# Patient Record
Sex: Male | Born: 1935
Health system: Southern US, Community
[De-identification: ages and names within clinical notes are randomized; demographics above are authoritative.]

## PROBLEM LIST (undated history)

## (undated) DIAGNOSIS — D126 Benign neoplasm of colon, unspecified: Secondary | ICD-10-CM

## (undated) DIAGNOSIS — I2510 Atherosclerotic heart disease of native coronary artery without angina pectoris: Secondary | ICD-10-CM

## (undated) DIAGNOSIS — N200 Calculus of kidney: Secondary | ICD-10-CM

## (undated) DIAGNOSIS — K589 Irritable bowel syndrome without diarrhea: Secondary | ICD-10-CM

## (undated) DIAGNOSIS — I701 Atherosclerosis of renal artery: Secondary | ICD-10-CM

## (undated) DIAGNOSIS — H8109 Meniere's disease, unspecified ear: Secondary | ICD-10-CM

## (undated) DIAGNOSIS — E785 Hyperlipidemia, unspecified: Secondary | ICD-10-CM

## (undated) DIAGNOSIS — H9190 Unspecified hearing loss, unspecified ear: Secondary | ICD-10-CM

## (undated) DIAGNOSIS — M1990 Unspecified osteoarthritis, unspecified site: Secondary | ICD-10-CM

## (undated) DIAGNOSIS — F411 Generalized anxiety disorder: Secondary | ICD-10-CM

## (undated) DIAGNOSIS — I739 Peripheral vascular disease, unspecified: Secondary | ICD-10-CM

## (undated) DIAGNOSIS — I219 Acute myocardial infarction, unspecified: Secondary | ICD-10-CM

## (undated) DIAGNOSIS — I779 Disorder of arteries and arterioles, unspecified: Secondary | ICD-10-CM

## (undated) DIAGNOSIS — I10 Essential (primary) hypertension: Secondary | ICD-10-CM

## (undated) DIAGNOSIS — I679 Cerebrovascular disease, unspecified: Secondary | ICD-10-CM

## (undated) DIAGNOSIS — R011 Cardiac murmur, unspecified: Secondary | ICD-10-CM

## (undated) DIAGNOSIS — R600 Localized edema: Secondary | ICD-10-CM

## (undated) DIAGNOSIS — G4733 Obstructive sleep apnea (adult) (pediatric): Secondary | ICD-10-CM

## (undated) DIAGNOSIS — IMO0001 Reserved for inherently not codable concepts without codable children: Secondary | ICD-10-CM

## (undated) HISTORY — PX: NM MYOVIEW LTD: HXRAD82

## (undated) HISTORY — DX: Obstructive sleep apnea (adult) (pediatric): G47.33

## (undated) HISTORY — PX: CYSTOSCOPY WITH URETEROSCOPY, STONE BASKETRY AND STENT PLACEMENT: SHX6378

## (undated) HISTORY — DX: Cerebrovascular disease, unspecified: I67.9

## (undated) HISTORY — DX: Essential (primary) hypertension: I10

## (undated) HISTORY — PX: CORONARY ANGIOPLASTY: SHX604

## (undated) HISTORY — DX: Unspecified hearing loss, unspecified ear: H91.90

## (undated) HISTORY — DX: Generalized anxiety disorder: F41.1

## (undated) HISTORY — PX: TYMPANOSTOMY TUBE PLACEMENT: SHX32

## (undated) HISTORY — DX: Reserved for inherently not codable concepts without codable children: IMO0001

## (undated) HISTORY — DX: Calculus of kidney: N20.0

## (undated) HISTORY — PX: TONSILLECTOMY: SUR1361

## (undated) HISTORY — PX: CARDIAC CATHETERIZATION: SHX172

## (undated) HISTORY — PX: CORONARY ANGIOPLASTY WITH STENT PLACEMENT: SHX49

## (undated) HISTORY — DX: Disorder of arteries and arterioles, unspecified: I77.9

## (undated) HISTORY — DX: Peripheral vascular disease, unspecified: I73.9

## (undated) HISTORY — DX: Hyperlipidemia, unspecified: E78.5

## (undated) HISTORY — DX: Benign neoplasm of colon, unspecified: D12.6

## (undated) HISTORY — DX: Irritable bowel syndrome, unspecified: K58.9

## (undated) HISTORY — DX: Localized edema: R60.0

## (undated) HISTORY — DX: Atherosclerosis of renal artery: I70.1

---

## 1998-07-06 ENCOUNTER — Ambulatory Visit (HOSPITAL_COMMUNITY): Admission: RE | Admit: 1998-07-06 | Discharge: 1998-07-06 | Payer: Self-pay | Admitting: Specialist

## 1998-07-06 ENCOUNTER — Encounter: Payer: Self-pay | Admitting: Specialist

## 1999-10-03 DIAGNOSIS — I219 Acute myocardial infarction, unspecified: Secondary | ICD-10-CM

## 1999-10-03 HISTORY — DX: Acute myocardial infarction, unspecified: I21.9

## 2000-01-13 ENCOUNTER — Inpatient Hospital Stay (HOSPITAL_COMMUNITY): Admission: EM | Admit: 2000-01-13 | Discharge: 2000-01-18 | Payer: Self-pay | Admitting: Emergency Medicine

## 2000-01-13 ENCOUNTER — Encounter: Payer: Self-pay | Admitting: Cardiology

## 2000-01-31 ENCOUNTER — Encounter (HOSPITAL_COMMUNITY): Admission: RE | Admit: 2000-01-31 | Discharge: 2000-02-27 | Payer: Self-pay | Admitting: Cardiovascular Disease

## 2003-07-13 ENCOUNTER — Inpatient Hospital Stay (HOSPITAL_COMMUNITY): Admission: EM | Admit: 2003-07-13 | Discharge: 2003-07-15 | Payer: Self-pay | Admitting: Emergency Medicine

## 2003-07-13 ENCOUNTER — Encounter: Payer: Self-pay | Admitting: Emergency Medicine

## 2003-07-30 ENCOUNTER — Observation Stay (HOSPITAL_COMMUNITY): Admission: EM | Admit: 2003-07-30 | Discharge: 2003-08-01 | Payer: Self-pay | Admitting: Emergency Medicine

## 2003-10-31 ENCOUNTER — Emergency Department (HOSPITAL_COMMUNITY): Admission: AD | Admit: 2003-10-31 | Discharge: 2003-10-31 | Payer: Self-pay | Admitting: Family Medicine

## 2005-01-13 ENCOUNTER — Emergency Department (HOSPITAL_COMMUNITY): Admission: EM | Admit: 2005-01-13 | Discharge: 2005-01-13 | Payer: Self-pay | Admitting: Emergency Medicine

## 2005-10-04 ENCOUNTER — Ambulatory Visit: Payer: Self-pay | Admitting: Pulmonary Disease

## 2005-11-03 ENCOUNTER — Ambulatory Visit: Payer: Self-pay | Admitting: Gastroenterology

## 2005-11-14 ENCOUNTER — Ambulatory Visit: Payer: Self-pay | Admitting: Gastroenterology

## 2007-01-14 ENCOUNTER — Ambulatory Visit: Payer: Self-pay | Admitting: Pulmonary Disease

## 2007-07-12 ENCOUNTER — Encounter (INDEPENDENT_AMBULATORY_CARE_PROVIDER_SITE_OTHER): Payer: Self-pay | Admitting: Otolaryngology

## 2007-07-12 ENCOUNTER — Ambulatory Visit (HOSPITAL_COMMUNITY): Admission: RE | Admit: 2007-07-12 | Discharge: 2007-07-12 | Payer: Self-pay | Admitting: Otolaryngology

## 2007-11-03 ENCOUNTER — Encounter: Admission: RE | Admit: 2007-11-03 | Discharge: 2007-11-03 | Payer: Self-pay | Admitting: Otolaryngology

## 2008-01-03 ENCOUNTER — Encounter: Payer: Self-pay | Admitting: Pulmonary Disease

## 2008-01-30 DIAGNOSIS — I10 Essential (primary) hypertension: Secondary | ICD-10-CM | POA: Insufficient documentation

## 2008-01-30 DIAGNOSIS — K589 Irritable bowel syndrome without diarrhea: Secondary | ICD-10-CM

## 2008-01-30 DIAGNOSIS — N200 Calculus of kidney: Secondary | ICD-10-CM

## 2008-01-30 DIAGNOSIS — F411 Generalized anxiety disorder: Secondary | ICD-10-CM | POA: Insufficient documentation

## 2008-01-30 DIAGNOSIS — E785 Hyperlipidemia, unspecified: Secondary | ICD-10-CM

## 2008-01-30 DIAGNOSIS — IMO0001 Reserved for inherently not codable concepts without codable children: Secondary | ICD-10-CM

## 2008-01-31 ENCOUNTER — Ambulatory Visit: Payer: Self-pay | Admitting: Internal Medicine

## 2008-02-11 ENCOUNTER — Encounter: Payer: Self-pay | Admitting: Pulmonary Disease

## 2008-04-10 ENCOUNTER — Encounter: Payer: Self-pay | Admitting: Pulmonary Disease

## 2008-06-09 ENCOUNTER — Encounter: Payer: Self-pay | Admitting: Pulmonary Disease

## 2008-07-27 ENCOUNTER — Emergency Department (HOSPITAL_COMMUNITY): Admission: EM | Admit: 2008-07-27 | Discharge: 2008-07-27 | Payer: Self-pay | Admitting: Family Medicine

## 2008-08-09 ENCOUNTER — Observation Stay (HOSPITAL_COMMUNITY): Admission: EM | Admit: 2008-08-09 | Discharge: 2008-08-10 | Payer: Self-pay | Admitting: Emergency Medicine

## 2008-08-11 ENCOUNTER — Encounter: Payer: Self-pay | Admitting: Pulmonary Disease

## 2008-08-28 ENCOUNTER — Ambulatory Visit: Payer: Self-pay | Admitting: Pulmonary Disease

## 2008-08-31 DIAGNOSIS — R42 Dizziness and giddiness: Secondary | ICD-10-CM

## 2008-10-20 ENCOUNTER — Inpatient Hospital Stay (HOSPITAL_COMMUNITY): Admission: EM | Admit: 2008-10-20 | Discharge: 2008-10-22 | Payer: Self-pay | Admitting: Emergency Medicine

## 2008-10-29 ENCOUNTER — Inpatient Hospital Stay (HOSPITAL_COMMUNITY): Admission: EM | Admit: 2008-10-29 | Discharge: 2008-10-30 | Payer: Self-pay | Admitting: Emergency Medicine

## 2008-11-11 ENCOUNTER — Ambulatory Visit: Payer: Self-pay | Admitting: Internal Medicine

## 2008-11-12 ENCOUNTER — Encounter: Payer: Self-pay | Admitting: Pulmonary Disease

## 2008-11-12 ENCOUNTER — Telehealth (INDEPENDENT_AMBULATORY_CARE_PROVIDER_SITE_OTHER): Payer: Self-pay | Admitting: *Deleted

## 2008-11-13 ENCOUNTER — Telehealth (INDEPENDENT_AMBULATORY_CARE_PROVIDER_SITE_OTHER): Payer: Self-pay | Admitting: *Deleted

## 2008-12-08 ENCOUNTER — Encounter: Payer: Self-pay | Admitting: Pulmonary Disease

## 2008-12-10 ENCOUNTER — Encounter: Payer: Self-pay | Admitting: Adult Health

## 2008-12-14 ENCOUNTER — Encounter: Payer: Self-pay | Admitting: Adult Health

## 2008-12-17 ENCOUNTER — Encounter: Payer: Self-pay | Admitting: Adult Health

## 2008-12-17 ENCOUNTER — Encounter: Admission: RE | Admit: 2008-12-17 | Discharge: 2008-12-17 | Payer: Self-pay | Admitting: Otolaryngology

## 2008-12-22 ENCOUNTER — Encounter: Payer: Self-pay | Admitting: Internal Medicine

## 2008-12-22 ENCOUNTER — Ambulatory Visit: Payer: Self-pay | Admitting: Vascular Surgery

## 2008-12-22 ENCOUNTER — Ambulatory Visit (HOSPITAL_COMMUNITY): Admission: RE | Admit: 2008-12-22 | Discharge: 2008-12-22 | Payer: Self-pay | Admitting: Internal Medicine

## 2008-12-22 ENCOUNTER — Ambulatory Visit: Payer: Self-pay | Admitting: Internal Medicine

## 2008-12-24 LAB — CONVERTED CEMR LAB
BUN: 20 mg/dL (ref 6–23)
Calcium: 9.1 mg/dL (ref 8.4–10.5)
Chloride: 105 meq/L (ref 96–112)
GFR calc non Af Amer: 78.44 mL/min (ref >60)
Potassium: 4.4 meq/L (ref 3.5–5.1)
Pro B Natriuretic peptide (BNP): 30 pg/mL (ref 0.0–100.0)
Sodium: 140 meq/L (ref 135–145)
TSH: 3.14 microintl units/mL (ref 0.35–5.50)
Total Bilirubin: 0.9 mg/dL (ref 0.3–1.2)
Total Protein: 7 g/dL (ref 6.0–8.3)

## 2008-12-25 ENCOUNTER — Telehealth (INDEPENDENT_AMBULATORY_CARE_PROVIDER_SITE_OTHER): Payer: Self-pay | Admitting: *Deleted

## 2008-12-28 ENCOUNTER — Telehealth (INDEPENDENT_AMBULATORY_CARE_PROVIDER_SITE_OTHER): Payer: Self-pay | Admitting: *Deleted

## 2008-12-30 ENCOUNTER — Ambulatory Visit: Payer: Self-pay | Admitting: Pulmonary Disease

## 2009-01-02 ENCOUNTER — Telehealth: Payer: Self-pay | Admitting: Adult Health

## 2009-01-14 ENCOUNTER — Telehealth (INDEPENDENT_AMBULATORY_CARE_PROVIDER_SITE_OTHER): Payer: Self-pay | Admitting: *Deleted

## 2009-01-20 ENCOUNTER — Ambulatory Visit: Payer: Self-pay | Admitting: Pulmonary Disease

## 2009-01-20 DIAGNOSIS — I679 Cerebrovascular disease, unspecified: Secondary | ICD-10-CM | POA: Insufficient documentation

## 2009-01-20 DIAGNOSIS — H9190 Unspecified hearing loss, unspecified ear: Secondary | ICD-10-CM | POA: Insufficient documentation

## 2009-01-20 DIAGNOSIS — I2510 Atherosclerotic heart disease of native coronary artery without angina pectoris: Secondary | ICD-10-CM

## 2009-01-20 DIAGNOSIS — I739 Peripheral vascular disease, unspecified: Secondary | ICD-10-CM

## 2009-01-20 DIAGNOSIS — I779 Disorder of arteries and arterioles, unspecified: Secondary | ICD-10-CM | POA: Insufficient documentation

## 2009-01-20 DIAGNOSIS — D126 Benign neoplasm of colon, unspecified: Secondary | ICD-10-CM

## 2009-01-28 ENCOUNTER — Telehealth (INDEPENDENT_AMBULATORY_CARE_PROVIDER_SITE_OTHER): Payer: Self-pay | Admitting: *Deleted

## 2009-02-08 ENCOUNTER — Telehealth: Payer: Self-pay | Admitting: Pulmonary Disease

## 2009-02-09 ENCOUNTER — Encounter: Payer: Self-pay | Admitting: Pulmonary Disease

## 2009-03-05 ENCOUNTER — Telehealth (INDEPENDENT_AMBULATORY_CARE_PROVIDER_SITE_OTHER): Payer: Self-pay | Admitting: *Deleted

## 2009-03-08 ENCOUNTER — Encounter: Payer: Self-pay | Admitting: Pulmonary Disease

## 2009-03-16 ENCOUNTER — Ambulatory Visit: Payer: Self-pay | Admitting: Pulmonary Disease

## 2009-03-18 ENCOUNTER — Telehealth (INDEPENDENT_AMBULATORY_CARE_PROVIDER_SITE_OTHER): Payer: Self-pay | Admitting: *Deleted

## 2009-03-23 LAB — CONVERTED CEMR LAB
Calcium: 9.1 mg/dL (ref 8.4–10.5)
Creatinine, Ser: 0.9 mg/dL (ref 0.4–1.5)
GFR calc non Af Amer: 88.52 mL/min (ref >60)
Glucose, Bld: 93 mg/dL (ref 70–99)
Potassium: 3.9 meq/L (ref 3.5–5.1)
Pro B Natriuretic peptide (BNP): 44 pg/mL (ref 0.0–100.0)
Sodium: 140 meq/L (ref 135–145)

## 2009-04-26 ENCOUNTER — Telehealth (INDEPENDENT_AMBULATORY_CARE_PROVIDER_SITE_OTHER): Payer: Self-pay | Admitting: *Deleted

## 2009-05-10 ENCOUNTER — Encounter: Payer: Self-pay | Admitting: Adult Health

## 2009-05-19 ENCOUNTER — Ambulatory Visit: Payer: Self-pay | Admitting: Pulmonary Disease

## 2009-06-10 ENCOUNTER — Ambulatory Visit: Payer: Self-pay | Admitting: Endocrinology

## 2009-06-21 ENCOUNTER — Encounter: Payer: Self-pay | Admitting: Endocrinology

## 2009-06-23 ENCOUNTER — Telehealth: Payer: Self-pay | Admitting: Endocrinology

## 2009-06-26 LAB — CONVERTED CEMR LAB
Catecholamines Tot(E+NE) 24 Hr U: 0.232 mg/24hr — ABNORMAL HIGH
Metanephrines, Ur: 290 (ref 90–315)
Norepinephrine 24 Hr Urine: 214 mcg/24hr — ABNORMAL HIGH (ref <80)

## 2009-07-19 ENCOUNTER — Telehealth (INDEPENDENT_AMBULATORY_CARE_PROVIDER_SITE_OTHER): Payer: Self-pay | Admitting: *Deleted

## 2009-07-23 ENCOUNTER — Ambulatory Visit: Payer: Self-pay | Admitting: Internal Medicine

## 2009-08-04 ENCOUNTER — Encounter: Admission: RE | Admit: 2009-08-04 | Discharge: 2009-08-24 | Payer: Self-pay | Admitting: Pulmonary Disease

## 2009-08-12 ENCOUNTER — Encounter: Payer: Self-pay | Admitting: Internal Medicine

## 2009-08-17 ENCOUNTER — Encounter (HOSPITAL_COMMUNITY): Admission: RE | Admit: 2009-08-17 | Discharge: 2009-10-01 | Payer: Self-pay | Admitting: Internal Medicine

## 2009-08-23 ENCOUNTER — Ambulatory Visit: Payer: Self-pay | Admitting: Pulmonary Disease

## 2009-08-24 ENCOUNTER — Encounter: Payer: Self-pay | Admitting: Pulmonary Disease

## 2009-09-02 ENCOUNTER — Encounter: Payer: Self-pay | Admitting: Pulmonary Disease

## 2009-09-21 ENCOUNTER — Telehealth (INDEPENDENT_AMBULATORY_CARE_PROVIDER_SITE_OTHER): Payer: Self-pay | Admitting: *Deleted

## 2009-10-13 ENCOUNTER — Encounter: Payer: Self-pay | Admitting: Endocrinology

## 2009-12-02 ENCOUNTER — Encounter: Payer: Self-pay | Admitting: Endocrinology

## 2009-12-02 ENCOUNTER — Ambulatory Visit: Payer: Self-pay | Admitting: Pulmonary Disease

## 2009-12-03 ENCOUNTER — Telehealth (INDEPENDENT_AMBULATORY_CARE_PROVIDER_SITE_OTHER): Payer: Self-pay | Admitting: *Deleted

## 2009-12-03 LAB — CONVERTED CEMR LAB
ALT: 45 units/L (ref 0–53)
Basophils Absolute: 0 10*3/uL (ref 0.0–0.1)
Basophils Relative: 0 % (ref 0–1)
Bilirubin, Direct: 0.3 mg/dL (ref 0.0–0.3)
CO2: 22 meq/L (ref 19–32)
Calcium: 8.6 mg/dL (ref 8.4–10.5)
Creatinine, Ser: 1.25 mg/dL (ref 0.40–1.50)
Glucose, Bld: 117 mg/dL — ABNORMAL HIGH (ref 70–99)
Hemoglobin: 16.1 g/dL (ref 13.0–17.0)
MCHC: 35.5 g/dL (ref 30.0–36.0)
MCV: 89.5 fL (ref 78.0–100.0)
Monocytes Relative: 13 % — ABNORMAL HIGH (ref 3–12)
Neutrophils Relative %: 58 % (ref 43–77)
Potassium: 3.8 meq/L (ref 3.5–5.3)
RBC: 5.06 M/uL (ref 4.22–5.81)
RDW: 12.5 % (ref 11.5–15.5)
Total Bilirubin: 1.3 mg/dL — ABNORMAL HIGH (ref 0.3–1.2)
Total Protein: 7.3 g/dL (ref 6.0–8.3)

## 2009-12-30 IMAGING — CT CT PELVIS W/ CM
2 of 5 series · 17 of 46 positions shown, 19 images · IV contrast (READICAT/WATER & [ID] OMNI 300)
Comparison: None

CT ABDOMEN

CLINICAL DATA: Dizziness and elevated type:  Levels.  Evaluate for
pheochromocytoma.

CT ABDOMEN AND PELVIS WITH CONTRAST
TECHNIQUE: Multidetector CT imaging of the abdomen and pelvis was
performed using the standard protocol following bolus
administration of intravenous contrast.
Contrast: 100 ml Jmnipaque-W66 and oral contrast

[Series 2: abdomen w/ · axial · 0.76mm/px · z∈[-343,+67]mm · 14 of 92 slices shown, 16 images]
[im 5/92  soft-tissue]
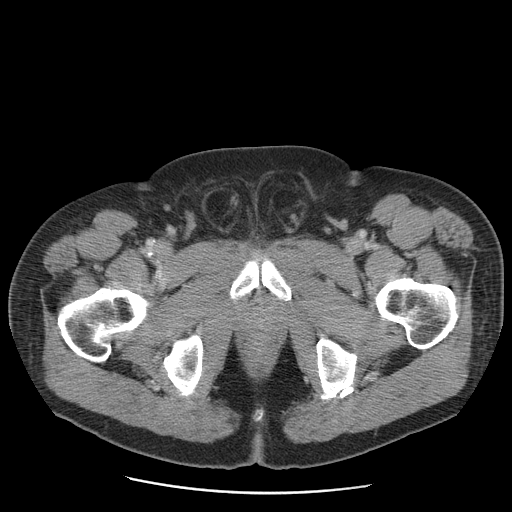
[im 5/92  bone]
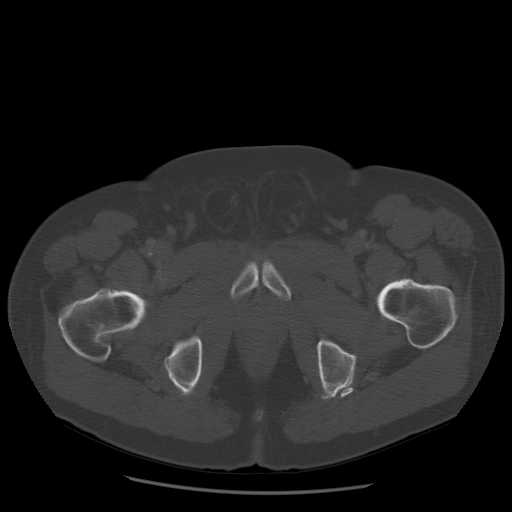
[im 10/92  soft-tissue]
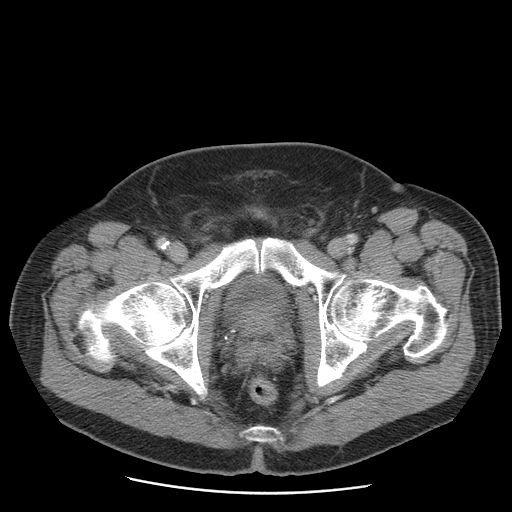
[im 20/92  soft-tissue]
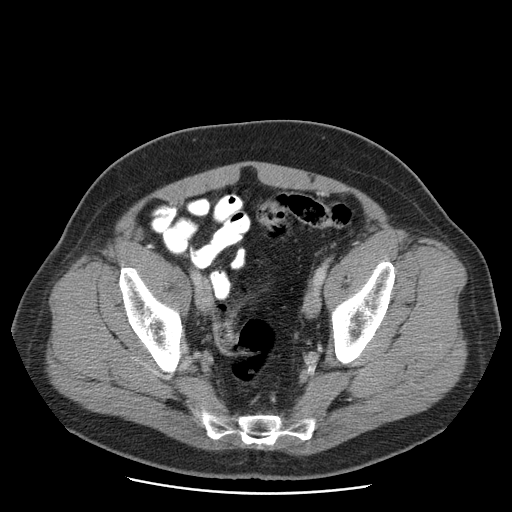
[im 24/92  soft-tissue]
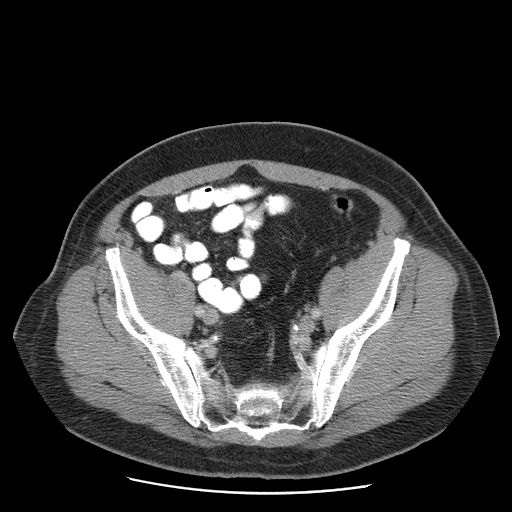
[im 29/92  soft-tissue]
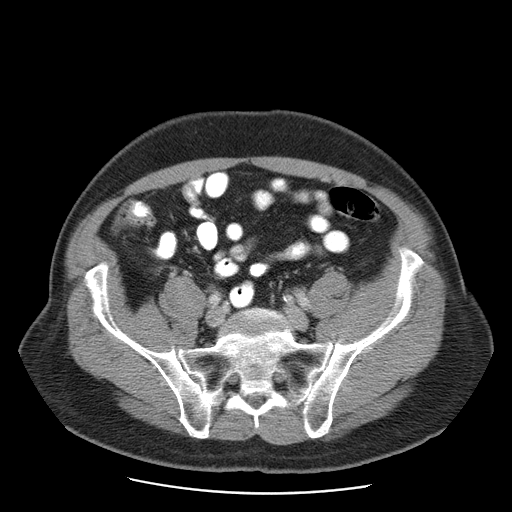
[im 39/92  soft-tissue]
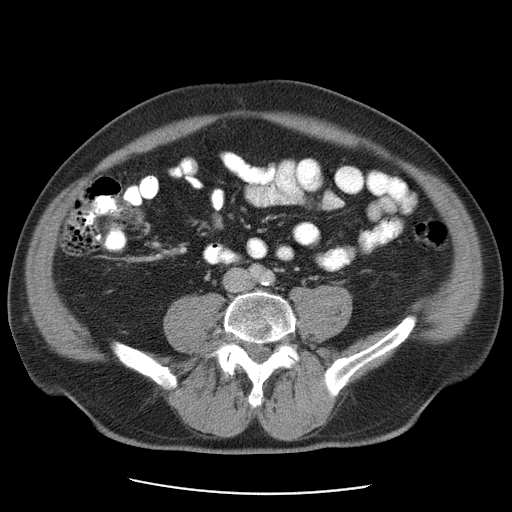
[im 44/92  soft-tissue]
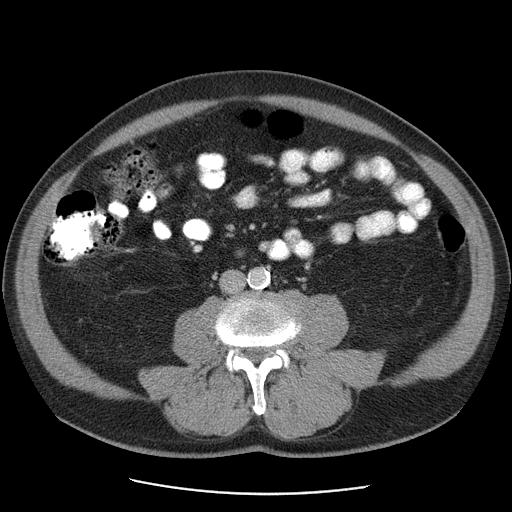
[im 48/92  soft-tissue]
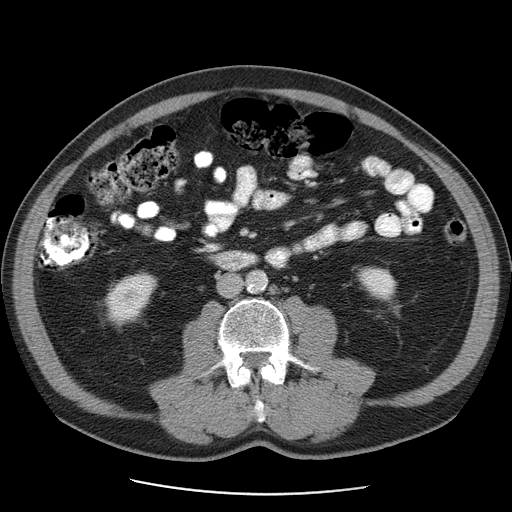
[im 53/92  soft-tissue]
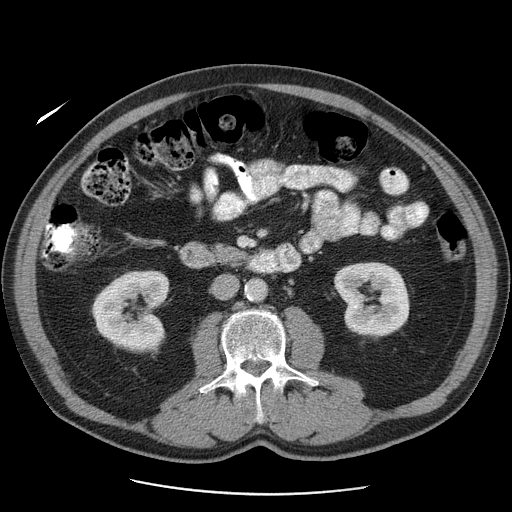
[im 53/92  bone]
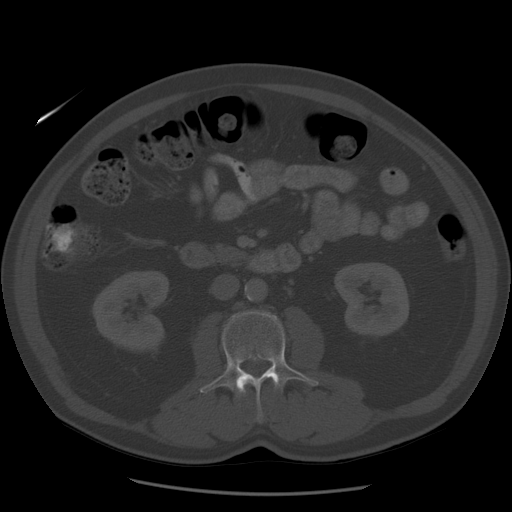
[im 63/92  soft-tissue]
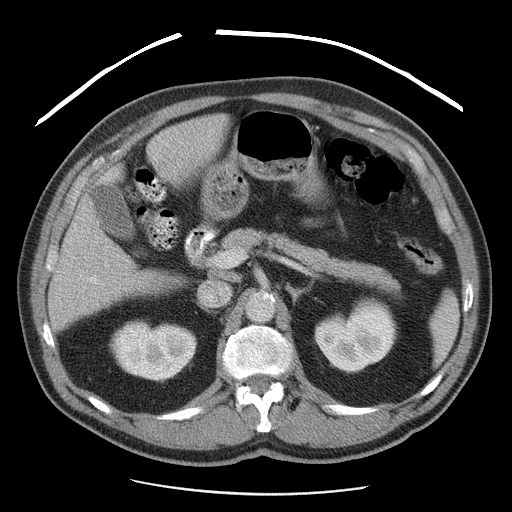
[im 68/92  soft-tissue]
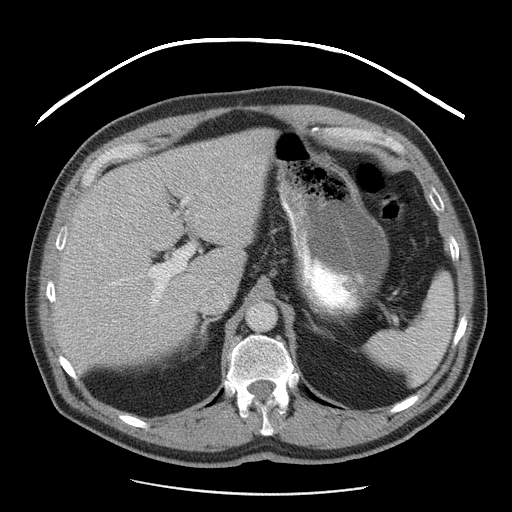
[im 72/92  soft-tissue]
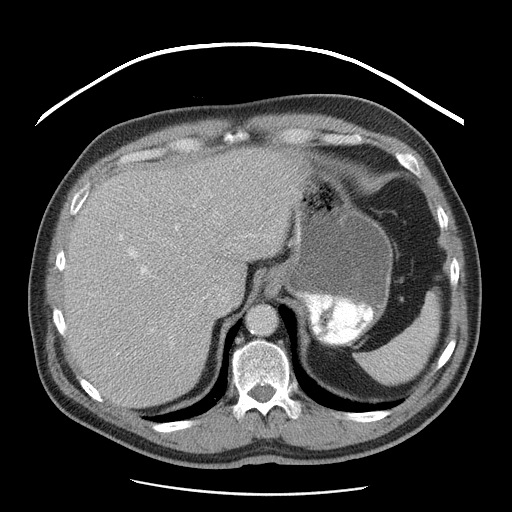
[im 82/92  soft-tissue]
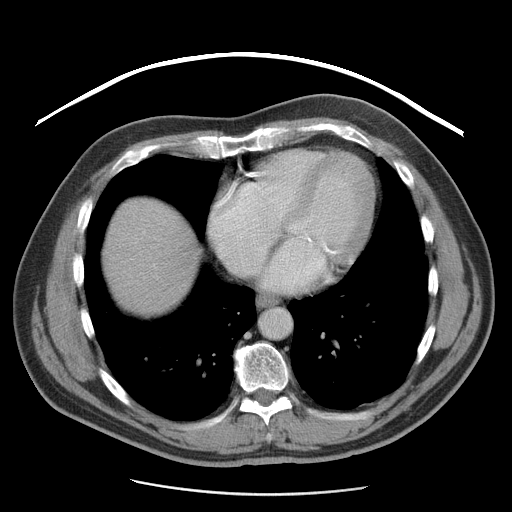
[im 87/92  soft-tissue]
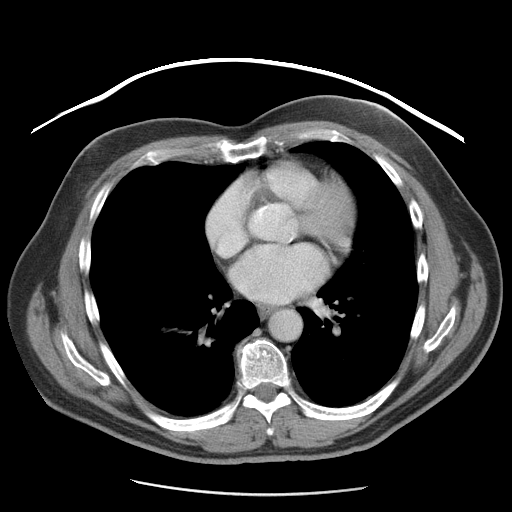

[Series 400: cor abd · coronal · 0.96mm/px · 3 of 143 slices shown]
[im 48/143  soft-tissue]
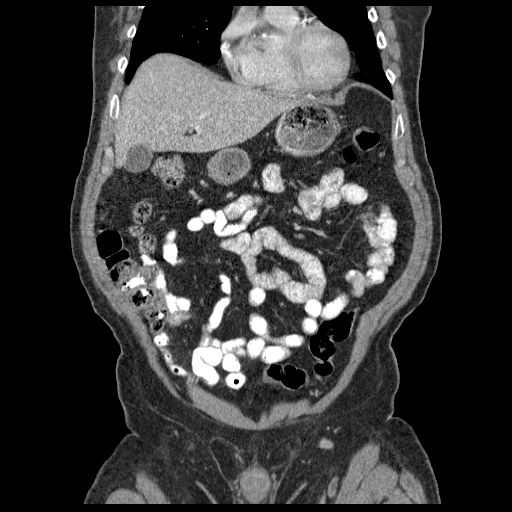
[im 64/143  soft-tissue]
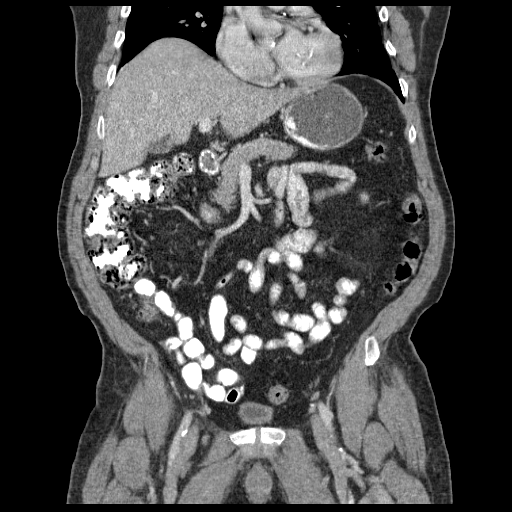
[im 79/143  soft-tissue]
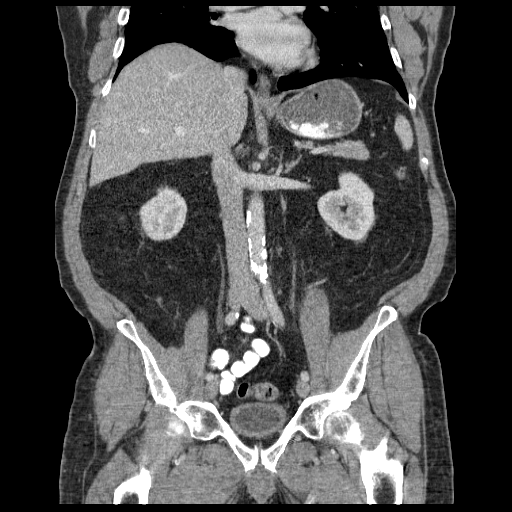

[17 of 46 positions shown; findings below may reference images not displayed]

FINDINGS: Both adrenal glands are normal in size in appearance.
There is no evidence of adrenal mass or abnormal adrenal
hypertrophy.  No other retroperitoneal soft tissue masses are
identified.  There is no evidence of lymphadenopathy.

The liver, gallbladder, pancreas, spleen, and kidneys are normal in
appearance.  There is no evidence of soft tissue masses or
inflammatory process.  No abnormal fluid collections are seen.
Abdominal bowel loops are unremarkable in appearance.
IMPRESSION: Negative abdomen CT.  No evidence of adrenal or other
retroperitoneal mass.

CT PELVIS
FINDINGS: There is no evidence of retroperitoneal mass within the
pelvis.  No other pelvic soft tissue masses are identified.
Urinary bladder is unremarkable appearance.  There is no evidence
of lymphadenopathy.

No inflammatory process or abnormal fluid collections are
identified.  Pelvic bowel loops are unremarkable in appearance.
IMPRESSION: Negative pelvis CT.  No evidence of retroperitoneal or other pelvic
mass.

## 2010-03-15 ENCOUNTER — Telehealth: Payer: Self-pay | Admitting: Pulmonary Disease

## 2010-03-17 ENCOUNTER — Ambulatory Visit: Payer: Self-pay | Admitting: Pulmonary Disease

## 2010-03-23 ENCOUNTER — Ambulatory Visit: Payer: Self-pay | Admitting: Pulmonary Disease

## 2010-03-23 LAB — CONVERTED CEMR LAB
AST: 40 units/L — ABNORMAL HIGH (ref 0–37)
Albumin: 3.8 g/dL (ref 3.5–5.2)
Bilirubin, Direct: 0.2 mg/dL (ref 0.0–0.3)
Chloride: 102 meq/L (ref 96–112)
Cholesterol: 166 mg/dL (ref 0–200)
Creatinine, Ser: 1 mg/dL (ref 0.4–1.5)
Eosinophils Relative: 5.5 % — ABNORMAL HIGH (ref 0.0–5.0)
GFR calc non Af Amer: 80.01 mL/min (ref >60)
HCT: 40.7 % (ref 39.0–52.0)
Hemoglobin: 14.3 g/dL (ref 13.0–17.0)
Lymphs Abs: 2.6 10*3/uL (ref 0.7–4.0)
MCV: 93.9 fL (ref 78.0–100.0)
Monocytes Absolute: 0.8 10*3/uL (ref 0.1–1.0)
Monocytes Relative: 9.3 % (ref 3.0–12.0)
PSA: 0.16 ng/mL (ref 0.10–4.00)
Platelets: 291 10*3/uL (ref 150.0–400.0)
RDW: 12.8 % (ref 11.5–14.6)
Sodium: 139 meq/L (ref 135–145)
Total Bilirubin: 1.6 mg/dL — ABNORMAL HIGH (ref 0.3–1.2)
Triglycerides: 425 mg/dL — ABNORMAL HIGH (ref 0.0–149.0)
WBC: 8.9 10*3/uL (ref 4.5–10.5)

## 2010-04-26 ENCOUNTER — Encounter: Payer: Self-pay | Admitting: Pulmonary Disease

## 2010-09-06 ENCOUNTER — Encounter: Payer: Self-pay | Admitting: Pulmonary Disease

## 2010-10-26 ENCOUNTER — Encounter: Payer: Self-pay | Admitting: Gastroenterology

## 2010-11-03 NOTE — Miscellaneous (Signed)
Summary: Initial Assessment for PT Services/[facility]  Initial Assessment for PT Services/[facility]   Imported By: Maryln Gottron 10/05/2009 10:53:40  _____________________________________________________________________  External Attachment:    Type:   Image     Comment:   External Document

## 2010-11-03 NOTE — Letter (Signed)
Summary: [facility]  [facility]   Imported By: Lennie Odor 05/03/2010 14:08:23  _____________________________________________________________________  External Attachment:    Type:   Image     Comment:   External Document

## 2010-11-03 NOTE — Progress Notes (Signed)
Summary: LABS  Phone Note Call from Patient Call back at [phone]   Caller: Patient Call For: Allani Reber Summary of Call: NEED LABS FOR CPX PUT IN . PT WOULD LIKE TO COME IN THIS WEEK TO HAVE THEM DONE. Initial call taken by: Rickard Patience,  March 15, 2010 9:50 AM  Follow-up for Phone Call        Pt has appt for cpx with SN for 6/22 at 3:30 pm.  Please advise if you want him to come in sooner for fasting labs, thanks! Follow-up by: Vernie Murders,  March 15, 2010 9:56 AM  Additional Follow-up for Phone Call Additional follow up Details #1::        ok per SN for labs prior to appt.---these have been put in the computer for pt for this week. pt is aware Randell Loop CMA  March 15, 2010 11:23 AM

## 2010-11-03 NOTE — Assessment & Plan Note (Signed)
Summary: cpx/ok per [facility]/apc   Primary Care Provider:  Kriste Basque  CC:  14 month ROV & review of mult medical problems....  History of Present Illness: 75 y/o WM here for a follow up visit... he has multiple medical problems as noted below...     ~  Apr10:  I last saw him 4/08 (on that occas for refill of Chlorazepate)... he has mult medical problems as listed below... recently he has had an extensive eval from the Cardiovasc perspective by DrBerry, and from the ENT standpoint from [facility]...    Current Problems:   DIZZINESS (ICD-780.4) & HEARING LOSS (ICD-389.9) - see extensive eval by Rosine Beat 3/10 w/ Audiogram showing high freq loss that was similar to study in 7/09... Labs essent norm x increased catecholamines on 24H urine (?poss related to meds & off Imipramine now)... MRI Brain w/ small vessel disease otherw neg... CT Abd neg- normal adrenals bilat... he was given a course of oral PRED- not much change in hearing & f/u w/ DrKraus > prob Meniere's disease- tube placed in right ear 6/10, started on Aldactazide, uses Meclizine & Valium Prn... he did see DrLove 1/11 who agreed w/ the Dx.  HYPERTENSION (ICD-401.9) - controllled on meds per [facility]= METOPROLOL 50mg - 1.5 tabsBid, & ALDACTAZIDE 25-25 taken Bid... BP= 124/76 & he denies HA, visual symptoms, CP, palpit, syncope, etc... prev mild LE edema resolved w/ the diuretic...  ~  2DEcho 7/09 by [facility] was normal...  CORONARY ARTERY DISEASE (ICD-414.00) - on ASA 81mg /d + PLAVIX 75mg /d... known CAD followed by [facility]= he had both diagonal branch & RCA stents placed in 2004, and f/u cath Jan10 showed patent stents...  ~  Myoview 11/09 by [facility] was negative...  ~  Event recorder as part of his dizziness work up was reported no signif arrhythmias.  CEREBROVASCULAR DISEASE (ICD-437.9) - hx of carotid disease followed by [facility] w/ CDopplers...  ~  CDopplers w/ 60-79% right carotid stenosis... continues on ASA/ Plavix.  PERIPHERAL VASCULAR DISEASE  (ICD-443.9) - known mild RAS followed by ultrasounds at the [facility] office...  ~  Dopplers w/ 40% bilat RAS as noted...  HYPERLIPIDEMIA (ICD-272.4) - on LIPITOR 80mg /d, ZETIA 10mg /d, FISH OIL 1000mg Bid...  ~  FLP's followed by [facility] (we don't have recent numbers to review).  ~  FLP 6/11 showed TChol 166, TG 425, HDL 32, LDL 78... may need fibrate- defer to [facility].  IRRITABLE BOWEL SYNDROME (ICD-564.1) & COLONIC POLYPS (ICD-211.3) - followed for GI by DrStark & last colonoscopy was 2/07 showing mult 3-44mm polyps= tubular adenomas and f/u plasnned 36yrs.  NEPHROLITHIASIS (ICD-592.0) - he is followed for Urology by DrGrapey and the pt tells me that he does his routine PSA screening as well- ?when this was done last?  ~  labs here 6/11 showed PSA= 0.16  FIBROMYALGIA (ICD-729.1) - he has carried this diagnosis for yrs based on clinical symptoms and was treated w/ Imipramine 25mg  Bid until it was discontinued 3/10...  ANXIETY (ICD-300.00) - on DIAZEPAM 5mg - 1/2 to 1 tab by mouth three times a day Prn... he reports a prev reaction to Zoloft.  * Hx of ABNORMAL URINARY CATECHOL/METANEPHRINES - eval by DrKraus, DrEllison, DrLove> CT Abd was neg & MIBG scan was also neg- no adrenal lesions seen... poss related to Imipramine Rx  Health Maintenance - he had a TETANUS shot in 2003... he had PNEUMOVAX in 2007... he also received the SHINGLES vaccine in Oct08... he also takes MVI, Protegra, Vit C...   Preventive Screening-Counseling & Management  Alcohol-Tobacco     Smoking Status: quit     Year Quit: 1966  Allergies: 1)  Zoloft  Comments:  Nurse/Medical Assistant: The patient's medications and allergies were reviewed with the patient and were updated in the Medication and Allergy Lists.  Past History:  Past Medical History: Hx of DIZZINESS (ICD-780.4) HEARING LOSS (ICD-389.9) HYPERTENSION (ICD-401.9) CORONARY ARTERY DISEASE (ICD-414.00) CEREBROVASCULAR DISEASE (ICD-437.9) PERIPHERAL VASCULAR  DISEASE (ICD-443.9) HYPERLIPIDEMIA (ICD-272.4) IRRITABLE BOWEL SYNDROME (ICD-564.1) COLONIC POLYPS (ICD-211.3) NEPHROLITHIASIS (ICD-592.0) FIBROMYALGIA (ICD-729.1) ANXIETY (ICD-300.00) * Hx of ABNORMAL URINARY CATECHOL/METANEPHRINES - believed related to Imipramine Rx  Past Surgical History: S/P tube in right ear 6/10 by DrKraus  Family History: Reviewed history from 05/19/2009 and no changes required. heart disease - 1 brother has defibrillator, father had CHF mother died of stroke lung cancer in 1 brother, sister has breast cancer  Social History: Reviewed history from 12/02/2009 and no changes required. Married former smoker.  quit in 1966 x80yrs, 1ppd. 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  The patient denies anorexia, fever, weight loss, vision loss, decreased hearing, hoarseness, chest pain, syncope, peripheral edema, prolonged cough, headaches, hemoptysis, abdominal pain, melena, hematochezia, severe indigestion/heartburn, hematuria, incontinence, muscle weakness, suspicious skin lesions, transient blindness, difficulty walking, depression, unusual weight change, abnormal bleeding, enlarged lymph nodes, and angioedema.         He notes some breast tissue developement likely related to Aldactazide Rx...  Vital Signs:  Patient profile:   75 year old male Height:      68 inches Weight:      208 pounds BMI:     31.74 O2 Sat:      96 % on Room air Temp:     98.6 degrees F oral Pulse rate:   73 / minute BP sitting:   124 / 76  (right arm) Cuff size:   regular  Vitals Entered By: Randell Loop CMA (March 23, 2010 3:21 PM)  O2 Sat at Rest %:  96 O2 Flow:  Room air CC: 14 month ROV & review of mult medical problems... Is Patient Diabetic? No Pain Assessment Patient in pain? no      Comments no changes in meds today     Physical Exam  Additional Exam:  WD, sl obese, 75 y/o WM in NAD... he has gained 24# to 208#... GENERAL:  Alert & oriented; pleasant & cooperative... HEENT:  Fleming/AT, EOM-wnl, PERRLA, Fundi-benign, EACs-clear, TMs-wnl, NOSE-clear, THROAT-clear & wnl. NECK:  Supple w/ fairROM; no JVD; normal carotid impulses w/o bruits; no thyromegaly or nodules palpated; no lymphadenopathy. CHEST:  Clear to P & A; without wheezes/ rales/ or rhonchi. Bilat mild gynecomastia is present, no galactorrhea... HEART:  Regular Rhythm; without murmurs/ rubs/ or gallops. ABDOMEN:  Soft & nontender; normal bowel sounds; no organomegaly or masses detected. EXT: without deformities, mild arthritic changes; no varicose veins/ venous insuffic/ or edema. NEURO:  CN's intact; motor testing normal; sensory testing normal; gait normal & balance OK. DERM:  No lesions noted; no rash etc...    MISC. Report  Procedure date:  03/17/2010  Findings:      Lipid Panel (LIPID)   Cholesterol               166 mg/dL  0-200   Triglycerides        [H]  425.0 mg/dL                 1.6-109.6   HDL                  [L]  04.54 mg/dL                 >09.81 Cholesterol LDL - Direct                             78.6 mg/dL  BMP (METABOL)   Sodium                    139 mEq/L                   135-145   Potassium                 4.1 mEq/L                   3.5-5.1   Chloride                  102 mEq/L                   96-112   Carbon Dioxide            29 mEq/L                    19-32   Glucose              [H]  109 mg/dL                   19-14   BUN                       22 mg/dL                    7-82   Creatinine                1.0 mg/dL                   9.5-6.2   Calcium                   9.1 mg/dL                   1.3-08.6   GFR                       80.01 mL/min                >60  Hepatic/Liver Function Panel (HEPATIC)   Total Bilirubin      [H]  1.6 mg/dL                   5.7-8.4   Direct Bilirubin           0.2 mg/dL                   6.9-6.2   Alkaline Phosphatase      64 U/L                      39-117   AST                  [  H]  40 U/L                      0-37   ALT                       46 U/L                      0-53   Total Protein             6.7 g/dL                    0.2-5.8   Albumin                   3.8 g/dL                    5.2-7.7  Comments:      CBC Platelet w/Diff (CBCD)   White Cell Count          8.9 K/uL                    4.5-10.5   Red Cell Count            4.33 Mil/uL                 4.22-5.81   Hemoglobin                14.3 g/dL                   82.4-23.5   Hematocrit                40.7 %                      39.0-52.0   MCV                       93.9 fl                     78.0-100.0   Platelet Count            291.0 K/uL                  150.0-400.0   Neutrophil %              55.1 %                      43.0-77.0   Lymphocyte %              29.6 %                      12.0-46.0   Monocyte %                9.3 %                       3.0-12.0   Eosinophils%         [H]  5.5 %                       0.0-5.0   Basophils %               0.5 %  0.0-3.0  TSH (TSH)   FastTSH                   1.92 uIU/mL                 0.35-5.50  Prostate Specific Antigen (PSA)   PSA-Hyb                   0.16 ng/mL                  0.10-4.00   CXR  Procedure date:  03/23/2010  Findings:      CHEST - 2 VIEW Comparison: 10/19/2008 and earlier studies   Findings: Lungs clear.  Heart size and pulmonary vascularity normal.  No effusion.  Visualized bones unremarkable.   IMPRESSION: No acute disease   Read By:  Corlis Leak. Reuel Boom,  M.D.    Impression & Recommendations:  Problem # 1:  PHYSICAL EXAMINATION (ICD-V70.0) Recent labs reviewed w/ pt & wife today... CXR done> clear, WNL...  T-2 View CXR (71020TC)  Problem # 2:  DIZZINESS (ICD-780.4) Improved overall...  His updated medication list for this problem includes:    Meclizine  Hcl 25 Mg Tabs (Meclizine hcl) .Marland Kitchen... 1 every 8 hours as needed for dizziness  Problem # 3:  HYPERTENSION (ICD-401.9) Controlled on meds>  but may need to switch off Aldactazide due to gynecomastia... he will discuss w/ DrBerry at ntheir up coming OV. His updated medication list for this problem includes:    Metoprolol Tartrate 50 Mg Tabs (Metoprolol tartrate) .Marland Kitchen... 1.5 tabs by mouth two times a day    Aldactazide 25-25 Mg Tabs (Spironolactone-hctz) .Marland Kitchen... 1 by mouth two times a day  Problem # 4:  CORONARY ARTERY DISEASE (ICD-414.00) Aware>  he appears stable & denies angina, working security job part time... His updated medication list for this problem includes:    Adult Aspirin Low Strength 81 Mg Tbdp (Aspirin) .Marland Kitchen... Take 1 by mouth once daily    Plavix 75 Mg Tabs (Clopidogrel bisulfate) .Marland Kitchen... Take 1 by mouth once daily    Metoprolol Tartrate 50 Mg Tabs (Metoprolol tartrate) .Marland Kitchen... 1.5 tabs by mouth two times a day    Aldactazide 25-25 Mg Tabs (Spironolactone-hctz) .Marland Kitchen... 1 by mouth two times a day  Problem # 5:  CEREBROVASCULAR DISEASE (ICD-437.9) Followed by [facility] w/ serial CDopplers...  Problem # 6:  HYPERLIPIDEMIA (ICD-272.4) FLP shows incr TG in assoc w/ 24# weight gain... on Lip + Zetia & may need meds adjusted but for now low fat weight reducing diet & incr exercise discussed. His updated medication list for this problem includes:    Lipitor 80 Mg Tabs (Atorvastatin calcium) .Marland Kitchen... Take 1 by mouth once daily    Zetia 10 Mg Tabs (Ezetimibe) .Marland Kitchen... Take 1 by mouth once daily  Problem # 7:  COLONIC POLYPS (ICD-211.3) GI is stable & f/u colon due 2/12 by DrStark.  Problem # 8:  ANXIETY (ICD-300.00) He continues on the Valium for Meniere's and anxiety... His updated medication list for this problem includes:    Diazepam 5 Mg Tabs (Diazepam) .Marland Kitchen... 1/2 - 1 tabs by mouth three times a day as needed  Problem # 9:  OTHER MEDICAL PROBLEMS AS NOTED>>> He has some gynecomastia likely related  to the Aldactazide but doesn't want to stop until he's had a chance to discuss w/ DrBerry.  Complete Medication List: 1)  Meclizine Hcl 25 Mg Tabs (Meclizine hcl) .Marland Kitchen.. 1 every 8 hours as needed for  dizziness 2)  Adult Aspirin Low Strength 81 Mg Tbdp (Aspirin) .... Take 1 by mouth once daily 3)  Plavix 75 Mg Tabs (Clopidogrel bisulfate) .... Take 1 by mouth once daily 4)  Metoprolol Tartrate 50 Mg Tabs (Metoprolol tartrate) .... 1.5 tabs by mouth two times a day 5)  Aldactazide 25-25 Mg Tabs (Spironolactone-hctz) .Marland Kitchen.. 1 by mouth two times a day 6)  Lipitor 80 Mg Tabs (Atorvastatin calcium) .... Take 1 by mouth once daily 7)  Zetia 10 Mg Tabs (Ezetimibe) .... Take 1 by mouth once daily 8)  Fish Oil 1000 Mg Caps (Omega-3 fatty acids) .... Take 1 capsule by mouth two times a day 9)  Prevacid 15 Mg Cpdr (Lansoprazole) .... Take 1 capsule by mouth once a day 10)  Multivitamins Tabs (Multiple vitamin) .... Take 1 by mouth once daily 11)  Protegra Caps (Multiple vitamins-minerals) .... Take 1 by mouth once daily 12)  Vitamin C 1000 Mg Tabs (Ascorbic acid) .... Take 1 by mouth two times a day 13)  Diazepam 5 Mg Tabs (Diazepam) .... 1/2 - 1 tabs by mouth three times a day as needed  Patient Instructions: 1)  Today we updated your med list- see below.... 2)  Today we reviewed your recent fasting blood work, and we did your f/u CXR.Marland KitchenMarland Kitchen 3)  please call the "phone tree" in a few days for your XRay results.Marland KitchenMarland Kitchen 4)  Call for any questions.Marland KitchenMarland Kitchen 5)  Please schedule a follow-up appointment in 1 year, sooner as needed.   Immunization History:  Influenza Immunization History:    Influenza:  historical (07/07/2009)

## 2010-11-03 NOTE — Letter (Signed)
Summary: Colonoscopy Letter  [facility]  [address] [place], [place] [address]   Phone: [phone]  Fax: [phone]      October 26, 2010 MRN: [id number]   [facility] [address] [place], [place]  [address]   Dear Mr. Plyler,   According to your medical record, it is time for you to schedule a Colonoscopy. The American Cancer Society recommends this procedure as a method to detect early colon cancer. Patients with a family history of colon cancer, or a personal history of colon polyps or inflammatory bowel disease are at increased risk.  This letter has been generated based on the recommendations made at the time of your procedure. If you feel that in your particular situation this may no longer apply, please contact our office.  Please call our office at [phone] to schedule this appointment or to update your records at your earliest convenience.  Thank you for cooperating with [place] to provide you with the very best care possible.   Sincerely,  Judie Petit T. Russella Dar, M.D.  [facility] [phone]

## 2010-11-03 NOTE — Assessment & Plan Note (Signed)
Summary: Acute NP office visit - n/d   Copy to:  Gini Caputo Primary Provider/Referring Provider:  Kriste Basque  CC:  was started on zoloft at last OV, after 3 nights became disoriented so pt dc'd and went back on diazepam.  also c/o weakness, no energy, and nausea/diarrhea x4days - denies f/c/s.Eric [name]  History of Present Illness: 75 year old male with known history of multiple medical conditions including,  HTN, CAD Hyperlipidemia, IBS    08/28/08--saw cardiology 08/09/08 for "hot" spells, dizziness/lightheaded; with negative work up. episode of feeling hot lightheaded diaphoresis, nausea --went to ER--admitted neg w/u card w/u neg with cardiolite.Eric [name] Has had no further episodes since    November 11, 2008--returns for follow up. pt states this is to refer him to Dr. Dorma Russell for dizziness.  Pt has been hospitalized twice in last month 1/19-21 and 10/29/2008-  10/30/2008 for Dizziness of undetermined etiology w/ presyncopal episodes.  Recently, has been admitted for evaluation of intermittent dizziness.  He was just discharged on October 22, 2008, after a syncopal spell associated with sinus tachycardia.  His symptoms seemed to improve with meclizine as an outpatient.  He had another episode at home on October 29, 2008.  He had a  a monitor on, but unfortunately, it is a self-activated monitor and he did not activated.  He was admitted to telemetry for further evaluation.  His enzymes are negative.  Since discharge still has episodes of lightheadness. no syncopal episodes. He is only using meclizine  a few times. No headache, visual/speech changes. pt underwent Cardiac catheterization revealed stable patent stents, EF 60%.   December 22, 2008 new problem foot/leg swelling  right then left x 1 week on plendil (though didn't disclose it, gets it from cards).  --venous doppler neg.  December 30, 2008--Returns for follow up of dizzines and discuss med changes. Has been seeing Dr. Jac Canavan who has done an extensive work up.  Spoke w/ Dr. Cathe Mons concerning his workup Has undergone MRI head w/o acute changes, chronic vessel dz. Pt b/p was elevated. Pt was sent for 24 hr urine for catecholamine and catecholamine levels which were elevated. Epinephrine 864 (nl-<420) total catecholamines 911 (nl-<504). 24 hr urine norepinephrine elevated 175 (nl <80). Sent for CT abd/pelvis neg for adrenal mass. Using meclizine w/ some relief but has had 2 episodes since last over last 2 weeks w/ severe dizziness, weakness to point he is unable to function. Has vertigo test set up for next weeks. Discussed possible med side effects w/ Dr. Jac Canavan w/ imipramine potential for dizziness and catecholamine rise. Suggested to taper off of slowly. Continue on diuretic. 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 Complains of increased edema in legs and feet x1week. Blood pressure on upper limits.  Underwent ear surgery on right w/ significantly improved. He is also on meclizine, and valium for dizziness. He has had no episodes of dizziness since surgery. Is scheduled for echo of heart in few weeks. Had hx chronic edema on diurectics. Had venous doppler 11/2008 which was neg for DVT-aldactazide increased.    May 19, 2009--Presents for follow up of abnormal labs , elevated urinary catecholamine levels- persist. Seen w/  extensive eval  Dr. Jac Canavan, prev. catecholamine elevated, see above, had repeat 24 hr urine few weeks ago, remain elevated. Prev CT neg for adrenal mass. b/p has been stable on rx ... His dizziness has remained improved since ear tubes few months ago. Still has occasional  episode but resolves. Denies chest pain, dyspnea, orthopnea, hemoptysis, fever, n/v/d, increased edema, headache,palpitations,  July 23, 2009--Presents for folloiw up . 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 Still with intermittent symptoms. Uses valium for anxiety and dizziness. Dr. Cathe Mons would like to  be able to wean off valium. He has hx of chronic anxiety, previoulsy taken off of imipramine several months ago. Worries some, intermittent depression , trouble sleeping at times. Denies chest pain, dyspnea, orthopnea, hemoptysis, fever, n/v/d, edema, headache.   August 23, 2009 -Returns for follow up. Has weaned off valium, using lexapro, Some improvement in mood. Complains that he has chronic sleep trouble. Denies chest pain, dyspnea, orthopnea, hemoptysis, fever, n/v/d, edema, headache. Can not afford rx of lexapro. request generic. Still some anxiety and intermittent deprssion. no suicidal ideation.  Recently underwent body scan , w/ no evidence of pheochromcytoma/adrenal tumor. He is following w/ endocrinology for elevated catecholamines.   December 02, 2009--Presents an acute office visit. Wife is here for GI virus that she developed 1 week  ago w/ neg ER work up. He has started symptoms 4 days ago. Complains of weakness, no energy, nausea/diarrhea x4days . Last ov given zoloft , unable to take d/t weakness and confusion. He stopped and started back on diazepam.  Complains of 4 days ago w/ loose stools, upset stomach that lasted for 1 day. But now still weak and nausea. Has not used any meds. Is eating little w/ no vomitting. Denies chest pain, dyspnea, orthopnea, hemoptysis, fever, v/d, edema, headache,recent travel, bloody stools, urinary symptoms.      Medications Prior to Update: 1)  Meclizine Hcl 25 Mg Tabs (Meclizine Hcl) .Eric [name].. 1 Every 8 Hours As Needed For Dizziness 2)  Adult Aspirin Low Strength 81 Mg  Tbdp (Aspirin) .... Take 1 By Mouth Once Daily 3)  Plavix 75 Mg  Tabs (Clopidogrel Bisulfate) .... Take 1 By Mouth Once Daily 4)  Metoprolol Tartrate 50 Mg Tabs (Metoprolol Tartrate) .... 1.5 Tabs By Mouth Two Times A Day 5)  Lipitor 80 Mg  Tabs (Atorvastatin Calcium) .... Take 1 By Mouth Once Daily 6)  Zetia 10 Mg  Tabs (Ezetimibe) .... Take 1 By Mouth Once Daily 7)  Fish Oil 1000 Mg  Caps  (Omega-3 Fatty Acids) .... Take 1 Capsule By Mouth Two Times A Day 8)  Prevacid 15 Mg Cpdr (Lansoprazole) .... Take 1 Capsule By Mouth Once A Day 9)  Multivitamins   Tabs (Multiple Vitamin) .... Take 1 By Mouth Once Daily 10)  Protegra   Caps (Multiple Vitamins-Minerals) .... Take 1 By Mouth Once Daily 11)  Vitamin C 1000 Mg  Tabs (Ascorbic Acid) .... Take 1 By Mouth Two Times A Day 12)  Aldactazide 25-25 Mg Tabs (Spironolactone-Hctz) .Eric [name].. 1 By Mouth Two Times A Day 13)  Sertraline Hcl 100 Mg Tabs (Sertraline Hcl) .Eric [name].. 1 By Mouth Once Daily  Current Medications (verified): 1)  Meclizine Hcl 25 Mg Tabs (Meclizine Hcl) .Eric [name].. 1 Every 8 Hours As Needed For Dizziness 2)  Adult Aspirin Low Strength 81 Mg  Tbdp (Aspirin) .... Take 1 By Mouth Once Daily 3)  Plavix 75 Mg  Tabs (Clopidogrel Bisulfate) .... Take 1 By Mouth Once Daily 4)  Metoprolol Tartrate 50 Mg Tabs (Metoprolol Tartrate) .... 1.5 Tabs By Mouth Two Times A Day 5)  Lipitor 80 Mg  Tabs (Atorvastatin Calcium) .... Take 1 By Mouth Once Daily 6)  Zetia 10 Mg  Tabs (Ezetimibe) .... Take 1 By Mouth Once Daily  7)  Fish Oil 1000 Mg  Caps (Omega-3 Fatty Acids) .... Take 1 Capsule By Mouth Two Times A Day 8)  Prevacid 15 Mg Cpdr (Lansoprazole) .... Take 1 Capsule By Mouth Once A Day 9)  Multivitamins   Tabs (Multiple Vitamin) .... Take 1 By Mouth Once Daily 10)  Protegra   Caps (Multiple Vitamins-Minerals) .... Take 1 By Mouth Once Daily 11)  Vitamin C 1000 Mg  Tabs (Ascorbic Acid) .... Take 1 By Mouth Two Times A Day 12)  Aldactazide 25-25 Mg Tabs (Spironolactone-Hctz) .Eric [name].. 1 By Mouth Two Times A Day  Allergies (verified): 1)  Zoloft  Past History:  Past Medical History: Last updated: 08/23/2009 DIZZINESS (ICD-780.4) & HEARING LOSS (ICD-389.9) - see extensive eval by Rosine Beat 3/10 w/ Audiogram showing high freq loss that was similar to study in 7/09... Labs essent norm x increased catecholamines on 24H urine (?poss related to meds & off  Imipramine now)... MRI Brain w/ small vessel disease otherw neg... CT Abd neg- normal adrenals bilat... he was given a course of oral PRED- not much change in hearing & f/u w/ DrKraus due soon.  HYPERTENSION (ICD-401.9) - controllled on meds per [facility]= METOPROLOL 50mg - 1.5 tabsBid,  FELODIPINE 5mg /d (to be restarted),  ALDACTAZIDE 25-25 daily (added 11/2008).. ... prev mild LE edema resolved w/ the diuretic...  ~  2DEcho 7/09 by [facility] was normal...  CORONARY ARTERY DISEASE (ICD-414.00) - on ASA 81mg /d + Plavix 75mg /d... known CAD followed by [facility]= he had both diagonal branch & RCA stents placed in 2004, and f/u cath Jan10 showed patent stents...  ~  [facility]iew 11/09 by [facility] was negative...  ~  Event recorder as part of his dizziness work up was reported no signif arrhythmias.  CEREBROVASCULAR DISEASE (ICD-437.9) - + carotid disease followed by [facility] w/ CDopplers...  ~  CDopplers w/ 60-79% right carotid stenosis  PERIPHERAL VASCULAR DISEASE (ICD-443.9) - known mild RAS followed by ultrasounds at the [facility] office...  ~  Dopplers w/ 40% bilat RAS  ~neg venous dopplers for dvt 11/2008  HYPERLIPIDEMIA (ICD-272.4) - on LIPITOR 80mg /d, ZETIA 10mg /d, FISH OIL 1000mg Bid...  ~  FLP's followed by [facility] (we don't have recent numbers to review)...  IRRITABLE BOWEL SYNDROME (ICD-564.1) & COLONIC POLYPS (ICD-211.3) - followed for GI by DrStark & last colonoscopy was 2/07 showing mult 3-12mm polyps= tubular adenomas and f/u plasnned 32yrs.  NEPHROLITHIASIS (ICD-592.0) - he is followedfor Urology by DrGrapey and the pt tells me that he does his routine PSA screening as well- ?when this was done last?  FIBROMYALGIA (ICD-729.1) -  Anxiety --rx lexapro 10/10, changed to zoloft 100mg  once daily , as needed valium   Past Surgical History: Last updated: 08/23/2009 Elevated catecholamines in urine March  2010--  Epinephrine 864 (nl-<420) total catecholamines 911 (nl-<504). 24 hr urine norepinephrine elevated 175 (nl <80).  Sent for CT abd/pelvis neg for adrenal mass.  Imipramine stopped d/t potential for dizziness and catecholamine rise.  Referred to endocrinology, bone scan neg   Family History: Last updated: 05/19/2009 heart disease - 1 brother has defibrillator, father had CHF mother died of stroke lung cancer in 1 brother, sister has breast cancer  Social History: Last updated: 12/02/2009 Married former smoker.  quit in 1966 x45yrs, 1ppd. 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  quit in 1966 x19yrs, 1ppd. 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  also c/o weakness, no energy, nausea/diarrhea x4days - denies f/c/s. Is Patient Diabetic? No Comments Medications reviewed with patient Daytime contact number verified with patient. Boone Master CNA  December 02, 2009 2:54 PM    Physical Exam  Additional Exam:   GEN: A/Ox3; pleasant , NAD HEENT:  [place]/AT, , EACs-clear, TMs-wnl, NOSE-clear, THROAT-clear NECK:  Supple w/ fair ROM; no JVD; normal carotid impulses w/o bruits; no thyromegaly or nodules palpated; no lymphadenopathy. CHEST:  Clear to P & A; w/o, wheezes/ rales/ or rhonchi. HEART:  RRR, no m/r/g   no edema ABDOMEN:  Soft & nt; nml bowel sounds; no organomegaly or masses detected. EXT: Warm bil,  no calf tenderness,clubbing, pulses intact,  tr-1+ edema, neg homans sign, no redness.  Neuro: intact w/ no focal deficits noted.    Impression & Recommendations:  Problem # 1:  IRRITABLE BOWEL SYNDROME (ICD-564.1) w/ associated gastritis  REC : labs pending Advance diet as tolerated Increased Prevacid 15mg  two times a day for 5 days Avoid lactose/milk products for 5 days Gas X w/ meals I will call with labs.  Please contact office for sooner follow up if symptoms do not improve or worsen  follow up Dr. Kriste Basque in 1-2 months for fasting labs.  Orders: Est. Patient Level IV (29528)  Complete Medication List: 1)  Meclizine Hcl 25 Mg Tabs (Meclizine hcl) .Eric [name].. 1 every 8 hours as needed for dizziness 2)  Adult Aspirin Low Strength 81 Mg Tbdp (Aspirin) .... Take 1 by mouth once daily 3)  Plavix 75 Mg Tabs (Clopidogrel bisulfate) .... Take 1 by mouth once daily 4)  Metoprolol Tartrate 50 Mg Tabs (Metoprolol tartrate) .... 1.5 tabs by mouth two times a day 5)  Lipitor 80 Mg Tabs (Atorvastatin calcium) .... Take 1 by mouth once daily 6)  Zetia 10 Mg Tabs (Ezetimibe) .... Take 1 by mouth once daily 7)  Fish Oil 1000 Mg Caps (Omega-3 fatty acids) .... Take 1 capsule by mouth two times a day 8)  Prevacid 15 Mg Cpdr (Lansoprazole) .... Take 1 capsule by mouth once a day 9)  Multivitamins Tabs (Multiple vitamin) .... Take 1 by mouth once daily 10)  Protegra Caps (Multiple vitamins-minerals) .... Take 1 by mouth once daily 11)  Vitamin C 1000 Mg Tabs (Ascorbic acid) .... Take 1 by mouth two times a day 12)  Aldactazide 25-25 Mg Tabs (Spironolactone-hctz) .Eric [name].. 1 by mouth two times a day  Other Orders: TLB-BMP (Basic Metabolic Panel-BMET) (80048-METABOL) TLB-CBC Platelet - w/Differential (85025-CBCD) TLB-TSH (Thyroid Stimulating Hormone) (84443-TSH) TLB-Hepatic/Liver Function Pnl (80076-HEPATIC)  Patient Instructions: 1)  Advance diet as tolerated 2)  Increased Prevacid 15mg  two times a day for 5 days 3)  Avoid lactose/milk products for  5  days 4)  Gas X w/ meals 5)  I will call with labs.  6)  Please contact office for sooner follow up if symptoms do not improve or worsen  7)  follow up Dr. Kriste Basque in 1-2 months for fasting labs.

## 2010-11-03 NOTE — Letter (Signed)
Summary: [facility]  [facility]   Imported By: Sherian Rein 09/22/2010 14:08:28  _____________________________________________________________________  External Attachment:    Type:   Image     Comment:   External Document

## 2010-11-03 NOTE — Letter (Signed)
Summary: [facility]  [facility]   Imported By: Lester Aurora 10/22/2009 08:43:04  _____________________________________________________________________  External Attachment:    Type:   Image     Comment:   External Document

## 2010-11-03 NOTE — Progress Notes (Signed)
Summary: RESULTS  Phone Note Call from Patient   Caller: Patient Call For: PARRETT Summary of Call: PT CALLING TO GET LAB RESULTS Initial call taken by: Rickard Patience,  December 03, 2009 2:51 PM  Follow-up for Phone Call        see 12-03-09 append to 12-02-09 labs. Boone Master CNA  December 03, 2009 4:45 PM

## 2011-01-03 ENCOUNTER — Encounter: Payer: Self-pay | Admitting: Pulmonary Disease

## 2011-01-05 ENCOUNTER — Ambulatory Visit
Admission: RE | Admit: 2011-01-05 | Discharge: 2011-01-05 | Disposition: A | Discharge status: Home / Self Care | Payer: Medicare Other | Source: Ambulatory Visit | Attending: Cardiovascular Disease | Admitting: Cardiovascular Disease

## 2011-01-05 ENCOUNTER — Other Ambulatory Visit: Payer: Self-pay | Admitting: Cardiovascular Disease

## 2011-01-05 DIAGNOSIS — Z01811 Encounter for preprocedural respiratory examination: Secondary | ICD-10-CM

## 2011-01-11 ENCOUNTER — Ambulatory Visit (HOSPITAL_COMMUNITY)
Admission: RE | Admit: 2011-01-11 | Discharge: 2011-01-11 | Disposition: A | Discharge status: Home / Self Care | Payer: Medicare Other | Source: Ambulatory Visit | Attending: Cardiovascular Disease | Admitting: Cardiovascular Disease

## 2011-01-11 DIAGNOSIS — R079 Chest pain, unspecified: Secondary | ICD-10-CM | POA: Insufficient documentation

## 2011-01-11 DIAGNOSIS — I701 Atherosclerosis of renal artery: Secondary | ICD-10-CM | POA: Insufficient documentation

## 2011-01-11 DIAGNOSIS — Z9861 Coronary angioplasty status: Secondary | ICD-10-CM | POA: Insufficient documentation

## 2011-01-11 DIAGNOSIS — I150 Renovascular hypertension: Secondary | ICD-10-CM | POA: Insufficient documentation

## 2011-01-11 DIAGNOSIS — I2510 Atherosclerotic heart disease of native coronary artery without angina pectoris: Secondary | ICD-10-CM | POA: Insufficient documentation

## 2011-01-11 DIAGNOSIS — E785 Hyperlipidemia, unspecified: Secondary | ICD-10-CM | POA: Insufficient documentation

## 2011-01-16 LAB — COMPREHENSIVE METABOLIC PANEL
ALT: 27 U/L (ref 0–53)
AST: 23 U/L (ref 0–37)
Alkaline Phosphatase: 52 U/L (ref 39–117)
CO2: 25 mEq/L (ref 19–32)
Chloride: 106 mEq/L (ref 96–112)
GFR calc Af Amer: >60 mL/min (ref >60)
GFR calc non Af Amer: >60 mL/min (ref >60)
Sodium: 141 mEq/L (ref 135–145)
Total Bilirubin: 1.7 mg/dL — ABNORMAL HIGH (ref 0.3–1.2)

## 2011-01-16 LAB — CBC
HCT: 42.6 % (ref 39.0–52.0)
HCT: 43.1 % (ref 39.0–52.0)
Hemoglobin: 14.3 g/dL (ref 13.0–17.0)
MCHC: 33.5 g/dL (ref 30.0–36.0)
MCHC: 33.6 g/dL (ref 30.0–36.0)
MCHC: 33.7 g/dL (ref 30.0–36.0)
MCV: 92.9 fL (ref 78.0–100.0)
MCV: 92.9 fL (ref 78.0–100.0)
MCV: 93.5 fL (ref 78.0–100.0)
Platelets: 279 10*3/uL (ref 150–400)
Platelets: 282 10*3/uL (ref 150–400)
Platelets: 307 10*3/uL (ref 150–400)
RBC: 4.51 MIL/uL (ref 4.22–5.81)
RBC: 4.55 MIL/uL (ref 4.22–5.81)
RDW: 12.6 % (ref 11.5–15.5)
RDW: 12.8 % (ref 11.5–15.5)
WBC: 11.2 10*3/uL — ABNORMAL HIGH (ref 4.0–10.5)
WBC: 9.1 10*3/uL (ref 4.0–10.5)
WBC: 9.6 10*3/uL (ref 4.0–10.5)

## 2011-01-16 LAB — BASIC METABOLIC PANEL
BUN: 12 mg/dL (ref 6–23)
BUN: 12 mg/dL (ref 6–23)
BUN: 7 mg/dL (ref 6–23)
CO2: 27 mEq/L (ref 19–32)
CO2: 28 mEq/L (ref 19–32)
CO2: 28 mEq/L (ref 19–32)
CO2: 30 mEq/L (ref 19–32)
Calcium: 9.1 mg/dL (ref 8.4–10.5)
Calcium: 9.1 mg/dL (ref 8.4–10.5)
Chloride: 103 mEq/L (ref 96–112)
Chloride: 104 mEq/L (ref 96–112)
Chloride: 106 mEq/L (ref 96–112)
Creatinine, Ser: 0.7 mg/dL (ref 0.4–1.5)
Creatinine, Ser: 1.02 mg/dL (ref 0.4–1.5)
GFR calc Af Amer: >60 mL/min (ref >60)
GFR calc non Af Amer: >60 mL/min (ref >60)
GFR calc non Af Amer: >60 mL/min (ref >60)
Glucose, Bld: 115 mg/dL — ABNORMAL HIGH (ref 70–99)
Glucose, Bld: 142 mg/dL — ABNORMAL HIGH (ref 70–99)
Potassium: 3.8 mEq/L (ref 3.5–5.1)
Sodium: 141 mEq/L (ref 135–145)

## 2011-01-16 LAB — POCT CARDIAC MARKERS
CKMB, poc: 1.2 ng/mL (ref 1.0–8.0)
Myoglobin, poc: 57.1 ng/mL (ref 12–200)
Troponin i, poc: <0.05 ng/mL (ref 0.00–0.09)
Troponin i, poc: <0.05 ng/mL (ref 0.00–0.09)

## 2011-01-16 LAB — DIFFERENTIAL
Basophils Absolute: 0.1 10*3/uL (ref 0.0–0.1)
Basophils Relative: 1 % (ref 0–1)
Eosinophils Relative: 0 % (ref 0–5)
Lymphs Abs: 1.5 10*3/uL (ref 0.7–4.0)
Monocytes Relative: 4 % (ref 3–12)
Neutro Abs: 9.2 10*3/uL — ABNORMAL HIGH (ref 1.7–7.7)

## 2011-01-16 LAB — CARDIAC PANEL(CRET KIN+CKTOT+MB+TROPI)
Relative Index: INVALID (ref 0.0–2.5)
Relative Index: INVALID (ref 0.0–2.5)
Relative Index: INVALID (ref 0.0–2.5)
Total CK: 54 U/L (ref 7–232)
Troponin I: <0.01 ng/mL (ref 0.00–0.06)

## 2011-01-16 LAB — PROTIME-INR
INR: 1 (ref 0.00–1.49)
Prothrombin Time: 14.2 seconds (ref 11.6–15.2)

## 2011-01-16 LAB — HEPATIC FUNCTION PANEL
ALT: 26 U/L (ref 0–53)
Albumin: 3.4 g/dL — ABNORMAL LOW (ref 3.5–5.2)
Alkaline Phosphatase: 50 U/L (ref 39–117)
Total Bilirubin: 1.9 mg/dL — ABNORMAL HIGH (ref 0.3–1.2)

## 2011-01-16 LAB — POCT I-STAT, CHEM 8: TCO2: 24 mmol/L (ref 0–100)

## 2011-01-16 LAB — LIPID PANEL
Cholesterol: 148 mg/dL (ref 0–200)
HDL: 38 mg/dL — ABNORMAL LOW (ref >39)
Total CHOL/HDL Ratio: 3.9 RATIO

## 2011-01-16 LAB — URINALYSIS, ROUTINE W REFLEX MICROSCOPIC
Bilirubin Urine: NEGATIVE
Glucose, UA: NEGATIVE mg/dL
Hgb urine dipstick: NEGATIVE
Urobilinogen, UA: 1 mg/dL (ref 0.0–1.0)
pH: 7.5 (ref 5.0–8.0)

## 2011-01-16 LAB — TSH: TSH: 2.208 u[IU]/mL (ref 0.350–4.500)

## 2011-01-25 NOTE — Cardiovascular Report (Signed)
NAMEHASANI, DIEMER                ACCOUNT NO.:  [id number]  MEDICAL RECORD NO.:  [id number]           PATIENT TYPE:  O  LOCATION:  [facility]                         FACILITY:  [facility]  PHYSICIAN:  Nanetta Batty, M.D.   DATE OF BIRTH:  1936/08/22  DATE OF PROCEDURE: DATE OF DISCHARGE:                           CARDIAC CATHETERIZATION   HISTORY OF PRESENT ILLNESS:  Mr. Shafer is a 75 year old mildly overweight married Caucasian male father of 4, grandfather of 8 grandchildren who I saw in the office on December 19, 2010.  He has a history of CAD and PVOD.  I stented his LAD and diagonal branch July 14, 2003, and subsequent RCA stenting by Dr. Elsie Lincoln on July 31, 2003. He apparently has bilateral renal artery stenosis and mild to moderate bilateral carotid disease.  He was recathed by Dr. Clarene Duke October 21, 2008, revealing patent stents with normal LV function.  He is complaining of reproducible exertional chest tightness similar to his prior angina.  A Myoview stress test was nonischemic but given his ongoing symptoms he presents now for outpatient diagnostic coronary arteriography to define his anatomy and rule out ischemic etiology.  DESCRIPTION OF PROCEDURE:  The patient was brought to the Second Floor [facility] Cardiac Cath Lab in a postabsorptive state.  He was premedicated with p.o. Valium.  His right groin was prepped and shaved in the usual sterile fashion.  Xylocaine 1% was used for local anesthesia.  A 5-French sheath was inserted into the right femoral artery using standard Seldinger technique.  A 5-French right and left Judkins diagnostic catheter as well as 5-French pigtail catheter were used for selective coronary angiography, left ventriculography and distal abdominal aortography.  Visipaque dye was used for the entirety of the case.  Retrograde aortic, left ventricular and pullback pressures were recorded.  HEMODYNAMIC DATA: 1. Aortic systolic pressure 151,  diastolic pressure 73. 2. Left ventricular systolic pressure 162, end-diastolic pressure 21.  SELECTIVE CORONARY ANGIOGRAPHY: 1. Left main normal. 2. LAD; the LAD diagonal bifurcation stents were widely patent. 3. Left circumflex widely patent. 4. Ramus branch moderate in size and widely patent. 5. Right coronary artery dominant with a patent stent in midportion. 6. Left ventriculography; RAO left ventriculogram was performed using     25 mL of Visipaque dye at 12 mL per second.  The overall LVEF was     estimated greater than 60% without focal wall motion abnormalities. 7. Distal abdominal aortography:  distal abdominal aortogram was     performed using 20 mL of Visipaque dye at 20 mL per second.  The     renal arteries appeared widely patent as did the iliac bifurcation.  Angiogram of the right common femoral artery revealed puncture well above the bifurcation and suitable for closure device placement.  IMPRESSION:  Mr. Suen has widely patent stents and normal coronary arteries.  I do not think his chest pain is cardiac.  His right groin was sealed with "xtra seal" and hemostasis was obtained.  The patient left the lab in stable condition.  He will be gently hydrated, may recumbent for 2 hours and will be discharged  home after that.  I will see him back in the office in 1-2 weeks for followup.     Nanetta Batty, M.D.     JB/MEDQ  D:  01/11/2011  T:  01/12/2011  Job:  [id number]  cc:   [facility] Cardiac [facility] Lab [facility]. Kriste Basque, MD  Electronically Signed by Nanetta Batty M.D. on 01/25/2011 04:12:40 PM

## 2011-02-14 NOTE — Discharge Summary (Signed)
NAMETOU, HAYNER                ACCOUNT NO.:  [id number]   MEDICAL RECORD NO.:  [id number]          PATIENT TYPE:  INP   LOCATION:  3707                         FACILITY:  [facility]   PHYSICIAN:  Nanetta Batty, M.D.   DATE OF BIRTH:  [birth year]   DATE OF ADMISSION:  08/09/2008  DATE OF DISCHARGE:  08/10/2008                               DISCHARGE SUMMARY   DISCHARGE DIAGNOSES:  1. Presyncope.  2. Gastrointestinal disturbance with hot flash with diaphoresis,      nausea, vomiting and large bowel movement.  3. Known coronary artery disease status post history of an inferior      myocardial infarction in 2001 treated with percutaneous      transluminal coronary angioplasty of his right coronary artery by      Dr. Julieanne Manson on July 14, 2003.  He underwent      catheterization and intervention by Dr. Nanetta Batty with      diagonal branch kissing balloon stenting technique with drug-      eluting stents.  4. Hyperlipidemia.  5. Hypertension.  6. History of atherosclerotic cardiovascular disease with bilateral      renal artery stenosis stable and history of right internal carotid      artery stenosis with recent Dopplers done in the office that showed      a mild increase in his right internal carotid artery stenosis.  7. Normal ejection fraction.   LABORATORY DATA:  Helicobacter pylori antibodies 1.3, sodium 138,  potassium 4.0, chloride 101, CO2 30, glucose 102, BUN 10, creatinine  0.81.  CK-MB and troponin negative x3.  Total cholesterol 146,  triglycerides 95, HDL 41, LDL 86, hemoglobin 14.0, hematocrit 41.3, WBCs  12.3 and platelets 462.  TSH 0.585.  Magnesium 2.1.  Lipase 25, amylase  77.  X-ray showed atelectasis, otherwise negative.   DISCHARGE MEDICATIONS:  1. Aspirin 81 mg a day.  2. Vitamin C one everyday.  3. Fish oil 1 tablet everyday.  4. Multivitamin everyday.  5. Lipitor 80 mg everyday.  6. Metoprolol 25 mg twice per day.  7. Imipramine 50 mg daily.  8. Zetia 10 mg a day.  9. Clorazepate 7.5 mg two times a day.  10.Felodipine ER 5 mg daily.  11.Plavix 75 mg everyday.   He is scheduled for an outpatient stress Myoview at 9:45 on August 12, 2008.  He will have a followup visit with Dr. Allyson Sabal on August 26, 2008, at 2:30.   HOSPITAL COURSE:  Mr. Trier is a 75 year old white married male patient  of Dr. Nanetta Batty with known coronary artery disease.  He came into  the emergency room secondary to a presyncopal episode.  Apparently, he  had a normal morning.  He went to church and actually sang.  He then  went to [facility] was standing in line, felt very hot, got his food, sat down  and he suddenly needed to use the restroom.  He  apparently had a large  BM and had an episode of diaphoresis and came out of the bathroom and  his wife states he  was white as a sheet.  Thus, he was brought to the  emergency room.  In the emergency room, he apparently got nauseated and  vomited x1 and then felt okay.  He was seen by Dr. Kem Boroughs.  She  decided to admit him just for observation overnight.  His labs were all  basically negative.  His H. pylori was pending at the time of his  discharge, it is slightly elevated.  We will need to discuss this with  him in the office.  He was set up as an outpatient for a Myoview, and he  will follow with Dr. Allyson Sabal.      Lezlie Octave, N.P.      Nanetta Batty, M.D.  Electronically Signed    BB/MEDQ  D:  08/10/2008  T:  08/11/2008  Job:  [id number]

## 2011-02-14 NOTE — Cardiovascular Report (Signed)
NAMESHEFFIELD, HAWKER                ACCOUNT NO.:  [id number]   MEDICAL RECORD NO.:  [id number]          PATIENT TYPE:  INP   LOCATION:  2038                         FACILITY:  [facility]   PHYSICIAN:  Thereasa Solo. Little, M.D. DATE OF BIRTH:  10-Feb-1936   DATE OF PROCEDURE:  10/21/2008  DATE OF DISCHARGE:                            CARDIAC CATHETERIZATION   INDICATIONS FOR TEST:  This 75 year old male has known coronary artery  disease having had an inferior infarct in 2001 and angioplasty only to  his distal RCA.  Subsequently, he has had stent placed in the double  barrel type fashion to his diagonal and LAD and a stent in the proximal  midportion of his RCA.   He is a Naval architect.  He has had some chest discomfort intermittently,  one episode lasted up to 7 hours.  In addition to this, he has episodes  where he felt slightly lightheaded and has had some awareness of his  heart beating fast.  His cardiac markers were negative.  His EKG shows  an old inferior MI, sinus tachycardia with occasional PAVs.  He was  brought to the cath lab for definitive evaluation of his coronary  anatomy.   After obtaining informed consent, the patient was prepped and draped in  the usual sterile fashion exposing the right groin.  Following local  anesthetic with 1% Xylocaine, the Seldinger technique was employed and a  5-French introducer sheath was placed in the right femoral artery.  Left  and right coronary arteriography and ventriculography was performed.   A distal aortogram was performed because his pressure was in the 171/80  range during the procedure.   TOTAL CONTRAST USED:  100 mL.   EQUIPMENT:  5-French Judkins configuration catheters.   COMPLICATIONS:  None.   RESULTS:  1. Hemodynamic monitoring.  His central aortic pressure is 175/77.      His left ventricular pressure is 160/6.  2. Ventriculography.  Ventriculography in the RAO projection using 25      mL at 12 mL per second was  performed at the end of the procedure.      He has normal LV systolic function with no wall motion      abnormalities.  Ejection fraction is in excess of 60%.  End-      diastolic pressure is 13.  3. Distal aortogram.  The distal aortogram done at the level of renal      artery showed no renal artery stenosis and no infrarenal disease.  4. Coronary arteriography.      a.     Left main normal.  It trifurcated.      b.     Circumflex.  The circumflex gave rise to only one OM vessel.       This system was free of disease.      c.     Optional diagonal medium-sized vessel free of disease.      d.     LAD.  On fluoroscopy to see stents in the distribution of       the LAD diagonal as though they  were catching.  The stent in the       LAD has a mild step-up in the proximal portion.  The body of the       stent is widely patent and the mid and distal LAD is free of       disease.  The diagonal stent is also widely patent.  There is also       a step-up on the proximal edge of this.  There is brisk TIMI III       flow in both the LAD and the diagonal.  There is no hangup of       contrast and there are no obvious areas of stenosis despite the       double barrel appearance of the stent.      e.     Right coronary artery.  There is a stent in the midportion       of the right coronary artery and it is widely patent.  The distal       RCA particularly the prior angioplasty site in 2001 is widely       patent.  PDA is free of disease.   CONCLUSION:  1. Widely patent stents in the RCA and LAD diagonal system with only      mild proximal step-up of the diagonal and LAD stents.  2. Normal LV function.  3. No evidence of renal artery stenosis or aneurysm.   I cannot explain his discomfort based on his cath anatomy.  Even after  the cath, he was still having some discomfort in his chest.  I  empirically started him on PPIs.  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  We will monitor him overnight and  ambulate him tomorrow.  If we do not find any definitive arrhythmia, he  will probably need an event monitor if this is unremarkable, a  implantable loop recorder.  I will also check a D-dimer, although his  symptoms do not sound like pulmonary emboli.           ______________________________  Thereasa Solo. Little, M.D.     ABL/MEDQ  D:  10/21/2008  T:  10/21/2008  Job:  [id number]   cc:   Nanetta Batty, M.D.  Lonzo Cloud. Kriste Basque, MD  Cath Lab

## 2011-02-14 NOTE — Discharge Summary (Signed)
NAMEJABE, Eric [name]                ACCOUNT NO.:  [id number]   MEDICAL RECORD NO.:  [id number]          PATIENT TYPE:  INP   LOCATION:  3704                         FACILITY:  [facility]   PHYSICIAN:  Nanetta Batty, M.D.   DATE OF BIRTH:  03-03-1936   DATE OF ADMISSION:  10/29/2008  DATE OF DISCHARGE:  10/30/2008                               DISCHARGE SUMMARY   DISCHARGE DIAGNOSES:  1. Dizziness of undetermined etiology.  2. Treated hypertension.  3. Intermittent sinus tachycardia.  4. Coronary artery disease with previous percutaneous coronary      intervention and stenting in 2004 with recent catheterization      showing patent stent sites and a normal left ventricular function.  5. Moderate vascular disease including carotid and renal artery      disease.  6. Dyslipidemia.  7. History of arteriovenous malformation.   HOSPITAL COURSE:  The patient is a 75 year old male followed by Dr.  Allyson Sabal with coronary artery disease.  He had previous intervention in  2004.  Recently, has been admitted for evaluation of intermittent  dizziness.  He was just discharged on October 22, 2008, after a syncopal  spell associated with sinus tachycardia.  His symptoms seemed to improve  with meclizine as an outpatient.  He had another episode at home on  October 29, 2008.  He does have a monitor on, but unfortunately, it is a  self-activated monitor and he did not activated.  He was admitted to  telemetry for further evaluation.  His enzymes are negative.  His  monitor shows sinus tachycardia but no arrhythmias.  He did have a D-  dimer during his last admission that was normal at 0.27.  He is not  short of breath.  Dr. Jacinto Halim feels he can be discharged later on October 30, 2008.  He will be ambulated and just make sure he is okay on his  feet.  He will continue with his monitor and meclizine p.r.n. and see [place]  back as scheduled on November 12, 2008.   LABORATORY DATA:  White count 9.1, hemoglobin  14.1, hematocrit 41.9.  Sodium 141, potassium 3.8, BUN 10, creatinine 1.04.  Troponins were  negative x2.  TSH is 2.2.  Magnesium is 2.2.  D-dimer was negative last  admission.   DISPOSITION:  The patient is discharged in stable condition.  He will  follow up with Dr. Allyson Sabal as noted.   He will continue with his current medications including:  1. Metoprolol 75 mg b.i.d.  2. Aspirin 81 mg a day.  3. Lipitor 80 mg a day.  4. Plavix 75 mg a day.  5. Plendil 5 mg a day.  6. Clorazepate 7.5 mg b.i.d.  7. Imipramine 50 mg a day.  8. Nexium 40 mg a day.  9. Meclizine 25 mg q.8 p.r.n.      Abelino Derrick, P.A.      Nanetta Batty, M.D.  Electronically Signed    LKK/MEDQ  D:  10/30/2008  T:  10/31/2008  Job:  [id number]   cc:   Lonzo Cloud. Kriste Basque, MD

## 2011-02-14 NOTE — Op Note (Signed)
NAMEHENDRICKS, SCHWANDT                ACCOUNT NO.:  [id number]   MEDICAL RECORD NO.:  [id number]          PATIENT TYPE:  AMB   LOCATION:  SDS                          FACILITY:  [facility]   PHYSICIAN:  Jefry H. Pollyann Kennedy, MD     DATE OF BIRTH:  July 29, 1936   DATE OF PROCEDURE:  07/12/2007  DATE OF DISCHARGE:                               OPERATIVE REPORT   PREOPERATIVE DIAGNOSIS:  Chronic sinusitis, including chronic ethmoid,  chronic maxillary and chronic frontal sinusitis.   POSTOPERATIVE DIAGNOSIS:  Chronic sinusitis, including chronic ethmoid,  chronic maxillary and chronic frontal sinusitis.   PROCEDURES:  1. Bilateral endoscopic total ethmoidectomy.  2. Bilateral endoscopic maxillary antrostomy.  3. Bilateral endoscopic frontal sinusotomy.   SURGEON:  Jefry H. Pollyann Kennedy, MD.   General endotracheal anesthesia was used.  No complications.  Blood loss  minimal.   FINDINGS:  Diffuse polypoid changes of the mucosa within the ethmoid  complex bilaterally, including some organized polyps filling posterior  ethmoid cells on the right side.  The frontal recesses were obstructed  bilaterally with polypoid mucosa as well.  No complications.   HISTORY:  A 75 year old with a history of chronic sinusitis, who has  been treated over the past several years medically and continues to have  chronic symptoms and chronic findings on CT scan.  Risks, benefits,  alternatives, complications of the procedure were explained to the  patient, who seemed to understand and agreed to surgery.   PROCEDURE:  The patient was taken to the operating room, placed on the  operating table in supine position.  Following the induction of general  endotracheal anesthesia, the patient was prepped and draped in a  standard fashion.  Afrin spray was used preoperatively.  Xylocaine 1%  with epinephrine was infiltrated into the superior and posterior  attachments of the middle turbinates and the lateral nasal wall   bilaterally.   1. Bilateral endoscopic total ethmoidectomy.  Uncinate processes were      taken down using a sickle knife bilaterally.  The bulla was exposed      bilaterally and was taken down using the microdebrider.  A complete      ethmoid dissection was performed bilaterally.  The lateral limit of      dissection was the lamina papyracea, which was completely intact on      the left side and on the right side had some dehiscences that      caused a floating of the lamina, but there was no defect noted.      The superior limit of dissection was the fovea ethmoidalis.  This      was kept intact.  All mucosal disease was removed using the suction      debrider.  The ground lamella was taken down to expose the      posterior cells and on the right side there was extensive polypoid      disease present.  The dissection stopped at the face of the      sphenoid.  Ethmoid sinuses were packed with Kyung Rudd packs at the  termination of the procedure, which was then inflated using a local      anesthetic solution.   1. Bilateral maxillary antrostomy.  After the bulla and the anterior      ethmoid dissection was completed, the maxillary antrostomies were      performed bilaterally using a 30-degree endoscope and a curved      suction to enter through the fontanelle and back-biters to enlarge      the antrostomy anteriorly and through-cut forceps to enlarge      posteriorly and inferiorly.  Large antrostomies were created.      There was minimal polypoid tissue removed from the superior aspect      of the antrum on the right side.   1. Bilateral endoscopic frontal sinusotomy.  After the ethmoid      dissection was completed, the frontal recess was inspected      bilaterally.  Polypoid changes and hyperplastic mucosa were cleaned      out using the microdebrider and giraffe angled forceps.  The 70-      degree scope was used for this part of the dissection.  At      termination of the  procedure, the frontal sinuses were widely open      and easily examined.  The nasal cavity and sinus cavities were      suctioned of blood and secretions, as was the pharynx.  The packing      was placed as described above.  The patient was then awakened,      extubated, and transferred to recovery in stable condition.      Jefry H. Pollyann Kennedy, MD  Electronically Signed     JHR/MEDQ  D:  07/12/2007  T:  07/13/2007  Job:  [id number]

## 2011-02-14 NOTE — Discharge Summary (Signed)
Eric [name], Eric [name]                ACCOUNT NO.:  [id number]   MEDICAL RECORD NO.:  [id number]          PATIENT TYPE:  INP   LOCATION:  2038                         FACILITY:  [facility]   PHYSICIAN:  Nanetta Batty, M.D.   DATE OF BIRTH:  [birth year]   DATE OF ADMISSION:  10/20/2008  DATE OF DISCHARGE:  10/22/2008                               DISCHARGE SUMMARY   DISCHARGE DIAGNOSES:  1. Dizziness, presyncope possibly secondary to sinus tachycardia per      monitoring.  2. Chest pain, negative myocardial infarction.  3. Coronary disease with history of previous stent.      a.     Cardiac catheterization revealed stable patent stents,       noncardiac chest pain, EF 60%.  4. Nausea, vomiting associated with dizziness, resolved.  5. Carotid disease.  6. Renal artery stenosis.  7. Hypertension.  8. Elevated cholesterol.  9. History of murmur.   DISCHARGE CONDITION:  Improved.   PROCEDURE:  October 21, 2008, combined left heart cath by Dr. Julieanne Manson.   DISCHARGE MEDICATIONS:  1. Aspirin 81 mg daily.  2. Lipitor 80 mg daily.  3. Plavix 75 mg daily.  4. Plendil 5 mg daily.  5. Toprol-XL, we increased to 75 mg daily.  6. Zetia 10 mg daily.  7. Clorazepate dipotassium 7.5 mg twice a day.  8. Imipramine HCl 50 mg daily.  9. We added Nexium 40 mg twice a day.  10.We added meclizine 25 mg one every 8 hours as needed for dizziness.   DISCHARGE INSTRUCTIONS:  1. No work until he sees Dr. Allyson Sabal.  No driving until he sees Dr.      Allyson Sabal.  2. Low-sodium, heart-healthy diet.  3. Wash cath site with soap and water.  Call if any bleeding,      swelling, or drainage.  4. Increase activity slowly, may shower and no lifting for 2 days.  5. Follow up with Dr. Allyson Sabal, November 12, 2008 at 8:30 a.m.  6. ER scheduled for auto event monitor at Dr. Hazle Coca office, October 23, 2008 at 10 a.m.   HISTORY OF PRESENT ILLNESS:  A 75 year old white married male with  history of coronary disease  with a stent to the diagonal branch in  October 2004, stent to the RCA in October 2004, history of mild  bilateral renal artery stenosis with 40% mild-to-moderate carotid  disease, history of hypertension, hypercholesteremia, normal EF,  diastolic dysfunction.  He was admitted October 19, 2008 secondary to  beginning at 3 p.m. that day sudden onset of dizziness followed by  nausea, vomiting.  He laid down, symptoms improved, but when he awoke he  had chest pain radiating across the anterior chest.  Blood pressure was  elevated.  Chest pain eased, dizziness, nausea, vomiting recurred when  standing for chest x-ray in the emergency room.  Initially admitted, he  felt weak but stable.  He did have an ear infection 1 month prior to  hospitalization with congestion since that time.  No spinning sensation.  It was a lightheaded feeling, not  room spinning.  Has chronic  palpitations and described as skips, but no recent change.  The patient  was admitted for evaluation.  Cardiac enzymes were negative.  He did  undergo cardiac catheterization revealing patent stent, additionally, he  developed sinus tach on the 20th, heart rate up to 150.  It was regular  and not felt to be AFib at that time, they looked certainly be flutter,  only one episode.  His beta-blocker was increased secondary as well to  hypertension.  He did well after his cardiac cath and no further  dizziness with ambulation.  Heart rate did climb up to 129 with one  walking episode.  His Lopressor in the hospital was increased to 50  twice a day which improved the heart rate.  I will discharge him on  Toprol-XL at home.  We will discharge him on 75 mg of Toprol-XL daily.  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 Allyson Sabal in 2-3 weeks.  We will arrange for [facility].  Discussed with Dr. Clarene Duke prior to discharge and felt he was stable to  be discharged home.   LABORATORY DATA:  Hemoglobin  at discharge 14.4, hematocrit 43, WBC 9.3,  platelets 279, MCV 92, at one point the WBC was 11.2 but was stable at  discharge.  Chemistry; sodium 140, potassium 3.8, chloride 103, CO2 of  28, BUN 12, creatinine 0.86, glucose 115 and stable.  Coags, protime  14.2, INR of 1.1, AST 28, ALT 26, alkaline phos 50, total bili 0.9,  albumin 3.4, calcium 9.3, magnesium 1.9.  Cardiac enzymes were negative.  CK 90, 81, and 71; MB is 1.9, 1.8, 1.4.   Troponin I less than 0.01-0.04.   Total cholesterol 148, LDL 84, HDL 38, triglycerides 130.  UA was clear.  TSH was 0.877 and D-dimer was 0.27.   Chest x-ray on admission PA and lateral, no evidence of acute  cardiopulmonary disease, stable interstitial prominence.  CT of the  head, no evidence of acute intracranial abnormality.   HOSPITAL COURSE:  As described.  The patient will follow with Dr. Allyson Sabal.      Darcella Gasman. Annie Paras, N.P.      Nanetta Batty, M.D.  Electronically Signed    LRI/MEDQ  D:  10/22/2008  T:  10/23/2008  Job:  [id number]   cc:   Nanetta Batty, M.D.  Lonzo Cloud. Kriste Basque, MD

## 2011-02-14 NOTE — Discharge Summary (Signed)
[name], Eric                ACCOUNT NO.:  [id number]   MEDICAL RECORD NO.:  [id number]          PATIENT TYPE:  INP   LOCATION:  2038                         FACILITY:  [facility]   PHYSICIAN:  Nanetta Batty, M.D.   DATE OF BIRTH:  1936-03-27   DATE OF ADMISSION:  10/20/2008  DATE OF DISCHARGE:  10/22/2008                               DISCHARGE SUMMARY   ADDENDUM:  Please note on the discharge medication list incorrectly was  listed Toprol when discussing with the patient and then the pharmacy,  the patient gets his medicine from.  He is actually on the metoprolol  titrate i.e., Lopressor on 25 mg twice a day at home.  We have increased  this to 75 mg twice a day, 15 mg tablet one-half tab twice a day.      Darcella Gasman. Annie Paras, N.P.      Nanetta Batty, M.D.  Electronically Signed    LRI/MEDQ  D:  10/22/2008  T:  10/23/2008  Job:  [id number]

## 2011-02-17 NOTE — Discharge Summary (Signed)
Eric [name]. [facility]  Patient:    Eric [name], Eric [name]                       MRN: [id number] Adm. Date:  [id number] Disc. Date: [id number] Attending:  Berry, Jonathan [name] Dictator:   Eric [name]. Eric [name], F.N.P.C. CC:         Eric [name], M.D.             Eric [name]. Eric [name], M.D. [facility]             Eric [name]., M.D. [facility]                           Discharge Summary  DISCHARGE DIAGNOSES: 1. Acute inferior myocardial infarction. 2. Coronary artery disease with rescue percutaneous transluminal coronary    angioplasty of the right coronary artery. 3. Hypertension. 4. Tachycardia.  DISCHARGE CONDITION:  Improved.  PROCEDURES:  On January 13, 2000, emergency cardiac catheterization with rescue PTCA of the right coronary artery by Dr. Julieanne [name].  DISCHARGE MEDICATIONS: 1. Altace 2.5 mg daily. 2. Lopressor 50 mg every 12 hours. 3. Enteric-coated aspirin 325 mg daily. 4. Tofranil 50 mg at bedtime as before. 5. Zocor 20 mg at bedtime. 6. Do not take atenolol. 7. Nitroglycerin 150 sublingual p.r.n. chest pain.  DISCHARGE INSTRUCTIONS: 1. No driving. 2. No sexual activity for one week. 3. No work for six weeks. 4. Low-fat, low-salt diet. 5. Wash catheterization site with soap and water to cause any bleeding,    swelling, or drainage. 6. Follow up with Eric [name] on Feb 01, 2000, at 11:30 a.m.  HISTORY OF PRESENT ILLNESS:  A 75 year old white married male patient of Eric [name] with prior history of normal Cardiolite in 1993 by Eric [name] in the emergency room January 13, 2000, with complaints of chest pain.  EKG revealed an acute inferior MI, started having pain at 3:00 p.m. while he was out in his yard digging.  He was diaphoretic but no nausea and vomiting and no presyncope.  He did have radiation down his left arm.  Wife came home and found him, called 911.  Patient denies any recent chest pain prior to today or prior to admission.  No shortness of  breath, or presyncope, or indigestion.  PAST MEDICAL HISTORY:  Hypertension, colon polypectomy, hyperlipidemia, nephrolithiasis.  No history of CVA, TIA, or clots.  No history of GI bleed.  OUTPATIENT MEDICATIONS: 1. Aspirin one daily. 2. Metoprolol 50 daily. 3. Zocor 40 at h.s. 4. Multivitamin, vitamin C, and garlic. 5. Imipramine 25 mg two at bedtime for sleep. 6. Tranxene three times a day.  ALLERGIES:  Tetanus.  FAMILY HISTORY, SOCIAL HISTORY, REVIEW OF SYSTEMS:  See history and physical.  PHYSICAL EXAMINATION AT DISCHARGE:  VITAL SIGNS:  Blood pressure 132/70, pulse 84, respirations 18, temperature 98.1.  Room air oxygen saturation 98%.  GENERAL:  Alert and oriented white male in no acute distress.  HEART:  S1, S2.  Regular rate and rhythm.  LUNGS:  Clear without rales, rhonchi, or wheezes.  EXTREMITIES:  2+ right pedal, no hematoma.  LABORATORY DATA:  An admission WBC count 12.1, hemoglobin 14.3, hematocrit 38, MCV 82, platelets 344, neutrophils 67, lymphs 25, monos 4, eos 2, basos 1, leukocytes 1.  Prior to discharge, hemoglobin 11.6, hematocrit 33, WBC 10.9. Pro time 13.2 on admission, INR of 1, PTT 20.  Sodium 138, potassium 3.5, chloride  105, CO2 27, glucose 127, BUN 27, creatinine 0.9, calcium 9.2, total protein 7.4, albumin 4.1, AST 33, ALT 39, ALP 61, total bilirubin 1, magnesium 2.  Potassium did drop to a low of 3.4, was given supplement and came back to normal.  Cardiac enzymes:  First CK was 119, MB 1.5.  Troponin I was less than 0.03. Second CK was 929 with an MB of 202.  Third CK was 937 with an MB of 188.  Cholesterol 153, triglycerides 208, HDL 29, and LDL 82.  On radiology, chest x-ray - poor inspiration, no active disease.  EKG, January 12, 1630, acute inferior MI with ST elevations in II, III, and aVF with reciprocal changes in V1, 2, and 3, and lead I and aVL.  Follow-up later in the day on April 13 inferolateral ST elevation resolved and, on  April 14, no significant change, inferior infarction, age undetermined and later, on April 14, again the same.  January 13, 2000, left heart catheterization:  Left man was normal, LAD minimal irregularities in the proximal and mid segment.  First diagonal had a proximal 50% area of narrowing just distal to the ostium, ______ diagonal ostial 40 to 50% narrowing.  Circumflex free of disease and RCA very tortuous with proximal mild irregularities.  Distal RCA just proximal to the PDA 95% area of narrowing.  HOSPITAL COURSE:  Eric [name] was admitted by Dr. Clarene [name] on call for Eric [name] on January 13, 2000, with an acute inferior MI.  He was taken emergently to the catheterization lab and underwent catheterization and rescue PTCA of the right coronary artery, tolerated the procedure well, was given IV Integrilin as well as a nitroglycerin, and transferred to the coronary care unit.  He continued to do well.  Pacemaker was placed during the cardiac catheterization secondary to speeding and slowing of heart rate acutely.  It was discontinued on April 14 after no further arrhythmias.  IV nitroglycerin was weaned and discontinued by April 14.  Lopressor was started.  Patient continued to progress and was transferred to the 2000 unit.  By January 17, 1999, he was felt ready for discharge but had an episode of sinus tachycardia 130 to 150, beta blocker was increased and patient was kept another 24 hours.  By January 18, 2000, he was stable and ready for discharge home and was discharged by Dr. Elsie [name]. DD:  02/10/00 TD:  02/13/00 Job: [id number]

## 2011-02-17 NOTE — Cardiovascular Report (Signed)
NAMEETHELBERT, THAIN                          ACCOUNT NO.:  [id number]   MEDICAL RECORD NO.:  [id number]                   PATIENT TYPE:  INP   LOCATION:  3733                                 FACILITY:  [facility]   PHYSICIAN:  Nanetta Batty, M.D.                DATE OF BIRTH:  April 20, 1936   DATE OF PROCEDURE:  07/14/2003  DATE OF DISCHARGE:                              CARDIAC CATHETERIZATION   INDICATIONS:  Mr. Aughenbaugh is a 75 year old married white male with a history  of coronary artery disease status post inferior wall myocardial infarction  January 13, 2000 treated with percutaneous transluminal coronary angioplasty  of the RCA by Dr. Caprice Kluver.  Other problems include hyperlipidemia, tobacco  abuse.  He was admitted October 11 with unstable angina.  He had nonspecific  EKG changes and ruled out for myocardial infarction.  He presents now for  diagnostic coronary arteriography.   PROCEDURE DESCRIPTION:  The patient was brought to the second floor [facility] in the postabsorptive state.  He was  premedicated with p.o. Valium and IV Nubain.  His right groin was prepped  and shaved in the usual sterile fashion.  1% Xylocaine was used for local  anesthesia.  A 6 French sheath was inserted into the right femoral artery  using standard Seldinger technique. A 6 French right and left Judkins  diagnostic catheter as well as 6 French pigtail catheter were used for  selective coronary angiography,  left ventriculography, subselective left  internal mammary angiography and distal abdominal aortography.  Omnipaque  dye was used for the entirety of the case.  Retrograde aorta, ventricular  and pullback pressures were recorded.   HEMODYNAMICS:  1. Aortic systolic pressure 153, diastolic pressure 34.  2. Left ventricular systolic pressure 153, end-diastolic pressure 8.   SELECTIVE CORONARY ANGIOGRAPHY:  1. Left main normal.  2. LAD:  The LAD had 70-80% bifurcation  lesion at the first diagonal branch.  3. Left circumflex:  Free of significant disease.  4. Ramus intermedius branch:  Free of significant disease.  5. Right coronary artery:  Dominant with 60-70% segmental mid stenosis.  6. The PDA was widely patent which apparently was the site of prior PCI.   LEFT VENTRICULOGRAPHY:  RAO left ventriculogram was performed using 25 mL of  Omnipaque dye at 12 mL per second.  The overall LVEF was estimated at  greater than 60% without focal wall motion abnormalities.   Left internal mammary artery:  This vessel was subselectively visualized and  was widely patent.  It was suitable for use during coronary artery bypass  grafting.   DISTAL ABDOMINAL AORTOGRAPHY:  Distal abdominal aortogram was performed  using 20 mL of Omnipaque dye at 20 mL per second.  The renal arteries were  widely patent with at most 40% stenosis bilaterally.  The infrarenal  abdominal aorta and the iliac bifurcation appeared free of  significant  atherosclerotic disease.   IMPRESSION:  Mr. Lupton has LAD bifurcation disease.  Options include  coronary artery bypass grafting versus complex bifurcation PCI and stenting.  I believe that this is percutaneously addressable and after discussion with  the patient had decided to proceed with stenting procedure.   PROCEDURE DESCRIPTION:  Existing 6 French sheath in right femoral artery was  exchanged over wire for a 7 [place] sheath.  A 6 French sheath was then  inserted into the right femoral vein.  The patient had received aspirin this  morning, received 600 mg of p.o. clopidogrel, and total of 5000 units of  heparin with ending ACT of  262.  Received Integrelin double bolus and  infusion.   Using a 7 [place] JL-3.5 guide catheter along with two 0.0014 support guide  wires and a 2.5/10 cutting balloon, PCI was performed of the LAD and  diagonal branch bifurcation.  Omnipaque dye was used for the entirety of the  case.  Retrograde aortic  pressure was monitored during the case.  The  patient received 200 mcg of intracoronary nitroglycerin several times during  the case.  Initial IVUS interrogation was performed at the LAD and  bifurcation of the diagonal revealing heavy plaque burden in the LAD with  distal reference segment of 2.5 with large plaque burden at the origin of  the LAD just after the diagonal takeoff.  There was also eccentric plaque in  the proximal LAD proximal to the diagonal takeoff, but the reference segment  there was 3.3.   After cutting balloon angioplasty was performed, a 2.5/16 Taxus stent was  then advanced into the diagonal branch.  Attempts were made to do initial  kissing balloon stenting.  However, the second stent was unable to traverse  the guide catheter and thus a 2.5/12 Quantum balloon was advanced over the  LAD wire.  The diagonal stent was positioned angiographically and  fluoroscopically just at the ostium of the diagonal and slightly into the  LAD.  It was deployed at 12-14 atmospheres with simultaneous inflation of  the Quantum Maverick in the LAD (a kissing balloon fashion).  Following  this, another 2.5/16 Taxus was then advanced over the LAD wire just to the  bifurcation and the 2.5/15 Quantum was placed into the diagonal stent.  Both  were inflated to 12-14 atmospheres.  200 mcg of IC nitroglycerin was then  administered.  The final angiographic result was reduction of 80% LAD  diagonal branch bifurcation lesion to 0% residual with TIMI-3 flow.  The  patient tolerated the procedure well.  He did become moderately hypotensive  with a systolic blood pressure in the 70 range which easily resolved with  fluid resuscitation.   IMPRESSION:  Successful IVUS-guided left anterior descending diagonal branch  percutaneous coronary intervention using kissing balloon stenting technique  and drug-eluting stents with adjunctive glycoprotein 2b3a inhibition.  The guide wire and catheters removed.  The sheaths were sewn securely in place.  The patient left lab in stable condition.  Sheaths will be removed once the  ACT falls below 150.  Heparin was discontinued.  Integrelin will continue  for 18 hours.  The patient will be treated with aspirin and Plavix and will  be discharged home in the morning if he remains clinically stable overnight.  He left the lab in stable condition.  Nanetta Batty, M.D.    JB/MEDQ  D:  07/14/2003  T:  07/14/2003  Job:  [id number]   cc:   [facility]   Buchanan. Kriste Basque, M.D. [facility]

## 2011-02-17 NOTE — Cardiovascular Report (Signed)
[facility]  Patient:    Eric [name], Eric [name]                         MRN: [id number] Proc. Date: 01/13/00 Attending:  Thereasa Solo. Little, M.D. CC:         Runell Gess, M.D.             Thereasa Solo. Little, M.D.                        Cardiac Catheterization  INDICATIONS FOR TEST:  Mr. Slivinski is a 75 year old male, who presents to the emergency room with an acute inferior/posterior myocardial infarction of about .5 hour duration.  PROCEDURE: 1. Left heart catheterization. 2. Selective right and left coronary arteriography. 3. Ventriculography. 4. Temporary pacemaker insertion. 5. Angioplasty of distal right coronary artery.  Patient was prepped and draped in usual sterile fashion, exposing the right groin. Applying local anesthetic of 1% Xylocaine.  The Seldinger technique was employed and a 6-French introducer sheath was placed into the right femoral vein and a 7-French introducer sheath in the right femoral artery.  Selective right and left coronary arteriography and ventriculography in the RAO projection was performed.  A 5-French temporary pacemaker was placed in the apex of the right ventricle with  good capture.  Following this, acute intervention to the RCA was performed.  RESULTS:  HEMODYNAMIC MONITORING:  Central aortic pressure was 139/77.  Left ventricular pressure 139/31 with no significant aortic valve gradient noted at the time of pullback.  VENTRICULOGRAPHY:  Ventriculography in the RAO projection revealed the posterior basilar segment and the distal portion of the inferior wall to be mildly hypokinetic.  The ejection fraction was greater than 50%.  No mitral regurgitation was seen.  The end-diastolic pressure was 27.  CORONARY ARTERIOGRAPHY:  No calcification was noted on fluoroscopy.  LEFT MAIN:  Normal.  LAD:  The LAD extended down to the apex of the heart.  It had minimal irregularities in the proximal and mid  segment.  The first diagonal had a proximal 50% area of narrowing just distal to its ostium.  OPTIONAL DIAGONAL:  Ostial 40-50% narrowing.  CIRCUMFLEX:  The circumflex gave rise to one moderate sized OM vessel and was free of disease.  RIGHT CORONARY ARTERY:  The right coronary artery was very tortuous vessel with  proximal mild irregularities.  The distal RCA just proximal to the PDA had an eccentric 95% area of narrowing.  There was TIMI 3 flow.  Because of the subtotal distal right coronary artery, arrangements were made for intervention.  A JL-4 catheter would not adequately cannulate the RCA, so a 3.5  7-French guide catheter with side holes was used.  A 182 cm ______ wire and a 3.0 x 20 CrossSail balloon was used.  The wire was passed distal to the area of the  obstruction in the RCA.  The balloon was inflated initially at 6 atmospheres x 0 and then finally at 10 atmospheres x 62 seconds.  With this, there was less than 10% residual narrowing without any evidence of a dissection or thrombus.  About 5 to 10 minutes later, repeat evaluation still showed minimal irregularity following the angioplasty and because of this, a stent was not placed.  During the procedure, the patient was given IV integrilin and a bolus of IV heparin in addition to oral Plavix and aspirin, which was given in the emergency room.  For now, I would not use beta-blockers because of the speeding and slowing that he ad had initially. DD:  01/13/00 TD:  01/13/00 Job: [id number]

## 2011-02-17 NOTE — H&P (Signed)
Eric [name], Eric [name]                          ACCOUNT NO.:  [id number]   MEDICAL RECORD NO.:  [id number]                   PATIENT TYPE:  INP   LOCATION:  3733                                 FACILITY:  [facility]   PHYSICIAN:  Nanetta Batty, M.D.                DATE OF BIRTH:  1936/05/06   DATE OF ADMISSION:  07/13/2003  DATE OF DISCHARGE:                                HISTORY & PHYSICAL   CHIEF COMPLAINT:  Chest pain, dizziness and vertigo.   BRIEF HISTORY:  The patient is a 75 year old white male with a history of  angina in 1992 who was ruled out for myocardial infarction.  He returned  January 13, 2000 with an inferior posterior myocardial infarction.  He  underwent cardiac catheterization at that time by Dr. Julieanne Manson.  Catheterization revealed a normal left main, the LAD had minimal  irregularities.  First diagonal had a 50% stenosis distal to the ostium.  The circumflex showed moderate OM free of disease.  The RCA proximal to the  PDA had an eccentric 95% narrowing and a PTA of the right coronary artery  was performed.  The patient recovered and a Cardiolite obtained on May 04, 2003 showed no perfusion abnormalities.  Ejection fraction was 59% with no  ischemia.  Today after moving barrels off a truck he sat down to do some  paper work; he developed some left chest pain over his breast lasting 10 to  15 seconds followed by dizziness and vertigo.  He said it was like he was on  a crazy ride.  He had to hold onto a shelf while seated in order to maintain  his stability.  It finally resolved and he drove home.  He still had some  dizziness at this point along with some chest discomfort.  He was very  anxious and his family brought him to the emergency room.  He still has some  chest tightness but is more concerned about his vertigo.  He has no  shortness of breath now but he had some during his original episode which  lasted approximately 45 minutes.  Currently he is still  having discomfort  that feels like his symptoms he had back in 1992 where he said if he could  burp it would get better.   PAST MEDICAL HISTORY:  1. Rule out an myocardial infarction in 1992.  2. Inferior posterior myocardial infarction with cardiac catheterization and     percutaneous transluminal coronary angioplasty of the right coronary     artery on January 13, 2000.  3. Hyperlipidemia  4. Remote history of tobacco use.  5. Tonsils and adenoids as a child.  6. Removal of bone spurs right elbow.   FAMILY HISTORY:  Positive for diabetes in his sister.  His father died of  congestive heart failure.  Mother died of a stroke.  He has one brother with  a history of myocardial infarction.   SOCIAL HISTORY:  He is married; four children.  He works as a Naval architect.  He smoked a pack a day for approximately 15 years but quit 37 years ago.  ETOH:  None in the last 10-12 years. Drugs:  None.   REVIEW OF SYSTEMS:  CARDIOVASCULAR:  Negative.  CARDIAC:  Some tightness  with rapid exertion in the past.  PULMONARY:  Negative.  GI:  No history of  hemoccult positive stools.  He has some occasional constipation.  GU:  Negative.  EXTREMITIES:  Negative.   CURRENT MEDICATIONS:  1. Zetia 10 mg daily.  2. Lipitor 40 mg daily.  3. Toprol XL 25 mg daily.  4. Norvasc 5 mg daily.  5. Ecotrin 325 mg daily.  6. Multivitamin one daily.  7. Niaspan one gram daily at h.s.   PHYSICAL EXAMINATION:  GENERAL:  This is a 75 year old white male, anxious  but in no acute distress.  VITAL SIGNS:  Blood pressure is 180/84, heart rate 84, respirations 18.  EKG  showed sinus rhythm with PACs and sinus pauses.  He had increased Q's in 2,  3 and aVF but these are marginal changes from prior EKGs.  HEENT:  Normocephalic.  Eyes - pupils are equal, round and reactive to light  and accommodation.  Extraocular movements are intact.  Fundi not visualized.  NECK:  No bruits, no JVD, no thyromegaly.  CARDIAC:  Normal S1  and S2, no murmurs, rubs, or gallops.  CHEST:  Clear to auscultation and percussion.  ABDOMEN:  Soft, nontender, positive bowel sounds, no hepatosplenomegaly.  EXTREMITIES:  No clubbing, cyanosis or edema.   LABORATORY DATA:  First CK was less than one, myoglobin was 62.8, troponin  was less than 0.05.  His white count is 11,400, hemoglobin 13.9, hematocrit  40, platelets are 247,000.  Electrolytes show a sodium of 141, potassium of  4.1, chloride 105, CO2 31, BUN of 12, creatinine 0.8, glucose 107.   IMPRESSION:  1. Rule out myocardial infarction.  2. History of coronary artery disease with inferior posterior myocardial     infarction, percutaneous transluminal coronary angioplasty of the right     coronary artery in 2001.  3. Hyperlipidemia  4. Hypertension.  5. Vertigo after his chest pain earlier today.   PLAN:  1. Rule out myocardial infarction with cardiac enzymes and EKGs.  2. Heparin and nitroglycerin over night.  3. Hypertension, blood pressure control.  4. Further work up and treatment as indicated.      Eber Hong, P.A.                 Nanetta Batty, M.D.    WDJ/MEDQ  D:  07/13/2003  T:  07/14/2003  Job:  [id number]   cc:   Lonzo Cloud. Kriste Basque, M.D. [facility]

## 2011-02-17 NOTE — H&P (Signed)
[name], [name]                          ACCOUNT NO.:  [id number]   MEDICAL RECORD NO.:  [id number]                   PATIENT TYPE:  OBV   LOCATION:  6524                                 FACILITY:  [facility]   PHYSICIAN:  Quita Skye. Waldon Reining, MD             DATE OF BIRTH:  Aug 29, 1936   DATE OF ADMISSION:  07/30/2003  DATE OF DISCHARGE:                                HISTORY & PHYSICAL   IDENTIFYING DATA AND CHIEF COMPLAINT:  Eric [name] is a 75 year old white  man who is admitted to [facility] for further evaluation of chest  pain.   HISTORY AND PHYSICAL:  The patient has a history of coronary artery disease,  which dates back to April 2001.  He was hospitalized for an inferior  posterior myocardial infarction.  He underwent cardiac catheterization,  which revealed abnormal left main, an LAD with minimal irregularities, a  first diagonal with 50% stenosis distal to the ostium, a circumflex with a  moderate obtuse marginal free of disease, and a right coronary artery with  an eccentric 95% narrowing proximal to the PDA.  PTCA of the right coronary  artery was performed.  His course was uncomplicated until two weeks ago when  he returned with unstable angina.  Cardiac catheterization revealed a normal  left main, a 70-80% bifurcation lesion in the LAD at the site of the first  diagonal, a circumflex free of significant disease, a ramus intermedius  branch free of significant disease, and a dominant right coronary artery  with a 60-70% segmental mid stenosis.  The PDA was widely patent.  He then  underwent successful IVUS-guided left anterior descending diagonal branch  percutaneous coronary intervention using kissing balloon stenting technique  and drug-eluting stent with adjunctive glycoprotein IIb IIIa inhibition.  He  was discharged 15 days ago.   The patient was asymptomatic until this afternoon when he experienced an  episode of chest pain while driving.  This is  described as a substernal  pressure similar in quality to his prior cardiac chest pain.  He took two  nitroglycerin tablets while driving, but they did not relieve his pain.  When he got home he took another two nitroglycerin tablets.  His pain was  ultimately relieved after a duration of approximately 2.5 hours.  He is free  of chest pain at this time.  The chest pain is not associated with dyspnea,  diaphoresis or nausea.  There are no exacerbating or ameliorating factors.  It appears to be unrelated to position, activity, meals or respirations.   The patient has no history of congestive heart failure or arrhythmia.   PAST MEDICAL HISTORY:  In addition to the cardiac disease described above  the patient has:  1. History of hypertension.  2. Hyperlipidemia.  3. Bilateral renal artery stenosis (approximately 40%) was found at the time     of his recent cardiac catheterization.  ALLERGIES:  The patient is not allergic to any medications.   MEDICATIONS:  The patient's current medications include:  1. Zetia 10 mg p.o. daily.  2. Plavix 75 mg p.o. daily.  3. Aspirin 81 mg p.o. daily.  4. Toprol XL 50 mg p.o. daily.  5. Lipitor 40 mg p.o. daily.  6. Norvasc 5 mg p.o. daily.  7. Niaspan 1 gram p.o. q.h.s.  8. Nitroglycerin SL p.r.n.   PAST SURGICAL HISTORY:  Previous operations consists of:  1. Removal of bone spur on right elbow.  2. Tonsillectomy and adenoidectomy as a child.   ACCIDENTS AND INJURIES:  Significant injuries; none.   FAMILY HISTORY:  The patient's sister has diabetes.  His father died of  congestive heart failure.  His mother died of a cerebrovascular accident.  A  brother has coronary artery disease.   SOCIAL HISTORY:  The patient is married,  he has four children.  He  continues to work as a Naval architect.  There is a remote history of tobacco  abuse.  There is no significant history of ethanol consumption.   REVIEW OF SYSTEMS:  Review of systems reveals no  new problems related to his  head, eyes, ears, nose, mouth, throat, lungs, gastrointestinal system,  genitourinary system, or extremities.  There is no history of neurologic or  psychiatric disorder.  There is no history of fever, chills or weight loss.   PHYSICAL EXAMINATION:  VITAL SIGNS:  Blood pressure 132/71, pulse 66 and  regular, respirations 18, and temperature 97.1.  GENERAL APPEARANCE:  The patient is a middle-aged white man in no  discomfort.  He is alert, oriented, appropriate and responsive.  HEENT:  Head, eyes, nose and mouth are normal.  NECK:  The neck is without thyromegaly or adenopathy.  Carotid pulses are  palpable bilaterally without bruits.  HEART:  Cardiac examination reveals a normal S1 and S2.  There is no S3, S4,  murmur, rub, or click.  Cardiac rhythm is regular.  CHEST:  No chest wall tenderness is noted.  LUNGS:  The lungs are clear.  ABDOMEN:  The abdomen is soft and nontender.  There is no mass,  hepatosplenomegaly, bruit, distention, rebound, guarding, or rigidity.  Bowel sounds are normal.  RECTAL AND GENITALIA:  Rectal and genital examinations are not performed as  they are not pertinent to the reason for acute care hospitalization.  EXTREMITIES:  The extremities are without edema, deviation or deformity.  Radial and dorsalis pedal pulses are palpable bilaterally.  NEUROLOGIC EXAMINATION:  Brief screen neurologic examination is  unremarkable.   LABORATORY DATA:  The electrocardiogram reveals normal sinus rhythm with  sinus arrhythmia.  The tracing is otherwise unremarkable.  The chest  radiograph report is pending at the time of this dictation.  Potassium is  3.7 with a BUN of 11 and creatinine of 0.9.  White count is 8.7 wit a  hemoglobin of 13.0 and hematocrit of 37.1.  Initial set of cardiac enzymes  reveal a CK MB of less than 1.0, myoglobin 56.9 and troponin less than 0.05. The second set reveals a CK MB of less than 1.0 with a myoglobin of 43.1  and  troponin of less than 0.05.  The remaining studies are pending at the time  of this dictation.   IMPRESSION:  1. Chest pain, rule out unstable angina.  2. Coronary artery disease status post right coronary artery percutaneous     transluminal coronary angioplasty in 2001.  3. Two weeks status  post left anterior descending diagonal bifurcation     stent; residual 60-70% right coronary artery disease.  4. Hypertension.  5. Hyperlipidemia.  6. Bilateral renal artery stenosis.   PLAN:  1. Telemetry.  2. Serial cardiac enzymes.  3. Aspirin.  4. Plavix.  5. Continue Toprol XL.  6. Lovenox.  7. Nitrol paste.  8. Further management per Dr. Allyson Sabal.                                                  Quita Skye. Waldon Reining, MD    MSC/MEDQ  D:  07/30/2003  T:  07/31/2003  Job:  [id number]

## 2011-02-17 NOTE — Discharge Summary (Signed)
NAMESHONE, LEVENTHAL                          ACCOUNT NO.:  [id number]   MEDICAL RECORD NO.:  [id number]                   PATIENT TYPE:  INP   LOCATION:  6527                                 FACILITY:  [facility]   PHYSICIAN:  Nanetta Batty, M.D.                DATE OF BIRTH:  12-29-1935   DATE OF ADMISSION:  07/13/2003  DATE OF DISCHARGE:  07/15/2003                                 DISCHARGE SUMMARY   ADMISSION DIAGNOSES:  1. Unstable angina.  2. History of coronary artery disease.     a. History of myocardial infarction in 1992 with percutaneous        transluminal coronary angioplasty to the right coronary artery at that        time.  3. Hyperlipidemia.  4. Hypertension.  5. Remote tobacco use.  6. Bone spur on the right elbow.   DISCHARGE DIAGNOSES:  1. Unstable angina.  2. History of coronary artery disease.     a. History of myocardial infarction in 1992 with percutaneous        transluminal coronary angioplasty to the right coronary artery at that        time.  3. Hyperlipidemia.  4. Hypertension.  5. Remote tobacco use.  6. Bone spur on the right elbow.  7. Status post bradycardia; discharged home on decreased Toprol dose.  8. Status post cardiac catheterization on July 14, 2003, with kissing     percutaneous transluminal coronary angioplasty stent to the proximal LAD     and diagonal vessel. Residual 60% to 70% mid right coronary artery     stenosis.  9. A 40% bilateral renal artery stenosis at catheterization.  10.      Normal left ventricular function.   HISTORY OF PRESENT ILLNESS:  Mr. Butler is a 75 year old white male with a  history of PTCA in 1992. He underwent repeat catheterization in April 2001  with PTA to the RCA at that point.  He had a follow-up Cardiolite on May 04, 2003, that showed no perfusion abnormalities, EF 59%, no ischemia   Concomitant problems include hypertension, dyslipidemia, and remote tobacco  use.   On the day of admission,  after moving some barrels off a truck, he sat down  to do some paperwork. He then developed some left-sided chest pain over the  left breast that lasted about 10-15 seconds, followed by dizziness and  vertigo. He said it was like he was on a crazy ride. He had to hold onto a  shelf while he got himself seated. It finally resolved and he was able to  drive himself home.  The family brought him to the emergency department. He  was very anxious at that point, still having some chest tightness, but was  more concerned about his vertigo.  He was having no shortness of breath at  this point, but did during the original episode for  about 45 minutes.  He  was currently still having some chest discomfort that felt like it might be  GI symptoms and he felt as if he could barf he would feel better.   PHYSICAL EXAMINATION:  On exam, his blood pressure was elevated at 180/84,  heart rate 84. EKG showed sinus rhythm with PACs and sinus pause.  No other  significant abnormality on physical exam.   The first set of enzymes was negative.   At this point he was seen and evaluated by Dr. Domingo Sep.  It was felt that  he was experiencing unstable angina and was planned for admission to  telemetry, check serial enzymes, and rule out MI.  We will plan for  definitive cardiac catheterization. The risks and benefits of the procedure  were discussed, and he is willing to proceed.   HOSPITAL COURSE:  On July 14, 2003, that morning he had had some very  short pauses, sinus bradycardia.  The Toprol dose was decreased. Otherwise,  he was stable and planned for cardiac catheterization.   On July 14, 2003, he underwent cardiac catheterization by Dr. Nanetta Batty. He was found to have high-grade LAD diagonal bifurcation disease of  about 80%. Dr. Allyson Sabal proceeded with kissing PTCA stent technique. Both the  LAD and diagonal stenoses went from 80% to 0% residual.  The only other  significant residual disease  was a 60% to 70% mid RCA stenosis. He had  normal LV function. There was 40% bilateral renal artery stenosis. The  patient tolerated the procedure well and had no complications.  He planned  to turn the heparin off, continue Integrilin for 18 hours, continue aspirin  and Plavix and planned for home in the morning if home.   On the morning of July 15, 2003, Mr. Minton is doing well.  He is without  chest pain, shortness of breath, and no complications with his groin site.  He is afebrile at 97.3, pulse 85, blood pressure 130/60, oxygen saturation  95% on room air.  Labs are stable post procedure including creatinine of  0.9, negative enzymes. Telemetry showed sinus rhythm with no further pauses  or bradycardia.  At this point his lungs were clear. Heart showed a regular  rhythm. His groin sites healed without hematoma or bleed.   At this point he is seen and evaluated by Dr. Allyson Sabal who deems him stable for  discharge home.   HOSPITAL CONSULTATIONS:  None.   HOSPITAL PROCEDURES:  1. Cardiac catheterization on July 14, 2003, by Dr. Allyson Sabal. He was found     to have a high-grade LAD diagonal bifurcation stenosis of 80%. He     proceeded with kissing stent technique to these and they both went from     80% stenosis to 0% residual. There was no other residual coronary     disease, except for one area of 60% to 70% stenosis in the mid RCA.  He     had normal LV function, 40% bilateral renal artery stenosis.  He     tolerated the procedure well without complications.  See Dr. Hazle Coca     dictated note for details.  2. EKG showed normal sinus rhythm with rate of 92 beats per minute, inferior     Q waves, nonspecific ST-T changes.  Telemetry was notable for some     bradycardia/slight pause and beta blocker dose was decreased.   LABORATORY DATA:  TSH normal at 2.060.  On admission, white count 9.3, hemoglobin  13.2, hematocrit 38.2, platelet count 262,000. Sodium 143,  potassium 4.3,  glucose 142, BUN 10, creatinine 0.9, magnesium 2. Cardiac  enzymes showed CK of 81, MB 1.4, troponin 0.01.  Another set of enzymes show  CKs of 116 and 81, MB 1.9 and 1.4, and troponin of 0.01 and 0.01. Cardiac  marker in the emergency department  was  negative with myoglobin 52.8 and MB  less than 0.10, troponin less than 0.05. Liver profile showed total  cholesterol 117, triglycerides 67,  HDL 44, LDL 60. Liver function tests  were normal.  At the time of discharge home, labs were stable with sodium of  140, potassium 4.3, BUN 8, creatinine 0.9, glucose 116.  Enzymes were all  negative.   DISCHARGE MEDICATIONS:  1. Plavix 75 mg once a day.  2. Aspirin 81 mg once a day.  3. Toprol XL 50 mg once a day.  4. Zetia 10 mg once a day.  5. Lipitor 40 mg once a day.  6. Norvasc 5 mg once a day.  7. Niaspan 1000 mg at night.  8. Nitroglycerin 0.4 mg sublingual as directed.   DISCHARGE INSTRUCTIONS:  1. No strenuous activity, do not lift greater than 5 pounds, to have no     sexual activity for three days.  2. Low-cholesterol  diet.  3. May gently wash her groin site with warm water and soap.  4. Call [phone] if any bleeding or increased pain at groin site.  5. Follow up with Dr. Allyson Sabal on August 07, 2003, at 2:15 p.m.        Mary B. Remer Macho, P.A.-C.                   Nanetta Batty, M.D.    MBE/MEDQ  D:  07/15/2003  T:  07/15/2003  Job:  [id number]   cc:   Nanetta Batty, M.D.  [address]  [place]  [place] [address]  Fax: [phone]   Lonzo Cloud. Kriste Basque, M.D. [facility]

## 2011-02-17 NOTE — Cardiovascular Report (Signed)
Eric [name], Eric [name]                          ACCOUNT NO.:  [id number]   MEDICAL RECORD NO.:  [id number]                   PATIENT TYPE:  OBV   LOCATION:  6524                                 FACILITY:  [facility]   PHYSICIAN:  Madaline Savage, M.D.             DATE OF BIRTH:  05-Feb-1936   DATE OF PROCEDURE:  DATE OF DISCHARGE:                              CARDIAC CATHETERIZATION   PROCEDURES PERFORMED:  1. Selective coronary angiography by Judkins' technique.  2. Percutaneous coronary intervention of the mid right coronary artery.   COMPLICATIONS:  None.   ENTRY SITE:  Right femoral.   DYE USED:  Omnipaque.   PATIENT PROFILE:  The patient is a 75 year old gentleman who had undergone  percutaneous coronary intervention to the left anterior descending and  diagonal branch of the LAD 17 days ago (July 14, 2003).  He had chest  pain last evening and entered he hospital and has had negative enzymes.  Today he enters the catheterization laboratory for restudy.   RESULTS:  The left main coronary artery was normal.  The left anterior  descending coronary artery and LA and diagonal branch both have radiopaque  stents in place and both pristine with no in-stent restenosis.  There is  TIMI 3 distal flow, and there is no evidence of any dysfunction in either of  these vessels whatsoever.  There is no spasm and no new areas of blockage.  The intermediate ramus branch is normal.  The left circumflex branch is  normal.   The right coronary artery is large and dominant.  There is a segmental  stenosis of about 10-12 mm in length.  At its worse point, it is 75%  stenotic in its diameter.  It is in the mid portion of the vessel just  beyond an acute marginal branch.   LEFT VENTRICULAR ANGIOGRAPHY:  Not performed.   PERCUTANEOUS CORONARY INTERVENTION:  Accomplished by a right percutaneous  femoral approach using a 6-French introducer sheath and a 6-French right  guide catheter with side  holes.  The guide wire was a Radiographer, therapeutic.  No pre  dilatation was required.  Direct stenting was accomplished with a 3.0 x18 mm  CYPHER stent deployed to 15 atmospheres on two occasions.  On both  occasions, the patient has severe ST-segment elevations and chest pain that  was compatible with his outpatient angina.   EKG changes resolved.  The chest pain eventually resolved after the  procedure before he left the catheterization laboratory, and he had stable  vital signs on discharge.    FINAL DIAGNOSES:  1. Patent left anterior descending and diagonal stents placed July 14, 2003.  2. A 75% mid ostial stenosis reduced to 0% residual on today.  Madaline Savage, M.D.    WHG/MEDQ  D:  07/31/2003  T:  07/31/2003  Job:  [id number]   cc:   Nanetta Batty, M.D.  [address]  [place]  [place] [address]  Fax: [phone]   Catheterization Laboratory

## 2011-02-23 ENCOUNTER — Telehealth: Payer: Self-pay | Admitting: Pulmonary Disease

## 2011-02-23 MED ORDER — DIAZEPAM 5 MG PO TABS: 5.0000 mg | ORAL_TABLET | Freq: Three times a day (TID) | ORAL | Status: DC | PRN

## 2011-02-23 NOTE — Telephone Encounter (Signed)
Pt aware we can fill diazepam x 1 and he will need ov for further refills. Appt with SN on 03/28/2011 @ 2 pm.

## 2011-02-23 NOTE — Telephone Encounter (Signed)
Can give 1 refill but pt needs ov first. No refills.  thanks

## 2011-02-23 NOTE — Telephone Encounter (Signed)
Pt is requesting a refill on diazepam. Last refill was sent on 11-25-10 #90 with 1 refill, but the pharmacy read it as only #90 not an additional refill. I called the pt to get his 1 yr appt set first, but he state he needs to look at his schedule and call us back. Please advise if the pt sets an appt if it is ok to refill medication. Carron Curie, CMA

## 2011-03-21 ENCOUNTER — Other Ambulatory Visit: Payer: Self-pay | Admitting: Pulmonary Disease

## 2011-03-22 ENCOUNTER — Telehealth: Payer: Self-pay | Admitting: Adult Health

## 2011-03-22 NOTE — Telephone Encounter (Signed)
meds have been refilled by JR.  Pt will need to keep his appt for next month;called and spoke with pt and he is aware

## 2011-03-27 ENCOUNTER — Encounter: Payer: Self-pay | Admitting: Pulmonary Disease

## 2011-03-28 ENCOUNTER — Ambulatory Visit: Payer: Medicare Other | Admitting: Pulmonary Disease

## 2011-04-10 ENCOUNTER — Telehealth: Payer: Self-pay | Admitting: Pulmonary Disease

## 2011-04-10 MED ORDER — DIAZEPAM 5 MG PO TABS: 5.0000 mg | ORAL_TABLET | Freq: Three times a day (TID) | ORAL | Status: DC | PRN

## 2011-04-10 NOTE — Telephone Encounter (Signed)
Spouse says pt could not keep the last appt in Apr 01, 2023 due to a death in the family but has rescheduled for 05/01/2011 w/ SN. Pls advise if okay for another refill until appt.  Allergies  Allergen Reactions  . Sertraline Hcl     REACTION: disorientation

## 2011-04-10 NOTE — Telephone Encounter (Signed)
Refill sent. Pt aware.Perla Echavarria, CMA  

## 2011-04-10 NOTE — Telephone Encounter (Signed)
Yes that is fine, make sure he keeps ov  Give only rx for this month

## 2011-04-12 ENCOUNTER — Other Ambulatory Visit: Payer: Self-pay | Admitting: Pulmonary Disease

## 2011-05-01 ENCOUNTER — Other Ambulatory Visit (INDEPENDENT_AMBULATORY_CARE_PROVIDER_SITE_OTHER): Payer: Medicare Other

## 2011-05-01 ENCOUNTER — Encounter: Payer: Self-pay | Admitting: Pulmonary Disease

## 2011-05-01 ENCOUNTER — Ambulatory Visit (INDEPENDENT_AMBULATORY_CARE_PROVIDER_SITE_OTHER): Payer: Medicare Other | Admitting: Pulmonary Disease

## 2011-05-01 DIAGNOSIS — K589 Irritable bowel syndrome without diarrhea: Secondary | ICD-10-CM

## 2011-05-01 DIAGNOSIS — D126 Benign neoplasm of colon, unspecified: Secondary | ICD-10-CM

## 2011-05-01 DIAGNOSIS — I739 Peripheral vascular disease, unspecified: Secondary | ICD-10-CM

## 2011-05-01 DIAGNOSIS — F411 Generalized anxiety disorder: Secondary | ICD-10-CM

## 2011-05-01 DIAGNOSIS — I2510 Atherosclerotic heart disease of native coronary artery without angina pectoris: Secondary | ICD-10-CM

## 2011-05-01 DIAGNOSIS — I10 Essential (primary) hypertension: Secondary | ICD-10-CM

## 2011-05-01 DIAGNOSIS — N200 Calculus of kidney: Secondary | ICD-10-CM

## 2011-05-01 DIAGNOSIS — R42 Dizziness and giddiness: Secondary | ICD-10-CM

## 2011-05-01 DIAGNOSIS — I679 Cerebrovascular disease, unspecified: Secondary | ICD-10-CM

## 2011-05-01 DIAGNOSIS — IMO0001 Reserved for inherently not codable concepts without codable children: Secondary | ICD-10-CM

## 2011-05-01 DIAGNOSIS — E785 Hyperlipidemia, unspecified: Secondary | ICD-10-CM

## 2011-05-01 LAB — BASIC METABOLIC PANEL
CO2: 28 mEq/L (ref 19–32)
Chloride: 102 mEq/L (ref 96–112)
Glucose, Bld: 104 mg/dL — ABNORMAL HIGH (ref 70–99)
Potassium: 3.8 mEq/L (ref 3.5–5.1)
Sodium: 139 mEq/L (ref 135–145)

## 2011-05-01 MED ORDER — MECLIZINE HCL 25 MG PO TABS: ORAL_TABLET | ORAL | Status: DC

## 2011-05-01 MED ORDER — DIAZEPAM 5 MG PO TABS: 5.0000 mg | ORAL_TABLET | Freq: Three times a day (TID) | ORAL | Status: DC | PRN

## 2011-05-01 MED ORDER — SPIRONOLACTONE-HCTZ 25-25 MG PO TABS: ORAL_TABLET | ORAL | Status: DC

## 2011-05-01 NOTE — Patient Instructions (Signed)
Today we updated your med list in EPIC...    We refilled the Aldactazide, Diazepam, & Meclizine per your request...  Today we did your follow up blood work...    Please call the PHONE TREE in a few days for your results...    Dial [id number] & when prompted enter your patient number followed by the # symbol...    Your patient number is:   [id number]#  Eliminate the salt in your diet, elevate legs & wear support hose...  Let's plan a follow up visit recheck in 6 months, sooner as needed.Marland KitchenMarland Kitchen

## 2011-05-08 ENCOUNTER — Telehealth: Payer: Self-pay | Admitting: Pulmonary Disease

## 2011-05-08 NOTE — Telephone Encounter (Signed)
Please advise Dr. Kriste Basque, pt is requesting his lab results. Thanks  Carver Fila, CMA

## 2011-05-09 NOTE — Telephone Encounter (Signed)
Per SN---on hctz 25mg   Daily k 10 daily , renal function  And electrolytes are normal.  Cont the same meds.  Called and spoke with pt and he is aware of lab results.  Pt to call for any other questions

## 2011-05-14 ENCOUNTER — Encounter: Payer: Self-pay | Admitting: Pulmonary Disease

## 2011-05-14 NOTE — Progress Notes (Signed)
Subjective:    Patient ID: Eric [name], male    DOB: [birth year], 75 y.o.   MRN: [id number]  HPI 75 y/o WM here for a follow up visit... he has multiple medical problems as noted below...    ~  Apr10:  I last saw him 4/08 (on that occas for refill of Chlorazepate)... he has mult medical problems as listed below... recently he has had an extensive eval from the Cardiovasc perspective by DrBerry, and from the ENT standpoint from [facility]...  ~  March 23, 2010:  CPX> doing well, no complaints; his Meniere's is controlled but Aldactazide caused some breast tissue enlargement, and I offered to switch it to HCT but he declined the change; weight is up to 207# & we reviewed diet + exercise; Labs showed elv TG's & he is reminded to stay on a low fat diet...  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 He would like me to refill his Aldactazide, Meclizine, & Valium today> his renal function & K+ are OK... See prob list below>>   Current Problems:   DIZZINESS (ICD-780.4) & HEARING LOSS (ICD-389.9) - see extensive eval by Rosine Beat 3/10 w/ Audiogram showing high freq loss that was similar to study in 7/09... Labs essent norm x increased catecholamines on 24H urine (?poss related to meds & off Imipramine now)... MRI Brain w/ small vessel disease otherw neg... CT Abd neg- normal adrenals bilat... he was given a course of oral PRED- not much change in hearing & f/u w/ DrKraus > prob Meniere's disease- tube placed in right ear 6/10, started on Aldactazide, uses Meclizine & Valium Prn... he did see  DrLove 1/11 who agreed w/ the Dx.  HYPERTENSION (ICD-401.9) - controllled on meds per [facility]= METOPROLOL 50mg - 1.5 tabsBid, & ALDACTAZIDE 25-25 taken Bid... BP= 140/70 & he denies HA, visual symptoms, CP, palpit, syncope, etc... prev mild LE edema resolved w/ the diuretic... ~  2DEcho 7/09 by [facility] was normal... ~  2DEcho repeated by [facility] 4/12 but we don't have this report...  CORONARY ARTERY DISEASE (ICD-414.00) - on ASA 81mg /d + PLAVIX 75mg /d... known CAD followed by [facility]= he had both diagonal branch & RCA stents placed in 2004, and f/u cath Jan10 showed patent stents... ~  Myoview 11/09 by [facility] was negative... ~  Event recorder as part of his dizziness work up was reported no signif arrhythmias. ~  Myoview 3/12 showed neg- no ischemia or infarct, norm regional wall motion, EF= 73%... ~  Cath 4/12 by DrBerry showed patent grafts & normal LV...  CEREBROVASCULAR DISEASE (ICD-437.9) - hx of carotid disease followed by [facility] w/ CDopplers... ~  CDopplers w/ 60-79% right carotid stenosis... continues on ASA/ Plavix.  PERIPHERAL VASCULAR DISEASE (ICD-443.9) - known mild RAS followed by ultrasounds at the [facility] office... ~  Dopplers w/ 40% bilat RAS as noted...  HYPERLIPIDEMIA (ICD-272.4) - on LIPITOR 80mg /d, ZETIA 10mg /d, FISH OIL 1000mg Bid... ~  FLP's followed by [facility] (we don't have recent  numbers to review). ~  FLP 6/11 showed TChol 166, TG 425, HDL 32, LDL 78... may need fibrate- defer to [facility]. ~  Subsequent labs done by [facility]...  IRRITABLE BOWEL SYNDROME (ICD-564.1) & COLONIC POLYPS (ICD-211.3) - followed for GI by DrStark & last colonoscopy was 2/07 showing mult 3-54mm polyps= tubular adenomas and f/u plasnned 20yrs.  NEPHROLITHIASIS (ICD-592.0) - he is followed for Urology by DrGrapey and the pt tells me that he does his routine PSA screening as well- ?when this was done last? ~  labs here 6/11 showed PSA= 0.16  FIBROMYALGIA (ICD-729.1) - he has carried this diagnosis for yrs based on clinical  symptoms and was treated w/ Imipramine 25mg  Bid until it was discontinued 3/10...  ANXIETY (ICD-300.00) - on DIAZEPAM 5mg - 1/2 to 1 tab by mouth three times a day Prn... he reports a prev reaction to Zoloft.  NOTE: Hx of ABNORMAL URINARY CATECHOL/METANEPHRINES - eval by DrKraus, DrEllison, DrLove> CT Abd was neg & MIBG scan was also neg- no adrenal lesions seen... poss related to Imipramine Rx  Health Maintenance - he had a TETANUS shot in 2003... he had PNEUMOVAX in 2007... he also received the SHINGLES vaccine in Oct08... he also takes MVI, Protegra, Vit C...   Past Surgical History  Procedure Date  . Tube right ear     Outpatient Encounter Prescriptions as of 05/01/2011  Medication Sig Dispense Refill  . amLODipine (NORVASC) 2.5 MG tablet Take 2.5 mg by mouth daily.        . Ascorbic Acid (VITAMIN C) 1000 MG tablet Take 1,000 mg by mouth daily.       Marland Kitchen aspirin 81 MG tablet Take 81 mg by mouth daily.        Marland Kitchen atorvastatin (LIPITOR) 80 MG tablet Take 80 mg by mouth daily.        . clopidogrel (PLAVIX) 75 MG tablet Take 75 mg by mouth daily.        . diazepam (VALIUM) 5 MG tablet Take 1 tablet (5 mg total) by mouth every 8 (eight) hours as needed for anxiety or sleep.  90 tablet  5  . ezetimibe (ZETIA) 10 MG tablet Take 10 mg by mouth daily.        . fish oil-omega-3 fatty acids 1000 MG capsule Take 1 g by mouth daily.       . lansoprazole (PREVACID) 15 MG capsule Take 15 mg by mouth daily.        . meclizine (ANTIVERT) 25 MG tablet Take one tablet by mouth every 8 hours as needed for dizziness  90 tablet  5  . metoprolol (LOPRESSOR) 50 MG tablet Take 1 1/2  Tablet by mouth two times daily.       . Multiple Vitamin (MULTIVITAMIN) capsule Take 1 capsule by mouth daily.        Marland Kitchen spironolactone-hydrochlorothiazide (ALDACTAZIDE) 25-25 MG per tablet Take one tablet by mouth two times daily  60 tablet  6    Allergies  Allergen Reactions  . Sertraline Hcl     REACTION: disorientation      Current Medications, Allergies, Past Medical History, Past Surgical History, Family History, and Social History were reviewed in [employer] record.    Review of Systems       See HPI - all other systems neg except as noted...  The patient complains of weight gain and dyspnea on exertion.  The patient denies anorexia, fever, weight loss, vision loss, decreased hearing, hoarseness, chest  pain, syncope, peripheral edema, prolonged cough, headaches, hemoptysis, abdominal pain, melena, hematochezia, severe indigestion/heartburn, hematuria, incontinence, muscle weakness, suspicious skin lesions, transient blindness, difficulty walking, depression, unusual weight change, abnormal bleeding, enlarged lymph nodes, and angioedema.     Objective:   Physical Exam    WD, sl obese, 75 y/o WM in NAD... he has gained 24# to 208#... GENERAL:  Alert & oriented; pleasant & cooperative... HEENT:  [place]/AT, EOM-wnl, PERRLA, Fundi-benign, EACs-clear, TMs-wnl, NOSE-clear, THROAT-clear & wnl. NECK:  Supple w/ fairROM; no JVD; normal carotid impulses w/o bruits; no thyromegaly or nodules palpated; no lymphadenopathy. CHEST:  Clear to P & A; without wheezes/ rales/ or rhonchi. Bilat mild gynecomastia is present, no galactorrhea... HEART:  Regular Rhythm; without murmurs/ rubs/ or gallops. ABDOMEN:  Soft & nontender; normal bowel sounds; no organomegaly or masses detected. EXT: without deformities, mild arthritic changes; no varicose veins/ venous insuffic/ or edema. NEURO:  CN's intact; motor testing normal; sensory testing normal; gait normal & balance OK. DERM:  No lesions noted; no rash etc...   Assessment & Plan:   OSA>  He was studied at [facility] & treated w/ CPAP per [facility], we do not have copies of the sleep data... In light of his fatigue I suggested he consider a sleep follow up...  HBP>  Controlled on Metoprolol & Aldactazide; he also takes one potassium pill daily; we check  BMet & everything is OK> K=3.8, BUN=24, Creat=1.2  CAD>  Followed by [facility]; continue current meds, incr exercise program, f/u w/ Cards...  PAD & Cerebrovasc Dis>  On ASA/ Plavix- continue same & he will f/u w/ [facility] re Dopplers etc...  HYPERLIPIDEMIA>  On Lip80, Zetia10, FishOil & FLP was good last yr but due for f/u now... & he will ret...  GI> GERD, IBS, Polyps>  On Prevacid 15mg /d; last colon was 2007 & f/u due in 2012> he will contact DrStark regarding f/u colon...  GU>  He sees DrGrapey for f/u kidney stones and for his PSA screening...  FM> he has carried the ds of fibromyalgia in a male & some of his fatigue may be related...  Anxiety>  He requests refill of his Valium.Marland KitchenMarland Kitchen

## 2011-07-04 LAB — URINALYSIS, ROUTINE W REFLEX MICROSCOPIC
Hgb urine dipstick: NEGATIVE
Nitrite: NEGATIVE
Specific Gravity, Urine: 1.022
Urobilinogen, UA: 1
pH: 7

## 2011-07-04 LAB — CARDIAC PANEL(CRET KIN+CKTOT+MB+TROPI)
CK, MB: 1.4
CK, MB: 1.8
Relative Index: INVALID
Relative Index: INVALID
Total CK: 77
Troponin I: 0.01
Troponin I: 0.04

## 2011-07-04 LAB — LIPID PANEL
Cholesterol: 146
Total CHOL/HDL Ratio: 3.6

## 2011-07-04 LAB — CK TOTAL AND CKMB (NOT AT ARMC)
CK, MB: 1.7
Total CK: 138

## 2011-07-04 LAB — CBC
MCHC: 33.4
Platelets: 501 — ABNORMAL HIGH
RBC: 4.4
RDW: 12.8
WBC: 12.3 — ABNORMAL HIGH

## 2011-07-04 LAB — H. PYLORI ANTIBODY, IGG: H Pylori IgG: 1.3 — ABNORMAL HIGH

## 2011-07-04 LAB — LIPASE, BLOOD: Lipase: 25

## 2011-07-04 LAB — BASIC METABOLIC PANEL
Calcium: 9.1
Creatinine, Ser: 0.81
GFR calc Af Amer: >60
GFR calc non Af Amer: >60

## 2011-07-04 LAB — DIFFERENTIAL
Basophils Relative: 1
Lymphs Abs: 4
Monocytes Relative: 8
Neutro Abs: 5.2
Neutrophils Relative %: 51

## 2011-07-04 LAB — AMYLASE: Amylase: 77

## 2011-07-04 LAB — COMPREHENSIVE METABOLIC PANEL
Alkaline Phosphatase: 71
BUN: 16
Calcium: 8.5
Glucose, Bld: 119 — ABNORMAL HIGH
Total Protein: 6.9

## 2011-07-13 LAB — BASIC METABOLIC PANEL
CO2: 31
Chloride: 104
GFR calc non Af Amer: >60
Glucose, Bld: 96
Potassium: 4.5
Sodium: 140

## 2011-07-13 LAB — CBC
HCT: 41.8
Hemoglobin: 14.4
RDW: 12.1

## 2011-10-30 ENCOUNTER — Ambulatory Visit: Payer: Medicare Other | Admitting: Pulmonary Disease

## 2011-12-01 ENCOUNTER — Telehealth: Payer: Self-pay | Admitting: Pulmonary Disease

## 2011-12-01 MED ORDER — DIAZEPAM 5 MG PO TABS: 5.0000 mg | ORAL_TABLET | Freq: Three times a day (TID) | ORAL | Status: DC | PRN

## 2011-12-01 NOTE — Telephone Encounter (Signed)
Per SN- okay to refill med x 1 only and needs to schedule appt and keep this for any future refills will be given. Spoke with pt and notified him of this. Pt scheduled appt for 12/06/11 at 9:30 am and is aware to keep this appt for any future refills.  Refill called to pharm x 1 only.

## 2011-12-01 NOTE — Telephone Encounter (Signed)
Pt's wife call & pt is very upset that this has not been taken care of yet.  Requests this to be taken care of ASAP & please call pt's wife at [phone] when ready.  Antionette Fairy

## 2011-12-01 NOTE — Telephone Encounter (Signed)
SN, please advise if okay to refill diazepam for this pt. His last ov with you was on 05/01/11, was told to follow up in 6 months and cancelled last ov that was scheduled for Jan 2013. Please advise thanks!

## 2011-12-06 ENCOUNTER — Encounter: Payer: Self-pay | Admitting: Pulmonary Disease

## 2011-12-06 ENCOUNTER — Ambulatory Visit (INDEPENDENT_AMBULATORY_CARE_PROVIDER_SITE_OTHER): Payer: Medicare Other | Admitting: Pulmonary Disease

## 2011-12-06 VITALS — BP 140/78 | HR 74 | Temp 97.8°F | Ht 68.0 in | Wt 206.6 lb

## 2011-12-06 DIAGNOSIS — I10 Essential (primary) hypertension: Secondary | ICD-10-CM | POA: Diagnosis not present

## 2011-12-06 DIAGNOSIS — R0989 Other specified symptoms and signs involving the circulatory and respiratory systems: Secondary | ICD-10-CM

## 2011-12-06 DIAGNOSIS — I2510 Atherosclerotic heart disease of native coronary artery without angina pectoris: Secondary | ICD-10-CM | POA: Diagnosis not present

## 2011-12-06 DIAGNOSIS — N200 Calculus of kidney: Secondary | ICD-10-CM

## 2011-12-06 DIAGNOSIS — D126 Benign neoplasm of colon, unspecified: Secondary | ICD-10-CM

## 2011-12-06 DIAGNOSIS — Z125 Encounter for screening for malignant neoplasm of prostate: Secondary | ICD-10-CM

## 2011-12-06 DIAGNOSIS — R0600 Dyspnea, unspecified: Secondary | ICD-10-CM

## 2011-12-06 DIAGNOSIS — F411 Generalized anxiety disorder: Secondary | ICD-10-CM | POA: Diagnosis not present

## 2011-12-06 DIAGNOSIS — E785 Hyperlipidemia, unspecified: Secondary | ICD-10-CM

## 2011-12-06 DIAGNOSIS — R0609 Other forms of dyspnea: Secondary | ICD-10-CM | POA: Diagnosis not present

## 2011-12-06 MED ORDER — SPIRONOLACTONE-HCTZ 25-25 MG PO TABS: ORAL_TABLET | ORAL | Status: DC

## 2011-12-06 MED ORDER — DIAZEPAM 5 MG PO TABS: 5.0000 mg | ORAL_TABLET | Freq: Three times a day (TID) | ORAL | Status: DC | PRN

## 2011-12-06 NOTE — Progress Notes (Signed)
Subjective:    Patient ID: Eric [name], male    DOB: 09/01/1936, 76 y.o.   MRN: [id number]  HPI 76 y/o WM here for a follow up visit... he has multiple medical problems as noted below...    ~  March 23, 2010:  CPX> doing well, no complaints; his Meniere's is controlled but Aldactazide caused some breast tissue enlargement, and I offered to switch it to HCT but he declined the change; weight is up to 207# & we reviewed diet + exercise; Labs showed elv TG's & he is reminded to stay on a low fat diet...  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 He would like me to refill his Aldactazide, Meclizine, & Valium today> his renal function & K+ are OK... See prob list below>>  ~  December 06, 2011:  53mo ROV & Eric [name] reports doing well overall, no new complaonts or concerns, wants his Valium refilled...    OSA> on CPAP per DrKelly, [facility]; we don't have Sleep study data; states he's doing satis on mask/ CPAP; he follows w/ DrKelly once per yr...    HBP> on Meptop75Bid, Amlodip2.5, Aldactazide 25-25 Bid he says; BP=140/78 & he denies CP, palpit, syncope, SOB, edema, etc...    CAD> on ASA/ Plavix, plus other meds listed; doing satis w/o angina etc; he follow up w/ DrBerry every 30mo- last seen 9/12 & has f/u soon...    Cerebrovasc Dis> on ASA/ Plavix; he denies cerebral ischemic symptoms; CDopplers per [facility]- ?when last & our records indicate 60-79% RICA stenosis w/ bruit.    Hyperlipid> on Lip80, Zetia10, Fish Oil; FLP today showed TChol 125, TG 153, HDL 37, LDL 58; rec better low fat diet & incr  exercise.    GI> IBS, Polyps> on Prev15; he denies abd pain, n/v/d/c or blood in stools; last colon 2/07 & he is due for f/u DrStark...    Hx KidStones> he is followed by DrGrapey for Urology; no recent problems...    Anxiety> on Valium5mg  tid prn; he requests refill... LABS 3/13:  FLP- ok on Lip80&Zetia;  Chems- ok x BS=129 & needs low carb diet;  CBC- wnl;  TSH=1.63;  PSA=0.32;  BNP=45   PROBLEM LIST:    DIZZINESS (ICD-780.4) & HEARING LOSS (ICD-389.9) - see extensive eval by Rosine Beat 3/10 w/ Audiogram showing high freq loss that was similar to study in 7/09... Labs essent norm x increased catecholamines on 24H urine (?poss related to meds & off Imipramine now)... MRI Brain w/ small vessel disease otherw neg... CT Abd neg- normal adrenals bilat... he was given a course of oral PRED- not much change in hearing & f/u w/ DrKraus > prob Meniere's disease- tube placed in right ear 6/10, started on Aldactazide, uses Meclizine & Valium Prn... he did see DrLove 1/11 who agreed w/ the Dx.  OSA >> He had Sleep Study by [facility]; we don't have the results but he was placed on CPAP by [facility] & he  continues on CPAP & sees Hanna yearly...  HYPERTENSION (ICD-401.9) - controllled on meds per [facility]= METOPROLOL 50mg - 1.5 tabsBid, & ALDACTAZIDE 25-25 taken Bid... BP= 140/70 & he denies HA, visual symptoms, CP, palpit, syncope, etc... prev mild LE edema resolved w/ the diuretic... ~  2DEcho 7/09 by [facility] was normal... ~  2DEcho repeated by [facility] 4/12 but we don't have this report...  CORONARY ARTERY DISEASE (ICD-414.00) - on ASA 81mg /d + PLAVIX 75mg /d... known CAD followed by [facility]= he had both diagonal branch & RCA stents placed in 2004, and f/u cath Jan10 showed patent stents... ~  Myoview 11/09 by [facility] was negative... ~  Event recorder as part of his dizziness work up was reported no signif arrhythmias. ~  Myoview 3/12 showed neg- no ischemia or infarct, norm regional wall motion, EF= 73%... ~  Cath 4/12 by DrBerry  showed patent grafts & normal LV... ~  OV w/ DrBerry 8/12> stable OSA, HBP, CAD w/ PTCA/ stents, carotid dis; DrBerry checks him every 22mo...  CEREBROVASCULAR DISEASE (ICD-437.9) - hx of carotid disease followed by [facility] w/ CDopplers... ~  CDopplers w/ 60-79% right carotid stenosis... continues on ASA/ Plavix.  PERIPHERAL VASCULAR DISEASE (ICD-443.9) - known mild RAS followed by ultrasounds at the [facility] office... ~  Dopplers w/ 40% bilat RAS as noted but cath showed that renal arts looked good...  HYPERLIPIDEMIA (ICD-272.4) - on LIPITOR 80mg /d, ZETIA 10mg /d, FISH OIL 1000mg Bid... ~  FLP's followed by [facility] (we don't have recent numbers to review). ~  FLP 6/11 showed TChol 166, TG 425, HDL 32, LDL 78... may need fibrate- defer to [facility]. ~  Subsequent labs done by [facility]...  IRRITABLE BOWEL SYNDROME (ICD-564.1) & COLONIC POLYPS (ICD-211.3) - followed for GI by DrStark & last colonoscopy was 2/07 showing mult 3-56mm polyps= tubular adenomas and f/u plasnned 100yrs.  NEPHROLITHIASIS (ICD-592.0) - he is followed for Urology by DrGrapey and the pt tells me that he does his routine PSA screening as well- ?when this was done last? ~  labs here 6/11 showed PSA= 0.16  FIBROMYALGIA (ICD-729.1) - he has carried this diagnosis for yrs based on clinical symptoms and was treated w/ Imipramine 25mg  Bid until it was discontinued 3/10...  ANXIETY (ICD-300.00) - on DIAZEPAM 5mg - 1/2 to 1 tab by mouth three times a day Prn... he reports a prev reaction to Zoloft.  NOTE: Hx of ABNORMAL URINARY CATECHOL/METANEPHRINES - eval by DrKraus, DrEllison, DrLove> CT Abd was neg & MIBG scan was also neg- no adrenal lesions seen... poss related to Imipramine Rx  Health Maintenance - he had a TETANUS shot in 2003... he had PNEUMOVAX in 2007... he also received the SHINGLES vaccine in Oct08... he also takes MVI, Protegra, Vit C...   Past Surgical History  Procedure Date  . Tube right ear     Outpatient Encounter Prescriptions  as of 12/06/2011  Medication Sig Dispense Refill  . amLODipine (NORVASC) 2.5 MG tablet Take 2.5 mg by mouth daily.        . Ascorbic Acid (VITAMIN C) 1000 MG tablet Take 1,000 mg by mouth daily.       Marland Kitchen aspirin 81 MG tablet Take 81 mg by mouth daily.        Marland Kitchen atorvastatin (LIPITOR) 80 MG tablet Take 80 mg by mouth daily.        . clopidogrel (PLAVIX) 75 MG tablet Take 75 mg by mouth daily.        . diazepam (VALIUM) 5 MG tablet Take 1  tablet (5 mg total) by mouth every 8 (eight) hours as needed.  90 tablet  0  . ezetimibe (ZETIA) 10 MG tablet Take 10 mg by mouth daily.        . fish oil-omega-3 fatty acids 1000 MG capsule Take 1 g by mouth daily.       . lansoprazole (PREVACID) 15 MG capsule Take 15 mg by mouth daily.        . meclizine (ANTIVERT) 25 MG tablet Take one tablet by mouth every 8 hours as needed for dizziness  90 tablet  5  . metoprolol (LOPRESSOR) 50 MG tablet Take 1 1/2  Tablet by mouth two times daily.       . Multiple Vitamin (MULTIVITAMIN) capsule Take 1 capsule by mouth daily.        Marland Kitchen spironolactone-hydrochlorothiazide (ALDACTAZIDE) 25-25 MG per tablet Take one tablet by mouth two times daily  60 tablet  6    Allergies  Allergen Reactions  . Sertraline Hcl     REACTION: disorientation     Current Medications, Allergies, Past Medical History, Past Surgical History, Family History, and Social History were reviewed in [employer] record.    Review of Systems       See HPI - all other systems neg except as noted...  The patient complains of weight gain and dyspnea on exertion.  The patient denies anorexia, fever, weight loss, vision loss, decreased hearing, hoarseness, chest pain, syncope, peripheral edema, prolonged cough, headaches, hemoptysis, abdominal pain, melena, hematochezia, severe indigestion/heartburn, hematuria, incontinence, muscle weakness, suspicious skin lesions, transient blindness, difficulty walking, depression, unusual weight  change, abnormal bleeding, enlarged lymph nodes, and angioedema.     Objective:   Physical Exam    WD, sl obese, 76 y/o WM in NAD... he has gained 24# to 208#... GENERAL:  Alert & oriented; pleasant & cooperative... HEENT:  Nolan/AT, EOM-wnl, PERRLA, Fundi-benign, EACs-clear, TMs-wnl, NOSE-clear, THROAT-clear & wnl. NECK:  Supple w/ fairROM; no JVD; normal carotid impulses w/o bruits; no thyromegaly or nodules palpated; no lymphadenopathy. CHEST:  Clear to P & A; without wheezes/ rales/ or rhonchi. Bilat mild gynecomastia is present, no galactorrhea... HEART:  Regular Rhythm; without murmurs/ rubs/ or gallops. ABDOMEN:  Soft & nontender; normal bowel sounds; no organomegaly or masses detected. EXT: without deformities, mild arthritic changes; no varicose veins/ venous insuffic/ or edema. NEURO:  CN's intact; motor testing normal; sensory testing normal; gait normal & balance OK. DERM:  No lesions noted; no rash etc...  RADIOLOGY DATA:  Reviewed in the EPIC EMR & discussed w/ the patient...    >>CXR 4/12 showed normal heart size, clear lungs, NAD...  LABORATORY DATA:  Reviewed in the EPIC EMR & discussed w/ the patient...    >>LABS 3/13:  FLP- ok on Lip80&Zetia;  Chems- ok x BS=129 & needs low carb diet;  CBC- wnl;  TSH=1.63;  PSA=0.32;  BNP=45   Assessment & Plan:   OSA>  He was studied at [facility] & treated w/ CPAP per [facility], we do not have copies of the sleep data...  HBP>  Controlled on Metoprolol, Amolodipine & Aldactazide Bid; we discussed decreasing the aldactazide to one daily in the AM...  CAD>  Followed by Marland Mcalpine for [facility]; continue current meds, incr exercise program, f/u w/ Cards...  PAD & Cerebrovasc Dis>  On ASA/ Plavix- continue same & he will f/u w/ [facility] re Dopplers etc...  HYPERLIPIDEMIA>  On Lip80, Zetia10, FishOil & FLP was good x sl incr TG &  decr HDL; we discussed diet + exercise...  GI> GERD, IBS, Polyps>  On Prevacid 15mg /d; last colon was 2007 & f/u due in 2012>  he will contact DrStark regarding f/u colon...  GU>  He sees DrGrapey for f/u kidney stones and for his PSA screening...  FM> he has carried the ds of fibromyalgia in a male & some of his fatigue may be related...  Anxiety>  He requests refill of his Valium...   Patient's Medications  New Prescriptions   No medications on file  Previous Medications   AMLODIPINE (NORVASC) 2.5 MG TABLET    Take 2.5 mg by mouth daily.     ASCORBIC ACID (VITAMIN C) 1000 MG TABLET    Take 1,000 mg by mouth daily.    ASPIRIN 81 MG TABLET    Take 81 mg by mouth daily.     ATORVASTATIN (LIPITOR) 80 MG TABLET    Take 80 mg by mouth at bedtime.    CLOPIDOGREL (PLAVIX) 75 MG TABLET    Take 75 mg by mouth daily.     EZETIMIBE (ZETIA) 10 MG TABLET    Take 10 mg by mouth daily.     FISH OIL-OMEGA-3 FATTY ACIDS 1000 MG CAPSULE    Take 1,400 mg by mouth daily.    LANSOPRAZOLE (PREVACID) 15 MG CAPSULE    Take 15 mg by mouth daily.     MECLIZINE (ANTIVERT) 25 MG TABLET    Take one tablet by mouth every 8 hours as needed for dizziness   METOPROLOL (LOPRESSOR) 50 MG TABLET    Take 1 1/2  Tablet by mouth two times daily.    MULTIPLE VITAMIN (MULTIVITAMIN) CAPSULE    Take 1 capsule by mouth daily.    Modified Medications   Modified Medication Previous Medication   DIAZEPAM (VALIUM) 5 MG TABLET diazepam (VALIUM) 5 MG tablet      Take 1 tablet (5 mg total) by mouth every 8 (eight) hours as needed.    Take 1 tablet (5 mg total) by mouth every 8 (eight) hours as needed.   SPIRONOLACTONE-HYDROCHLOROTHIAZIDE (ALDACTAZIDE) 25-25 MG PER TABLET spironolactone-hydrochlorothiazide (ALDACTAZIDE) 25-25 MG per tablet      Take one tablet by mouth every morning    Take one tablet by mouth two times daily  Discontinued Medications   No medications on file

## 2011-12-06 NOTE — Patient Instructions (Signed)
Today we updated your med list in our EPIC system...    We decided to DECREASE your ALDACTAZIDE (Spironolactone/ HCT) to one tab in the AM only...    We refilled your DIAZEPAM 5mg  one tab up to 3 times daily as needed...  Please return to our lab in the AM for your follow up fasting blood work...    Then call the PHONE TREE in a few days for your results...    Dial [id number] & when prompted enter your patient number followed by the # symbol...    Your patient number is:   [id number]#  We will send copies of your LABS to DrBerry...  Let's get opn track w/ our diet & exercise program...    The goal is to lose 10-15 lbs...  Call for any questions...  Let's plan a routine follow up in 6 months.Marland KitchenMarland Kitchen

## 2011-12-07 ENCOUNTER — Other Ambulatory Visit (INDEPENDENT_AMBULATORY_CARE_PROVIDER_SITE_OTHER): Payer: Medicare Other

## 2011-12-07 DIAGNOSIS — D126 Benign neoplasm of colon, unspecified: Secondary | ICD-10-CM | POA: Diagnosis not present

## 2011-12-07 DIAGNOSIS — E785 Hyperlipidemia, unspecified: Secondary | ICD-10-CM

## 2011-12-07 DIAGNOSIS — N139 Obstructive and reflux uropathy, unspecified: Secondary | ICD-10-CM | POA: Diagnosis not present

## 2011-12-07 DIAGNOSIS — I10 Essential (primary) hypertension: Secondary | ICD-10-CM | POA: Diagnosis not present

## 2011-12-07 DIAGNOSIS — F411 Generalized anxiety disorder: Secondary | ICD-10-CM | POA: Diagnosis not present

## 2011-12-07 DIAGNOSIS — R0609 Other forms of dyspnea: Secondary | ICD-10-CM

## 2011-12-07 DIAGNOSIS — R0600 Dyspnea, unspecified: Secondary | ICD-10-CM

## 2011-12-07 DIAGNOSIS — R0989 Other specified symptoms and signs involving the circulatory and respiratory systems: Secondary | ICD-10-CM | POA: Diagnosis not present

## 2011-12-07 LAB — CBC WITH DIFFERENTIAL/PLATELET
Basophils Absolute: 0.1 10*3/uL (ref 0.0–0.1)
Eosinophils Absolute: 0.5 10*3/uL (ref 0.0–0.7)
HCT: 42.5 % (ref 39.0–52.0)
Hemoglobin: 14.3 g/dL (ref 13.0–17.0)
Lymphs Abs: 3 10*3/uL (ref 0.7–4.0)
MCHC: 33.6 g/dL (ref 30.0–36.0)
MCV: 94.9 fl (ref 78.0–100.0)
Neutro Abs: 4.4 10*3/uL (ref 1.4–7.7)
RDW: 12.6 % (ref 11.5–14.6)

## 2011-12-07 LAB — BASIC METABOLIC PANEL
CO2: 31 mEq/L (ref 19–32)
Calcium: 9.1 mg/dL (ref 8.4–10.5)
GFR: 72.73 mL/min (ref >60.00)
Sodium: 139 mEq/L (ref 135–145)

## 2011-12-07 LAB — LIPID PANEL
HDL: 36.5 mg/dL — ABNORMAL LOW (ref >39.00)
Total CHOL/HDL Ratio: 3
VLDL: 30.6 mg/dL (ref 0.0–40.0)

## 2011-12-07 LAB — HEPATIC FUNCTION PANEL
Alkaline Phosphatase: 78 U/L (ref 39–117)
Bilirubin, Direct: 0.2 mg/dL (ref 0.0–0.3)

## 2011-12-18 ENCOUNTER — Encounter: Payer: Self-pay | Admitting: Gastroenterology

## 2011-12-18 ENCOUNTER — Other Ambulatory Visit: Payer: Self-pay | Admitting: Pulmonary Disease

## 2011-12-18 DIAGNOSIS — G4733 Obstructive sleep apnea (adult) (pediatric): Secondary | ICD-10-CM | POA: Diagnosis not present

## 2011-12-18 DIAGNOSIS — I10 Essential (primary) hypertension: Secondary | ICD-10-CM | POA: Diagnosis not present

## 2011-12-18 DIAGNOSIS — K589 Irritable bowel syndrome without diarrhea: Secondary | ICD-10-CM

## 2011-12-18 DIAGNOSIS — D126 Benign neoplasm of colon, unspecified: Secondary | ICD-10-CM

## 2011-12-18 DIAGNOSIS — E782 Mixed hyperlipidemia: Secondary | ICD-10-CM | POA: Diagnosis not present

## 2011-12-31 ENCOUNTER — Other Ambulatory Visit: Payer: Self-pay | Admitting: Pulmonary Disease

## 2012-01-15 ENCOUNTER — Ambulatory Visit: Payer: Medicare Other | Admitting: Gastroenterology

## 2012-02-22 ENCOUNTER — Other Ambulatory Visit: Payer: Self-pay | Admitting: Pulmonary Disease

## 2012-05-13 DIAGNOSIS — I6529 Occlusion and stenosis of unspecified carotid artery: Secondary | ICD-10-CM | POA: Diagnosis not present

## 2012-05-13 DIAGNOSIS — I701 Atherosclerosis of renal artery: Secondary | ICD-10-CM | POA: Diagnosis not present

## 2012-05-13 DIAGNOSIS — I10 Essential (primary) hypertension: Secondary | ICD-10-CM | POA: Diagnosis not present

## 2012-06-04 DIAGNOSIS — L821 Other seborrheic keratosis: Secondary | ICD-10-CM | POA: Diagnosis not present

## 2012-06-04 DIAGNOSIS — L570 Actinic keratosis: Secondary | ICD-10-CM | POA: Diagnosis not present

## 2012-06-17 ENCOUNTER — Encounter: Payer: Self-pay | Admitting: Gastroenterology

## 2012-06-18 DIAGNOSIS — Z23 Encounter for immunization: Secondary | ICD-10-CM | POA: Diagnosis not present

## 2012-07-09 ENCOUNTER — Other Ambulatory Visit: Payer: Self-pay | Admitting: Pulmonary Disease

## 2012-07-11 ENCOUNTER — Telehealth: Payer: Self-pay | Admitting: Pulmonary Disease

## 2012-07-11 MED ORDER — DIAZEPAM 5 MG PO TABS: 5.0000 mg | ORAL_TABLET | Freq: Three times a day (TID) | ORAL | Status: DC | PRN

## 2012-07-11 NOTE — Telephone Encounter (Signed)
I have called refill in  Refill in. Pt was due for follow up 06/2012. No OV made. I called pt to make him aware needs OV for further refills. He stated will have his wife call back for appt.\ Nothing further was needed

## 2012-07-15 ENCOUNTER — Ambulatory Visit (INDEPENDENT_AMBULATORY_CARE_PROVIDER_SITE_OTHER): Payer: Medicare Other | Admitting: Pulmonary Disease

## 2012-07-15 ENCOUNTER — Other Ambulatory Visit (INDEPENDENT_AMBULATORY_CARE_PROVIDER_SITE_OTHER): Payer: Medicare Other

## 2012-07-15 ENCOUNTER — Encounter: Payer: Self-pay | Admitting: *Deleted

## 2012-07-15 ENCOUNTER — Encounter: Payer: Self-pay | Admitting: Pulmonary Disease

## 2012-07-15 VITALS — BP 132/74 | HR 65 | Temp 98.9°F | Ht 68.0 in | Wt 212.0 lb

## 2012-07-15 DIAGNOSIS — R7301 Impaired fasting glucose: Secondary | ICD-10-CM | POA: Diagnosis not present

## 2012-07-15 DIAGNOSIS — I679 Cerebrovascular disease, unspecified: Secondary | ICD-10-CM

## 2012-07-15 DIAGNOSIS — I10 Essential (primary) hypertension: Secondary | ICD-10-CM

## 2012-07-15 DIAGNOSIS — F411 Generalized anxiety disorder: Secondary | ICD-10-CM

## 2012-07-15 DIAGNOSIS — K589 Irritable bowel syndrome without diarrhea: Secondary | ICD-10-CM

## 2012-07-15 DIAGNOSIS — I2510 Atherosclerotic heart disease of native coronary artery without angina pectoris: Secondary | ICD-10-CM

## 2012-07-15 DIAGNOSIS — G4733 Obstructive sleep apnea (adult) (pediatric): Secondary | ICD-10-CM | POA: Diagnosis not present

## 2012-07-15 DIAGNOSIS — N200 Calculus of kidney: Secondary | ICD-10-CM

## 2012-07-15 DIAGNOSIS — IMO0001 Reserved for inherently not codable concepts without codable children: Secondary | ICD-10-CM

## 2012-07-15 DIAGNOSIS — E785 Hyperlipidemia, unspecified: Secondary | ICD-10-CM

## 2012-07-15 DIAGNOSIS — I739 Peripheral vascular disease, unspecified: Secondary | ICD-10-CM

## 2012-07-15 LAB — BASIC METABOLIC PANEL
CO2: 31 mEq/L (ref 19–32)
Chloride: 102 mEq/L (ref 96–112)
GFR: 69.57 mL/min (ref >60.00)
Glucose, Bld: 105 mg/dL — ABNORMAL HIGH (ref 70–99)
Potassium: 4.6 mEq/L (ref 3.5–5.1)
Sodium: 141 mEq/L (ref 135–145)

## 2012-07-15 NOTE — Patient Instructions (Addendum)
Today we updated your med list in our EPIC system...    Continue your current medications the same...  Today we did your follow up metabolic panel...    We will call you w/ the results...  Let's get on track w/ our diet & exercise program...    The goal is to lose 10-15 lbs...  Call for any problems...  Let's plan a follow up visit in 6 months w/ FASTING blood work at that time.Marland KitchenMarland Kitchen

## 2012-07-15 NOTE — Progress Notes (Signed)
Subjective:    Patient ID: Eric [name], male    DOB: 06/28/1936, 76 y.o.   MRN: [id number]  HPI 76 y/o WM here for a follow up visit... he has multiple medical problems as noted below...    ~  March 23, 2010:  CPX> doing well, no complaints; his Meniere's is controlled but Aldactazide caused some breast tissue enlargement, and I offered to switch it to HCT but he declined the change; weight is up to 207# & we reviewed diet + exercise; Labs showed elv TG's & he is reminded to stay on a low fat diet...  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 He would like me to refill his Aldactazide, Meclizine, & Valium today> his renal function & K+ are OK... See prob list below>>  ~  December 06, 2011:  42mo ROV & Eric [name] reports doing well overall, no new complaonts or concerns, wants his Valium refilled...    OSA> on CPAP per DrKelly, [facility]; we don't have Sleep study data; states he's doing satis on mask/ CPAP; he follows w/ DrKelly once per yr...    HBP> on Meptop75Bid, Amlodip2.5, Aldactazide 25-25 Bid he says; BP=140/78 & he denies CP, palpit, syncope, SOB, edema, etc...    CAD> on ASA/ Plavix, plus other meds listed; doing satis w/o angina etc; he follow up w/ DrBerry every 38mo- last seen 9/12 & has f/u soon...    Cerebrovasc Dis> on ASA/ Plavix; he denies cerebral ischemic symptoms; CDopplers per [facility]- ?when last & our records indicate 60-79% RICA stenosis w/ bruit.    Hyperlipid> on Lip80, Zetia10, Fish Oil; FLP today showed TChol 125, TG 153, HDL 37, LDL 58; rec better low fat diet & incr  exercise.    GI> IBS, Polyps> on Prev15; he denies abd pain, n/v/d/c or blood in stools; last colon 2/07 & he is due for f/u DrStark...    Hx KidStones> he is followed by DrGrapey for Urology; no recent problems...    Anxiety> on Valium5mg  tid prn; he requests refill... LABS 3/13:  FLP- ok on Lip80&Zetia;  Chems- ok x BS=129 & needs low carb diet;  CBC- wnl;  TSH=1.63;  PSA=0.32;  BNP=45  ~  July 15, 2012:  42mo ROV & Eric [name] is stable but has persist chest discomfort w/ activity, stress, etc; DrBerry is his Cardiologist & he is aware w/ prev work ups etc...     Eric [name] continues on CPAP nightly by [facility], resting well, wakes refreshed, no issues w/ daytime hypersomnolence;  BP controlled on Metop & Aldactazide;  CAD followed by DrBerry on ASA/ Plavix;  Known cerebrovasc dis on ASA/ Plavis & CDopplers followed by DrBerry as well;  Chol treated w/ Lip80 + Zetia & last FLP done by [facility] per pt but we don't have recent notes;  He remains quite anxious on Valium as needed...    We reviewed prob list, meds, xrays and labs> see below for updates >> he had the  2013 Flu vaccine in Sept... LABS 10/13:  Chems- ok w/ BS=105 A1c=6.8   PROBLEM LIST:    DIZZINESS (ICD-780.4) & HEARING LOSS (ICD-389.9) - see extensive eval by Rosine Beat 3/10 w/ Audiogram showing high freq loss that was similar to study in 7/09... Labs essent norm x increased catecholamines on 24H urine (?poss related to meds & off Imipramine now)... MRI Brain w/ small vessel disease otherw neg... CT Abd neg- normal adrenals bilat... he was given a course of oral PRED- not much change in hearing & f/u w/ DrKraus > prob Meniere's disease- tube placed in right ear 6/10, started on Aldactazide, uses Meclizine & Valium Prn... he did see DrLove 1/11 who agreed w/ the Dx.  OSA >> He had Sleep Study by [facility]; we don't have the results but he was placed on CPAP by [facility] & he continues on CPAP & sees Troy yearly...  HYPERTENSION (ICD-401.9) - controllled on  meds per [facility]= METOPROLOL 50mg - 1.5 tabsBid, & ALDACTAZIDE 25-25 taken Bid... ~  2DEcho 7/09 by [facility] was normal... ~  CXR 4/12 showed normal heart size, clear lungs, NAD... ~  2DEcho repeated by [facility] 4/12 but we don't have this report... ~  3/13:  BP= 140/78 & he denies HA, visual symptoms, CP, palpit, syncope, etc... prev mild LE edema resolved w/ the diuretic... ~  10/13:  BP= 132/74 & he denies palpit, ch in SOB, edema, etc...  CORONARY ARTERY DISEASE (ICD-414.00) - on ASA 81mg /d + PLAVIX 75mg /d... known CAD followed by [facility]= he had both diagonal branch & RCA stents placed in 2004, and f/u cath Jan10 showed patent stents... ~  Myoview 11/09 by [facility] was negative... ~  Event recorder as part of his dizziness work up was reported no signif arrhythmias. ~  Myoview 3/12 showed neg- no ischemia or infarct, norm regional wall motion, EF= 73%... ~  Cath 4/12 by DrBerry showed patent grafts & normal LV... ~  OV w/ DrBerry 8/12> stable OSA, HBP, CAD w/ PTCA/ stents, carotid dis; DrBerry checks him every 10mo... ~  3/13:  OV note from DrBerry reviewed> stable, no changes made...  CEREBROVASCULAR DISEASE (ICD-437.9) - hx of carotid disease followed by [facility] w/ CDopplers... ~  CDopplers w/ 60-79% right carotid stenosis... continues on ASA/ Plavix.  PERIPHERAL VASCULAR DISEASE (ICD-443.9) - known mild RAS followed by ultrasounds at the [facility] office... ~  Dopplers w/ 40% bilat RAS as noted but cath showed that renal arts looked good...  HYPERLIPIDEMIA (ICD-272.4) - on LIPITOR 80mg /d, ZETIA 10mg /d, FISH OIL 1000mg Bid... ~  FLP's followed by [facility] (we don't have recent numbers to review). ~  FLP 6/11 showed TChol 166, TG 425, HDL 32, LDL 78... may need fibrate- defer to [facility]. ~  Subsequent labs done by [facility]... ~  FLP here 3/13 on Lip80+Zetia10 showed TChol 125, TG 153, HDL 37, LDL 58  IRRITABLE BOWEL SYNDROME (ICD-564.1) & COLONIC POLYPS (ICD-211.3) - followed for GI by DrStark & last colonoscopy was 2/07  showing mult 3-80mm polyps= tubular adenomas and f/u plasnned 34yrs.  NEPHROLITHIASIS (ICD-592.0) - he is followed for Urology by DrGrapey and the pt tells me that he does his routine PSA screening as well- ?when this was done last? ~  labs here 6/11 showed PSA= 0.16  FIBROMYALGIA (ICD-729.1) - he has carried this diagnosis for yrs based on clinical symptoms and was treated w/ Imipramine 25mg  Bid until it was discontinued 3/10...  ANXIETY (ICD-300.00) - on DIAZEPAM 5mg - 1/2 to 1 tab by mouth three times  a day Prn... he reports a prev reaction to Zoloft.  NOTE: Hx of ABNORMAL URINARY CATECHOL/METANEPHRINES - eval by ?DrKraus, DrEllison, DrLove> CT Abd was neg & MIBG scan was also neg- no adrenal lesions seen... poss related to Imipramine Rx  Health Maintenance - he had a TETANUS shot in 2003... he had PNEUMOVAX in 2007... he also received the SHINGLES vaccine in Oct08... he also takes MVI, Protegra, Vit C...   Past Surgical History  Procedure Date  . Tube right ear     Outpatient Encounter Prescriptions as of 07/15/2012  Medication Sig Dispense Refill  . amLODipine (NORVASC) 2.5 MG tablet Take 2.5 mg by mouth daily.        . Ascorbic Acid (VITAMIN C) 1000 MG tablet Take 1,000 mg by mouth daily.       Eric Kitchen aspirin 81 MG tablet Take 81 mg by mouth daily.        Eric Kitchen atorvastatin (LIPITOR) 80 MG tablet Take 80 mg by mouth at bedtime.       . clopidogrel (PLAVIX) 75 MG tablet Take 75 mg by mouth daily.        . diazepam (VALIUM) 5 MG tablet Take 1 tablet (5 mg total) by mouth every 8 (eight) hours as needed (NEEDS ov FOR FURTHER REFILLS).  90 tablet  0  . ezetimibe (ZETIA) 10 MG tablet Take 10 mg by mouth daily.        . fish oil-omega-3 fatty acids 1000 MG capsule Take 1,400 mg by mouth daily.       . lansoprazole (PREVACID) 15 MG capsule Take 15 mg by mouth daily.        . meclizine (ANTIVERT) 25 MG tablet TAKE ONE TABLET BY MOUTH EVERY 8 HOURS AS NEEDED FOR DIZZINESS  90 tablet  5  . metoprolol  (LOPRESSOR) 50 MG tablet Take 1 1/2  Tablet by mouth two times daily.       . Multiple Vitamin (MULTIVITAMIN) capsule Take 1 capsule by mouth daily.        Eric Kitchen spironolactone-hydrochlorothiazide (ALDACTAZIDE) 25-25 MG per tablet Take one tablet by mouth every morning  90 tablet  3  . DISCONTD: spironolactone-hydrochlorothiazide (ALDACTAZIDE) 25-25 MG per tablet TAKE ONE TABLET BY MOUTH TWO TIMES DAILY  60 tablet  6    Allergies  Allergen Reactions  . Sertraline Hcl     REACTION: disorientation     Current Medications, Allergies, Past Medical History, Past Surgical History, Family History, and Social History were reviewed in [employer] record.    Review of Systems       See HPI - all other systems neg except as noted...  The patient complains of weight gain and dyspnea on exertion.  The patient denies anorexia, fever, weight loss, vision loss, decreased hearing, hoarseness, chest pain, syncope, peripheral edema, prolonged cough, headaches, hemoptysis, abdominal pain, melena, hematochezia, severe indigestion/heartburn, hematuria, incontinence, muscle weakness, suspicious skin lesions, transient blindness, difficulty walking, depression, unusual weight change, abnormal bleeding, enlarged lymph nodes, and angioedema.     Objective:   Physical Exam    WD, sl obese, 76 y/o WM in NAD... he has gained 24# to 208#... GENERAL:  Alert & oriented; pleasant & cooperative... HEENT:  [place]/AT, EOM-wnl, PERRLA, [place]i-benign, EACs-clear, TMs-wnl, NOSE-clear, THROAT-clear & wnl. NECK:  Supple w/ fairROM; no JVD; normal carotid impulses w/o bruits; no thyromegaly or nodules palpated; no lymphadenopathy. CHEST:  Clear to P & A; without wheezes/ rales/ or rhonchi. Bilat mild gynecomastia is present,  no galactorrhea... HEART:  Regular Rhythm; without murmurs/ rubs/ or gallops. ABDOMEN:  Soft & nontender; normal bowel sounds; no organomegaly or masses detected. EXT: without deformities,  mild arthritic changes; no varicose veins/ venous insuffic/ or edema. NEURO:  CN's intact; motor testing normal; sensory testing normal; gait normal & balance OK. DERM:  No lesions noted; no rash etc...  RADIOLOGY DATA:  Reviewed in the EPIC EMR & discussed w/ the patient...    >>CXR 4/12 showed normal heart size, clear lungs, NAD...  LABORATORY DATA:  Reviewed in the EPIC EMR & discussed w/ the patient...    >>LABS 3/13:  FLP- ok on Lip80&Zetia;  Chems- ok x BS=129 & needs low carb diet;  CBC- wnl;  TSH=1.63;  PSA=0.32;  BNP=45   Assessment & Plan:    OSA>  He was studied at [facility] & treated w/ CPAP per [facility], we do not have copies of the sleep data...  HBP>  Controlled on Metoprolol & Aldactazide; continue same meds...  CAD>  Followed by Eric [name] for [facility]; continue current meds, incr exercise program, f/u w/ Cards...  PAD & Cerebrovasc Dis>  On ASA/ Plavix- continue same & he will f/u w/ [facility] re Dopplers etc...  HYPERLIPIDEMIA>  On Lip80, Zetia10, FishOil & FLP was good x sl incr TG & decr HDL; we discussed diet + exercise...  GI> GERD, IBS, Polyps>  On Prevacid 15mg /d; last colon was 2007 & f/u due in 2012> he will contact DrStark regarding f/u colon...  GU>  He sees DrGrapey for f/u kidney stones and for his PSA screening...  FM> he has carried the ds of fibromyalgia in a male & some of his fatigue may be related...  Anxiety>  He requests refill of his Valium...    Patient's Medications  New Prescriptions   No medications on file  Previous Medications   AMLODIPINE (NORVASC) 2.5 MG TABLET    Take 2.5 mg by mouth daily.     ASCORBIC ACID (VITAMIN C) 1000 MG TABLET    Take 1,000 mg by mouth daily.    ASPIRIN 81 MG TABLET    Take 81 mg by mouth daily.     ATORVASTATIN (LIPITOR) 80 MG TABLET    Take 80 mg by mouth at bedtime.    CLOPIDOGREL (PLAVIX) 75 MG TABLET    Take 75 mg by mouth daily.     DIAZEPAM (VALIUM) 5 MG TABLET    Take 1 tablet (5 mg total) by mouth every 8  (eight) hours as needed (NEEDS ov FOR FURTHER REFILLS).   EZETIMIBE (ZETIA) 10 MG TABLET    Take 10 mg by mouth daily.     FISH OIL-OMEGA-3 FATTY ACIDS 1000 MG CAPSULE    Take 1,400 mg by mouth daily.    LANSOPRAZOLE (PREVACID) 15 MG CAPSULE    Take 15 mg by mouth daily.     MECLIZINE (ANTIVERT) 25 MG TABLET    TAKE ONE TABLET BY MOUTH EVERY 8 HOURS AS NEEDED FOR DIZZINESS   METOPROLOL (LOPRESSOR) 50 MG TABLET    Take 1 1/2  Tablet by mouth two times daily.    MULTIPLE VITAMIN (MULTIVITAMIN) CAPSULE    Take 1 capsule by mouth daily.     SPIRONOLACTONE-HYDROCHLOROTHIAZIDE (ALDACTAZIDE) 25-25 MG PER TABLET    Take one tablet by mouth every morning  Modified Medications   No medications on file  Discontinued Medications   SPIRONOLACTONE-HYDROCHLOROTHIAZIDE (ALDACTAZIDE) 25-25 MG PER TABLET    TAKE ONE TABLET BY MOUTH TWO TIMES  DAILY

## 2012-07-30 DIAGNOSIS — H2510 Age-related nuclear cataract, unspecified eye: Secondary | ICD-10-CM | POA: Diagnosis not present

## 2012-07-30 DIAGNOSIS — H524 Presbyopia: Secondary | ICD-10-CM | POA: Diagnosis not present

## 2012-08-12 DIAGNOSIS — E782 Mixed hyperlipidemia: Secondary | ICD-10-CM | POA: Diagnosis not present

## 2012-08-12 DIAGNOSIS — I10 Essential (primary) hypertension: Secondary | ICD-10-CM | POA: Diagnosis not present

## 2012-08-12 DIAGNOSIS — I2510 Atherosclerotic heart disease of native coronary artery without angina pectoris: Secondary | ICD-10-CM | POA: Diagnosis not present

## 2012-09-10 ENCOUNTER — Other Ambulatory Visit: Payer: Self-pay | Admitting: Pulmonary Disease

## 2012-11-05 ENCOUNTER — Other Ambulatory Visit: Payer: Self-pay | Admitting: Pulmonary Disease

## 2012-12-12 ENCOUNTER — Telehealth: Payer: Self-pay | Admitting: Pulmonary Disease

## 2012-12-12 MED ORDER — AMOXICILLIN-POT CLAVULANATE 875-125 MG PO TABS: 1.0000 | ORAL_TABLET | Freq: Two times a day (BID) | ORAL | Status: DC

## 2012-12-12 MED ORDER — HYDROCODONE-HOMATROPINE 5-1.5 MG/5ML PO SYRP: 5.0000 mL | ORAL_SOLUTION | Freq: Four times a day (QID) | ORAL | Status: DC | PRN

## 2012-12-12 NOTE — Telephone Encounter (Signed)
Called and spoke with pts wife and she stated that he has sore throat, cough with yellow sputum since Tuesday.  Body aches across his chest but no fever.  Feels that this is from all the coughing that he is doing.  Pt is wanting to have something called in for him and i have scheduled an appt with TP for the pt on Monday at 12.  SN please advise. Thanks   Allergies  Allergen Reactions  . Sertraline Hcl     REACTION: disorientation

## 2012-12-12 NOTE — Telephone Encounter (Signed)
Called and spoke with pts wife and she is aware of SN recs for the augmentin and the hycodan that have been called to the pharmacy.  pts wife is aware and she will keep the appt with TP for now but if the pt is doing better she will cancel this on Monday morning. Nothing further is needed.

## 2012-12-16 ENCOUNTER — Ambulatory Visit: Payer: Medicare Other | Admitting: Adult Health

## 2012-12-30 ENCOUNTER — Other Ambulatory Visit: Payer: Self-pay | Admitting: Pulmonary Disease

## 2013-01-20 ENCOUNTER — Other Ambulatory Visit: Payer: Self-pay | Admitting: Pulmonary Disease

## 2013-01-22 DIAGNOSIS — I70219 Atherosclerosis of native arteries of extremities with intermittent claudication, unspecified extremity: Secondary | ICD-10-CM | POA: Diagnosis not present

## 2013-01-22 DIAGNOSIS — R609 Edema, unspecified: Secondary | ICD-10-CM | POA: Diagnosis not present

## 2013-01-22 DIAGNOSIS — R0602 Shortness of breath: Secondary | ICD-10-CM | POA: Diagnosis not present

## 2013-01-22 DIAGNOSIS — I2510 Atherosclerotic heart disease of native coronary artery without angina pectoris: Secondary | ICD-10-CM | POA: Diagnosis not present

## 2013-01-22 DIAGNOSIS — I10 Essential (primary) hypertension: Secondary | ICD-10-CM | POA: Diagnosis not present

## 2013-01-23 ENCOUNTER — Other Ambulatory Visit (HOSPITAL_COMMUNITY): Payer: Self-pay | Admitting: Physician Assistant

## 2013-01-23 DIAGNOSIS — I70219 Atherosclerosis of native arteries of extremities with intermittent claudication, unspecified extremity: Secondary | ICD-10-CM

## 2013-01-23 DIAGNOSIS — R609 Edema, unspecified: Secondary | ICD-10-CM

## 2013-02-07 ENCOUNTER — Ambulatory Visit (HOSPITAL_COMMUNITY)
Admission: RE | Admit: 2013-02-07 | Discharge: 2013-02-07 | Disposition: A | Discharge status: Home / Self Care | Payer: Medicare Other | Source: Ambulatory Visit | Attending: Cardiovascular Disease | Admitting: Cardiovascular Disease

## 2013-02-07 DIAGNOSIS — R609 Edema, unspecified: Secondary | ICD-10-CM | POA: Insufficient documentation

## 2013-02-07 DIAGNOSIS — E669 Obesity, unspecified: Secondary | ICD-10-CM | POA: Diagnosis not present

## 2013-02-07 DIAGNOSIS — G4733 Obstructive sleep apnea (adult) (pediatric): Secondary | ICD-10-CM | POA: Diagnosis not present

## 2013-02-07 DIAGNOSIS — Z87891 Personal history of nicotine dependence: Secondary | ICD-10-CM | POA: Insufficient documentation

## 2013-02-07 DIAGNOSIS — E785 Hyperlipidemia, unspecified: Secondary | ICD-10-CM | POA: Diagnosis not present

## 2013-02-07 DIAGNOSIS — I70219 Atherosclerosis of native arteries of extremities with intermittent claudication, unspecified extremity: Secondary | ICD-10-CM

## 2013-02-07 DIAGNOSIS — I2510 Atherosclerotic heart disease of native coronary artery without angina pectoris: Secondary | ICD-10-CM | POA: Diagnosis not present

## 2013-02-07 DIAGNOSIS — I10 Essential (primary) hypertension: Secondary | ICD-10-CM | POA: Diagnosis not present

## 2013-02-07 DIAGNOSIS — I739 Peripheral vascular disease, unspecified: Secondary | ICD-10-CM

## 2013-02-07 NOTE — Progress Notes (Signed)
[facility]   2D echo completed 02/07/2013.   Cindy Jolyssa Oplinger, RDCS  

## 2013-02-07 NOTE — Progress Notes (Signed)
Arterial Lower Ext. Duplex completed. Chauncy Lean

## 2013-02-10 DIAGNOSIS — I10 Essential (primary) hypertension: Secondary | ICD-10-CM | POA: Diagnosis not present

## 2013-02-10 DIAGNOSIS — R609 Edema, unspecified: Secondary | ICD-10-CM | POA: Diagnosis not present

## 2013-02-10 DIAGNOSIS — I2510 Atherosclerotic heart disease of native coronary artery without angina pectoris: Secondary | ICD-10-CM | POA: Diagnosis not present

## 2013-02-10 DIAGNOSIS — I359 Nonrheumatic aortic valve disorder, unspecified: Secondary | ICD-10-CM | POA: Diagnosis not present

## 2013-02-12 ENCOUNTER — Other Ambulatory Visit: Payer: Self-pay | Admitting: Cardiovascular Disease

## 2013-02-12 DIAGNOSIS — Z79899 Other long term (current) drug therapy: Secondary | ICD-10-CM | POA: Diagnosis not present

## 2013-02-12 LAB — BASIC METABOLIC PANEL
BUN: 19 mg/dL (ref 6–23)
CO2: 31 mEq/L (ref 19–32)
Glucose, Bld: 268 mg/dL — ABNORMAL HIGH (ref 70–99)
Potassium: 4.3 mEq/L (ref 3.5–5.3)

## 2013-02-14 ENCOUNTER — Encounter: Payer: Self-pay | Admitting: *Deleted

## 2013-02-14 ENCOUNTER — Telehealth: Payer: Self-pay | Admitting: Cardiovascular Disease

## 2013-02-14 NOTE — Telephone Encounter (Signed)
Spoke with ms Molloy and reviewed test results

## 2013-02-14 NOTE — Telephone Encounter (Signed)
returning call from yesterday-concerning lab results!

## 2013-02-17 ENCOUNTER — Encounter: Payer: Self-pay | Admitting: Cardiovascular Disease

## 2013-02-17 ENCOUNTER — Ambulatory Visit (INDEPENDENT_AMBULATORY_CARE_PROVIDER_SITE_OTHER): Payer: Medicare Other | Admitting: Cardiovascular Disease

## 2013-02-17 VITALS — BP 150/90 | HR 72 | Ht 68.0 in | Wt 214.0 lb

## 2013-02-17 DIAGNOSIS — Z79899 Other long term (current) drug therapy: Secondary | ICD-10-CM

## 2013-02-17 DIAGNOSIS — I679 Cerebrovascular disease, unspecified: Secondary | ICD-10-CM | POA: Diagnosis not present

## 2013-02-17 DIAGNOSIS — R600 Localized edema: Secondary | ICD-10-CM | POA: Insufficient documentation

## 2013-02-17 DIAGNOSIS — I2510 Atherosclerotic heart disease of native coronary artery without angina pectoris: Secondary | ICD-10-CM

## 2013-02-17 MED ORDER — LOSARTAN POTASSIUM 50 MG PO TABS: 50.0000 mg | ORAL_TABLET | Freq: Every day | ORAL | Status: DC

## 2013-02-17 NOTE — Progress Notes (Signed)
02/17/2013 Teresita Madura   Mar 16, 1936  [id number]  Primary Physician Romero Belling, MD Primary Cardiologist: Runell Gess MD [name]   HPI:  The patient is a 77 year old overweight Caucasian male who is a patient of Dr. Allyson Sabal. He has a history of coronary artery disease and PVOD. He had a stent to the LAD and diagonal branch back on July 14, 2003, with subsequent stenting by Dr. Elsie Lincoln July 31, 2003. He had mild bilateral renal artery stenosis at catheterization and blockages in the 40% range as well as moderate right internal carotid artery stenosis by duplex ultrasound. He also has hypertension, hyperlipidemia, and obstructive sleep apnea which is followed by Dr. Tresa Endo. He was last seen August 12, 2012.   He was seen in the office for 2314 by Huey Bienenstock [facility] as well as 02/10/2013 biolytic [place] because of large edema. A 2-D echo was ordered that showed normal systolic function with no evidence of diastolic dysfunction. Mild aortic stenosis and mitral stenosis unchanged with prior echo. Lower extremity Dopplers are normal as well.his diuretics were adjusted which has resulted in some improvement in his edema. A BNP.was normal.      Current Outpatient Prescriptions  Medication Sig Dispense Refill  . aspirin 81 MG tablet Take 81 mg by mouth daily.        Marland Kitchen atorvastatin (LIPITOR) 80 MG tablet Take 80 mg by mouth at bedtime.       . clopidogrel (PLAVIX) 75 MG tablet Take 75 mg by mouth daily.        . diazepam (VALIUM) 5 MG tablet TAKE 1 TABLET EVERY 8 HOURS AS NEEDED  90 tablet  1  . ezetimibe (ZETIA) 10 MG tablet Take 10 mg by mouth daily.        . furosemide (LASIX) 20 MG tablet Take 20 mg by mouth daily.      . lansoprazole (PREVACID) 15 MG capsule Take 15 mg by mouth daily.        . meclizine (ANTIVERT) 25 MG tablet TAKE ONE TABLET BY MOUTH EVERY 8 HOURS AS NEEDED FOR DIZZINESS  90 tablet  5  . metoprolol (LOPRESSOR) 50 MG tablet Take 1 1/2  Tablet by mouth two  times daily.       Marland Kitchen amoxicillin-clavulanate (AUGMENTIN) 875-125 MG per tablet Take 1 tablet by mouth 2 (two) times daily.  14 tablet  0  . Ascorbic Acid (VITAMIN C) 1000 MG tablet Take 1,000 mg by mouth daily.       . fish oil-omega-3 fatty acids 1000 MG capsule Take 1,400 mg by mouth daily.       Marland Kitchen HYDROcodone-homatropine (HYCODAN) 5-1.5 MG/5ML syrup Take 5 mLs by mouth every 6 (six) hours as needed for cough.  240 mL  1  . losartan (COZAAR) 50 MG tablet Take 1 tablet (50 mg total) by mouth daily.  90 tablet  3  . Multiple Vitamin (MULTIVITAMIN) capsule Take 1 capsule by mouth daily.         No current facility-administered medications for this visit.    Allergies  Allergen Reactions  . Niaspan (Niacin Er)   . Sertraline Hcl     REACTION: disorientation  . Tetanus Toxoids     History   Social History  . Marital Status: Married    Spouse Name: ruth    Number of Children: 4  . Years of Education: N/A   Occupational History  . Retired    Social History Main Topics  . Smoking  status: Former Smoker -- 1.00 packs/day for 15 years    Types: Cigarettes    Quit date: 10/02/1964  . Smokeless tobacco: Never Used     Comment: 1 ppd x 15 years  . Alcohol Use: No  . Drug Use: No  . Sexually Active: Not on file   Other Topics Concern  . Not on file   Social History Narrative  . No narrative on file     Review of Systems: General: negative for chills, fever, night sweats or weight changes.  Cardiovascular: negative for chest pain, dyspnea on exertion, edema, orthopnea, palpitations, paroxysmal nocturnal dyspnea or shortness of breath Dermatological: negative for rash Respiratory: negative for cough or wheezing Urologic: negative for hematuria Abdominal: negative for nausea, vomiting, diarrhea, bright red blood per rectum, melena, or hematemesis Neurologic: negative for visual changes, syncope, or dizziness All other systems reviewed and are otherwise negative except as noted  above.    Blood pressure 150/90, pulse 72, height 5\' 8"  (1.727 m), weight 214 lb (97.07 kg).  General appearance: alert and no distress Neck: no adenopathy, no JVD, supple, symmetrical, trachea midline, thyroid not enlarged, symmetric, no tenderness/mass/nodules and bilateral carotid bruits right greater than left Lungs: clear to auscultation bilaterally Heart: systolic murmur: systolic ejection 2/6, medium pitch at 2nd left intercostal space Extremities: edema 1+ bilateral lower extremity edema  EKG normal sinus rhythm at 72 without ST or T wave changes  ASSESSMENT AND PLAN:   Bilateral lower extremity edema Lower lower extremity Dopplers were performed 02/07/13 that showed normal ABIs.his diuretics were adjusted. He denies salt intake. He does have improvement in his edema though his weight has not changed. A BMP was measured at 16. He is alert Aci-jel blocker (amlodipine 2.5 mg). I am going to stop his amlodipine and add losartan. I will check a beam at in 2 weeks and have him see Huey Bienenstock PAC back 2 weeks thereafter.  HYPERTENSION Borderline control on current medications. We'll stop up and Lodine because of swelling and start losartan  CEREBROVASCULAR DISEASE Stable by duplex ultrasound. Will repeat his Dopplers in August of this year      Runell Gess MD [facility], [facility] 02/17/2013 4:42 PM

## 2013-02-17 NOTE — Assessment & Plan Note (Signed)
Borderline control on current medications. We'll stop up and Lodine because of swelling and start losartan

## 2013-02-17 NOTE — Assessment & Plan Note (Signed)
Lower lower extremity Dopplers were performed 02/07/13 that showed normal ABIs.his diuretics were adjusted. He denies salt intake. He does have improvement in his edema though his weight has not changed. A BMP was measured at 16. He is alert Aci-jel blocker (amlodipine 2.5 mg). I am going to stop his amlodipine and add losartan. I will check a beam at in 2 weeks and have him see Eric [name] PAC back 2 weeks thereafter.

## 2013-02-17 NOTE — Patient Instructions (Signed)
  Your physician wants you to follow-up with him in : 6 MONTHS                                            and with an extender in : 4 WEEKS                    You will receive a reminder letter in the mail one month in advance. If you don't receive a letter, please call our office to schedule the follow-up appointment.   Your physician recommends that you return for lab work in: 2 WEEKS   Your physician has recommended you make the following change in your medication: STOP AMLODIPINE AND START LOSARTAN 50 MG DAILY   Your physician has ordered the following tests: CAROTID DOPPLERS IN [facility]

## 2013-02-17 NOTE — Assessment & Plan Note (Signed)
Stable by duplex ultrasound. Will repeat his Dopplers in August of this year

## 2013-03-03 DIAGNOSIS — Z79899 Other long term (current) drug therapy: Secondary | ICD-10-CM | POA: Diagnosis not present

## 2013-03-03 LAB — BASIC METABOLIC PANEL
Glucose, Bld: 141 mg/dL — ABNORMAL HIGH (ref 70–99)
Potassium: 4.7 mEq/L (ref 3.5–5.3)
Sodium: 143 mEq/L (ref 135–145)

## 2013-03-17 ENCOUNTER — Encounter: Payer: Self-pay | Admitting: Physician Assistant

## 2013-03-17 ENCOUNTER — Ambulatory Visit (INDEPENDENT_AMBULATORY_CARE_PROVIDER_SITE_OTHER): Payer: Medicare Other | Admitting: Physician Assistant

## 2013-03-17 VITALS — BP 150/80 | HR 64 | Ht 68.0 in | Wt 211.6 lb

## 2013-03-17 DIAGNOSIS — I10 Essential (primary) hypertension: Secondary | ICD-10-CM | POA: Diagnosis not present

## 2013-03-17 DIAGNOSIS — I2510 Atherosclerotic heart disease of native coronary artery without angina pectoris: Secondary | ICD-10-CM

## 2013-03-17 DIAGNOSIS — R609 Edema, unspecified: Secondary | ICD-10-CM

## 2013-03-17 DIAGNOSIS — R079 Chest pain, unspecified: Secondary | ICD-10-CM

## 2013-03-17 DIAGNOSIS — R600 Localized edema: Secondary | ICD-10-CM

## 2013-03-17 MED ORDER — LOSARTAN POTASSIUM 50 MG PO TABS: 50.0000 mg | ORAL_TABLET | Freq: Two times a day (BID) | ORAL | Status: DC

## 2013-03-17 NOTE — Progress Notes (Signed)
Date:  03/17/2013   ID:  Eric [name], DOB 1935/11/14, MRN [id number]  PCP:  Michele Mcalpine, MD  Primary Cardiologist:  Allyson Sabal   History of Present Illness: Eric [name] is a 77 y.o. male  The patient is a 77 year old overweight Caucasian male who is a patient of Dr. Allyson Sabal. He has a history of coronary artery disease and PVOD. He had a stent to the LAD and diagonal branch back on July 14, 2003, with subsequent stenting by Dr. Elsie Lincoln July 31, 2003. He had mild bilateral renal artery stenosis at catheterization and blockages in the 40% range as well as moderate right internal carotid artery stenosis by duplex ultrasound. He also has hypertension, hyperlipidemia, and obstructive sleep apnea which is followed by Dr. Tresa Endo. He was last seen August 12, 2012.  Last 2-D echocardiogram was 02/07/2013 and revealed an ejection fraction of 60-65% with normal wall motion. He also had normal diastolic function parameters. Aortic valve area is 1.38 cm.  Lower extremity arterial Dopplers were mildly abnormal with a right ABI of 1.1 the left of 0.99.    Patient presents today for followup for persistent lower extremity edema.  During the last office visit his amlodipine was stopped and he was started on losartan.  He continues to have lower extremity edema is essentially unchanged. He does state that it does improve in the morning and he gets persistently worse as the day goes by.  He also reports chest pain which is worsened with exertion. This ongoing for more than 2 years and his chronic catheterization was in April 2012 showed patent stents in RCA LAD diagonals and otherwise normal coronaries  The patient currently denies nausea, vomiting, fever, shortness of breath, orthopnea, dizziness, PND, cough, congestion, abdominal pain, hematochezia, melena, claudication.     Wt Readings from Last 3 Encounters:  03/17/13 211 lb 9.6 oz (95.981 kg)  02/17/13 214 lb (97.07 kg)  07/15/12 212 lb (96.163 kg)       Past Medical History  Diagnosis Date  . Dizziness and giddiness   . Unspecified essential hypertension   . Coronary atherosclerosis of unspecified type of vessel, native or graft   . Unspecified hearing loss   . Cerebrovascular disease, unspecified   . Peripheral vascular disease, unspecified   . Other and unspecified hyperlipidemia   . Irritable bowel syndrome   . Benign neoplasm of colon   . Calculus of kidney   . Myalgia and myositis, unspecified   . Anxiety state, unspecified   . OSA (obstructive sleep apnea)   . Bilateral lower extremity edema     Current Outpatient Prescriptions  Medication Sig Dispense Refill  . aspirin 81 MG tablet Take 81 mg by mouth daily.        Marland Kitchen atorvastatin (LIPITOR) 80 MG tablet Take 80 mg by mouth at bedtime.       . clopidogrel (PLAVIX) 75 MG tablet Take 75 mg by mouth daily.        . diazepam (VALIUM) 5 MG tablet TAKE 1 TABLET EVERY 8 HOURS AS NEEDED  90 tablet  1  . ezetimibe (ZETIA) 10 MG tablet Take 10 mg by mouth daily.        . furosemide (LASIX) 20 MG tablet Take 20 mg by mouth. Take 1 tablet & alternate with 2 tablets every other day      . lansoprazole (PREVACID) 15 MG capsule Take 15 mg by mouth daily.        Marland Kitchen losartan (  COZAAR) 50 MG tablet Take 1 tablet (50 mg total) by mouth 2 (two) times daily.  90 tablet  3  . meclizine (ANTIVERT) 25 MG tablet TAKE ONE TABLET BY MOUTH EVERY 8 HOURS AS NEEDED FOR DIZZINESS  90 tablet  5  . metoprolol (LOPRESSOR) 50 MG tablet Take 1 1/2  Tablet by mouth two times daily.        No current facility-administered medications for this visit.    Allergies:    Allergies  Allergen Reactions  . Niaspan (Niacin Er)   . Sertraline Hcl     REACTION: disorientation  . Tetanus Toxoids     Social History:  The patient  reports that he quit smoking about 48 years ago. His smoking use included Cigarettes. He has a 15 pack-year smoking history. He has never used smokeless tobacco. He reports that he does  not drink alcohol or use illicit drugs.   Family History  Problem Relation Age of Onset  . Heart disease Brother   . Heart disease Father   . Lung cancer Brother   . Breast cancer Sister     ROS:  Please see the history of present illness.  All other systems reviewed and negative.   PHYSICAL EXAM: VS:  BP 150/80  Pulse 64  Ht 5\' 8"  (1.727 m)  Wt 211 lb 9.6 oz (95.981 kg)  BMI 32.18 kg/m2 Obese, well developed, in no acute distress HEENT: Pupils are equal round react to light accommodation extraocular movements are intact.  Neck: no JVDNo cervical lymphadenopathy. Cardiac: Regular rate and rhythm with 2/6 systolic murmur Lungs:  clear to auscultation bilaterally, no wheezing, rhonchi or rales Abd: soft, nontender, positive bowel sounds all quadrants, no hepatosplenomegaly Ext: 2+ lower extremity edema.  2+ radial and dorsalis pedis pulses. Skin: warm and dry Neuro:  Grossly normal   ASSESSMENT AND PLAN:  Problem List Items Addressed This Visit   Bilateral lower extremity edema     Continued lower extreme edema after discontinuance of amlodipine. Other than mild aortic stenosis he has a normal 2-D echocardiogram.  My guess is his lower extremity edema is related to venous insufficiency. I prescribed him to wear compression hose.   Lower extremity venous studies may be required the future.    Relevant Orders      Compression stockings   Chest pain on exertion     Patient reported this pain has been ongoing for the last 2 years. I doubt it is cardiac in nature and may be more related to his fibromyalgia.  His last nuclear stress test was approximately 2 years ago nonischemic. Last cardiac catheterization was April 2012 showed patent stents and otherwise normal coronaries.    CORONARY ARTERY DISEASE     Most recent nuclear stress test was March 2012 nonischemic with post-rest ejection fraction 73%.    Relevant Medications      losartan (COZAAR) tablet   HYPERTENSION      Patient continues to have poorly controlled blood pressure. Will increase losartan 50 mg twice daily.      Relevant Medications      losartan (COZAAR) tablet    Other Visit Diagnoses   CAD (coronary artery disease)    -  Primary    Relevant Medications       losartan (COZAAR) tablet

## 2013-03-17 NOTE — Patient Instructions (Addendum)
Call our office in 3 weeks to let me know if the compression stockings are working. He may then scheduled venous ultrasound of your legs. R calf 14.5 in R ankle 8 in L calf 14.75 in L ankle 8 in #2

## 2013-03-17 NOTE — Assessment & Plan Note (Signed)
Most recent nuclear stress test was March 2012 nonischemic with post-rest ejection fraction 73%.

## 2013-03-17 NOTE — Assessment & Plan Note (Signed)
Continued lower extreme edema after discontinuance of amlodipine. Other than mild aortic stenosis he has a normal 2-D echocardiogram.  My guess is his lower extremity edema is related to venous insufficiency. I prescribed him to wear compression hose.   Lower extremity venous studies may be required the future.

## 2013-03-17 NOTE — Assessment & Plan Note (Signed)
Patient continues to have poorly controlled blood pressure. Will increase losartan 50 mg twice daily.

## 2013-03-17 NOTE — Assessment & Plan Note (Signed)
Patient reported this pain has been ongoing for the last 2 years. I doubt it is cardiac in nature and may be more related to his fibromyalgia.  His last nuclear stress test was approximately 2 years ago nonischemic. Last cardiac catheterization was April 2012 showed patent stents and otherwise normal coronaries.

## 2013-03-31 ENCOUNTER — Other Ambulatory Visit: Payer: Self-pay | Admitting: Cardiovascular Disease

## 2013-04-01 NOTE — Telephone Encounter (Signed)
Rx was sent to pharmacy electronically. 

## 2013-04-07 ENCOUNTER — Telehealth: Payer: Self-pay | Admitting: Cardiovascular Disease

## 2013-04-07 NOTE — Telephone Encounter (Signed)
Calling to Let Eric [name] Know that his legs are retaining some fluids and pain is not as severe and all the time like it was before. Please call him if you have any questions .Marland Kitchen     Thanks

## 2013-04-28 ENCOUNTER — Other Ambulatory Visit (HOSPITAL_COMMUNITY): Payer: Self-pay | Admitting: Cardiovascular Disease

## 2013-04-28 DIAGNOSIS — I739 Peripheral vascular disease, unspecified: Secondary | ICD-10-CM

## 2013-05-12 ENCOUNTER — Other Ambulatory Visit: Payer: Self-pay | Admitting: Pulmonary Disease

## 2013-05-14 ENCOUNTER — Telehealth: Payer: Self-pay | Admitting: Pulmonary Disease

## 2013-05-14 MED ORDER — DIAZEPAM 5 MG PO TABS: ORAL_TABLET | ORAL | Status: DC

## 2013-05-14 NOTE — Telephone Encounter (Signed)
I spoke with pt and appt scheduled. RX has been called into [facility]. Nothing further needed

## 2013-05-23 ENCOUNTER — Ambulatory Visit (HOSPITAL_BASED_OUTPATIENT_CLINIC_OR_DEPARTMENT_OTHER)
Admission: RE | Admit: 2013-05-23 | Discharge: 2013-05-23 | Disposition: A | Discharge status: Home / Self Care | Payer: Medicare Other | Source: Ambulatory Visit | Attending: Cardiovascular Disease | Admitting: Cardiovascular Disease

## 2013-05-23 ENCOUNTER — Encounter (HOSPITAL_COMMUNITY): Payer: Medicare Other

## 2013-05-23 ENCOUNTER — Ambulatory Visit (HOSPITAL_COMMUNITY)
Admission: RE | Admit: 2013-05-23 | Discharge: 2013-05-23 | Disposition: A | Discharge status: Home / Self Care | Payer: Medicare Other | Source: Ambulatory Visit | Attending: Cardiovascular Disease | Admitting: Cardiovascular Disease

## 2013-05-23 DIAGNOSIS — I701 Atherosclerosis of renal artery: Secondary | ICD-10-CM | POA: Diagnosis not present

## 2013-05-23 DIAGNOSIS — I739 Peripheral vascular disease, unspecified: Secondary | ICD-10-CM | POA: Insufficient documentation

## 2013-05-23 DIAGNOSIS — I6529 Occlusion and stenosis of unspecified carotid artery: Secondary | ICD-10-CM

## 2013-05-23 DIAGNOSIS — I679 Cerebrovascular disease, unspecified: Secondary | ICD-10-CM

## 2013-05-23 NOTE — Progress Notes (Signed)
Renal artery duplex complete. GMG

## 2013-05-23 NOTE — Progress Notes (Signed)
Carotid duplex complete. [facility]

## 2013-05-26 DIAGNOSIS — L821 Other seborrheic keratosis: Secondary | ICD-10-CM | POA: Diagnosis not present

## 2013-05-26 DIAGNOSIS — D485 Neoplasm of uncertain behavior of skin: Secondary | ICD-10-CM | POA: Diagnosis not present

## 2013-05-26 DIAGNOSIS — C44319 Basal cell carcinoma of skin of other parts of face: Secondary | ICD-10-CM | POA: Diagnosis not present

## 2013-05-29 ENCOUNTER — Telehealth: Payer: Self-pay | Admitting: *Deleted

## 2013-05-29 ENCOUNTER — Encounter: Payer: Self-pay | Admitting: *Deleted

## 2013-05-29 ENCOUNTER — Telehealth: Payer: Self-pay | Admitting: Cardiovascular Disease

## 2013-05-29 DIAGNOSIS — I739 Peripheral vascular disease, unspecified: Secondary | ICD-10-CM

## 2013-05-29 NOTE — Telephone Encounter (Signed)
Please call- doctor found cancer on his face-he is on Plavix-need to know if he needs to stop it? If so how long?

## 2013-05-29 NOTE — Telephone Encounter (Signed)
Message copied by Marella Bile on Thu May 29, 2013  1:03 PM ------      Message from: Runell Gess      Created: Wed May 28, 2013  5:10 PM       No change from prior study. Repeat in 12 months. ------

## 2013-05-29 NOTE — Telephone Encounter (Signed)
Message forwarded to Dr. Berry. 

## 2013-05-30 NOTE — Telephone Encounter (Signed)
Letter created,but upon talking with Eric [name], Mr Salek does not have to stop his plavix prior to the procedure.  I advised her that if he had to he could. She verbalized understanding.

## 2013-05-30 NOTE — Telephone Encounter (Signed)
Please advise 

## 2013-05-30 NOTE — Telephone Encounter (Signed)
Need to stop for five days prior to any procedure then restart once stable from a bleeding risk standpoint.  Eric [name] 11:51 AM

## 2013-06-12 ENCOUNTER — Ambulatory Visit: Payer: Medicare Other | Admitting: Pulmonary Disease

## 2013-06-12 DIAGNOSIS — Z85828 Personal history of other malignant neoplasm of skin: Secondary | ICD-10-CM | POA: Diagnosis not present

## 2013-06-12 DIAGNOSIS — C44319 Basal cell carcinoma of skin of other parts of face: Secondary | ICD-10-CM | POA: Diagnosis not present

## 2013-06-16 ENCOUNTER — Encounter: Payer: Self-pay | Admitting: Physician Assistant

## 2013-06-16 ENCOUNTER — Ambulatory Visit (INDEPENDENT_AMBULATORY_CARE_PROVIDER_SITE_OTHER): Payer: Medicare Other | Admitting: Physician Assistant

## 2013-06-16 VITALS — BP 112/70 | HR 66 | Ht 68.0 in | Wt 210.0 lb

## 2013-06-16 DIAGNOSIS — R079 Chest pain, unspecified: Secondary | ICD-10-CM | POA: Diagnosis not present

## 2013-06-16 DIAGNOSIS — I10 Essential (primary) hypertension: Secondary | ICD-10-CM

## 2013-06-16 DIAGNOSIS — R609 Edema, unspecified: Secondary | ICD-10-CM

## 2013-06-16 DIAGNOSIS — R600 Localized edema: Secondary | ICD-10-CM

## 2013-06-16 NOTE — Progress Notes (Signed)
Date:  06/16/2013   ID:  Eric [name], DOB 1936-05-03, MRN [id number]  PCP:  Michele Mcalpine, MD  Primary Cardiologist:  berry     History of Present Illness: Eric [name] is a 77 y.o. male overweight Caucasian male who is a patient of Dr. Allyson Sabal. He has a history of coronary artery disease and PVOD. He had a stent to the LAD and diagonal branch back on July 14, 2003, with subsequent stenting by Dr. Elsie Lincoln July 31, 2003. He had mild bilateral renal artery stenosis at catheterization and blockages in the 40% range as well as moderate right internal carotid artery stenosis by duplex ultrasound. He also has hypertension, hyperlipidemia, and obstructive sleep apnea which is followed by Dr. Tresa Endo. He was last seen August 12, 2012. Last 2-D echocardiogram was 02/07/2013 and revealed an ejection fraction of 60-65% with normal wall motion. He also had normal diastolic function parameters. Aortic valve area is 1.38 cm. Lower extremity arterial Dopplers were mildly abnormal with a right ABI of 1.1 the left of 0.99.   2 office visits ago his amlodipine was stopped due to lower extremity edema and he was started on losartan. He continues to have lower extremity edema is essentially unchanged. He does state that it does improve in the morning and he gets persistently worse as the day goes by. He also reports chest pain which is worsened with exertion. This ongoing for more than 2 years and his chronic catheterization was in April 2012 showed patent stents in RCA LAD diagonals and otherwise normal coronaries.  Last nuclear stress test was March 2012 ejection fraction was 73% and was negative for ischemia.  He continues to experience chest pain with heavy exertion so he limits his exertion.  He has not needed to take any nitroglycerin and reports he's not interested in having a stress test.  He recently had a skin biopsy to the right temporal region.  He also states that he will not wear compression hose.   He otherwise denies nausea, vomiting, fever, shortness of breath, orthopnea, dizziness, PND, cough, congestion, abdominal pain, hematochezia, melena, lower extremity edema, claudication.  Wt Readings from Last 3 Encounters:  06/16/13 210 lb (95.255 kg)  03/17/13 211 lb 9.6 oz (95.981 kg)  02/17/13 214 lb (97.07 kg)     Past Medical History  Diagnosis Date  . Dizziness and giddiness   . Unspecified essential hypertension   . Coronary atherosclerosis of unspecified type of vessel, native or graft   . Unspecified hearing loss   . Cerebrovascular disease, unspecified   . Peripheral vascular disease, unspecified   . Other and unspecified hyperlipidemia   . Irritable bowel syndrome   . Benign neoplasm of colon   . Calculus of kidney   . Myalgia and myositis, unspecified   . Anxiety state, unspecified   . OSA (obstructive sleep apnea)   . Bilateral lower extremity edema     Current Outpatient Prescriptions  Medication Sig Dispense Refill  . aspirin 81 MG tablet Take 81 mg by mouth daily.        Marland Kitchen atorvastatin (LIPITOR) 80 MG tablet TAKE 1 TABLET AT BEDTIME  30 tablet  10  . clopidogrel (PLAVIX) 75 MG tablet Take 75 mg by mouth daily.        . diazepam (VALIUM) 5 MG tablet TAKE 1 TABLET EVERY 8 HOURS AS NEEDED  90 tablet  0  . doxycycline (VIBRA-TABS) 100 MG tablet Take 100 mg by mouth 2 (two)  times daily. CA area removed from rt. Side of face      . ezetimibe (ZETIA) 10 MG tablet Take 10 mg by mouth daily.        . furosemide (LASIX) 20 MG tablet Take 20 mg by mouth. Take 1 tablet & alternate with 2 tablets every other day      . lansoprazole (PREVACID) 15 MG capsule Take 15 mg by mouth daily.        Marland Kitchen losartan (COZAAR) 50 MG tablet Take 1 tablet (50 mg total) by mouth 2 (two) times daily.  90 tablet  3  . meclizine (ANTIVERT) 25 MG tablet TAKE ONE TABLET BY MOUTH EVERY 8 HOURS AS NEEDED FOR DIZZINESS  90 tablet  5  . metoprolol (LOPRESSOR) 50 MG tablet Take 1 1/2  Tablet by mouth two  times daily.        No current facility-administered medications for this visit.    Allergies:    Allergies  Allergen Reactions  . Niaspan [Niacin Er]   . Sertraline Hcl     REACTION: disorientation  . Tetanus Toxoids     Social History:  The patient  reports that he quit smoking about 48 years ago. His smoking use included Cigarettes. He has a 15 pack-year smoking history. He has never used smokeless tobacco. He reports that he does not drink alcohol or use illicit drugs.   Family history:   Family History  Problem Relation Age of Onset  . Heart disease Brother   . Heart disease Father   . Lung cancer Brother   . Breast cancer Sister     ROS:  Please see the history of present illness.  All other systems reviewed and negative.   PHYSICAL EXAM: VS:  BP 112/70  Pulse 66  Ht 5\' 8"  (1.727 m)  Wt 210 lb (95.255 kg)  BMI 31.94 kg/m2 Well nourished, well developed, in no acute distress HEENT: Pupils are equal round react to light accommodation extraocular movements are intact.  Neck: no JVDNo cervical lymphadenopathy. Cardiac: Regular rate and rhythm with 1/6 systolic murmur Lungs:  clear to auscultation bilaterally, no wheezing, rhonchi or rales Abd: soft, nontender, positive bowel sounds all quadrants, no hepatosplenomegaly Ext: Trace lower extremity edema.  2+ radial and dorsalis pedis pulses. Skin: warm and dry Neuro:  Grossly normal  EKG: Normal Sinus rhythm, rate 66 beats per minute, inferior Q waves  ASSESSMENT AND PLAN:  Problem List Items Addressed This Visit   HYPERTENSION     Blood pressure is considerably better controlled at this time.    Chest pain on exertion     Patient refuses stress test at this time.    Bilateral lower extremity edema - Primary     Improvement noted in lower extremity edema.

## 2013-06-16 NOTE — Assessment & Plan Note (Signed)
Blood pressure is considerably better controlled at this time.

## 2013-06-16 NOTE — Patient Instructions (Signed)
Followup with Dr. Allyson Sabal in 6 months or sooner if needed

## 2013-06-16 NOTE — Assessment & Plan Note (Signed)
Improvement noted in lower extremity edema.

## 2013-06-16 NOTE — Assessment & Plan Note (Signed)
Patient refuses stress test at this time.

## 2013-06-25 DIAGNOSIS — Z23 Encounter for immunization: Secondary | ICD-10-CM | POA: Diagnosis not present

## 2013-06-28 DIAGNOSIS — J018 Other acute sinusitis: Secondary | ICD-10-CM | POA: Diagnosis not present

## 2013-07-07 ENCOUNTER — Ambulatory Visit (INDEPENDENT_AMBULATORY_CARE_PROVIDER_SITE_OTHER)
Admission: RE | Admit: 2013-07-07 | Discharge: 2013-07-07 | Disposition: A | Discharge status: Home / Self Care | Payer: Medicare Other | Source: Ambulatory Visit | Attending: Pulmonary Disease | Admitting: Pulmonary Disease

## 2013-07-07 ENCOUNTER — Encounter: Payer: Self-pay | Admitting: Pulmonary Disease

## 2013-07-07 ENCOUNTER — Ambulatory Visit (INDEPENDENT_AMBULATORY_CARE_PROVIDER_SITE_OTHER): Payer: Medicare Other | Admitting: Pulmonary Disease

## 2013-07-07 VITALS — BP 110/62 | HR 62 | Temp 97.9°F | Ht 68.0 in | Wt 205.6 lb

## 2013-07-07 DIAGNOSIS — R7309 Other abnormal glucose: Secondary | ICD-10-CM

## 2013-07-07 DIAGNOSIS — D126 Benign neoplasm of colon, unspecified: Secondary | ICD-10-CM

## 2013-07-07 DIAGNOSIS — IMO0001 Reserved for inherently not codable concepts without codable children: Secondary | ICD-10-CM

## 2013-07-07 DIAGNOSIS — I10 Essential (primary) hypertension: Secondary | ICD-10-CM

## 2013-07-07 DIAGNOSIS — N200 Calculus of kidney: Secondary | ICD-10-CM

## 2013-07-07 DIAGNOSIS — I2510 Atherosclerotic heart disease of native coronary artery without angina pectoris: Secondary | ICD-10-CM

## 2013-07-07 DIAGNOSIS — E785 Hyperlipidemia, unspecified: Secondary | ICD-10-CM

## 2013-07-07 DIAGNOSIS — G4733 Obstructive sleep apnea (adult) (pediatric): Secondary | ICD-10-CM

## 2013-07-07 DIAGNOSIS — K589 Irritable bowel syndrome without diarrhea: Secondary | ICD-10-CM

## 2013-07-07 DIAGNOSIS — N320 Bladder-neck obstruction: Secondary | ICD-10-CM

## 2013-07-07 DIAGNOSIS — F411 Generalized anxiety disorder: Secondary | ICD-10-CM

## 2013-07-07 DIAGNOSIS — I679 Cerebrovascular disease, unspecified: Secondary | ICD-10-CM | POA: Diagnosis not present

## 2013-07-07 DIAGNOSIS — I739 Peripheral vascular disease, unspecified: Secondary | ICD-10-CM

## 2013-07-07 MED ORDER — DIAZEPAM 5 MG PO TABS: ORAL_TABLET | ORAL | Status: DC

## 2013-07-07 MED ORDER — MECLIZINE HCL 25 MG PO TABS: ORAL_TABLET | ORAL | Status: DC

## 2013-07-07 NOTE — Patient Instructions (Addendum)
Today we updated your med list in our EPIC system...    Continue your current medications the same...    We refilled the meds you requested...  Your BP today is 110/62 on your regular meds and the Losartan 50mg /d- keep same...  Today we did a follow up CXR... Please return to our lab one morning this week for your FASTING blood work...    We will contact you w/ the results when available...   Let's get on track w/ our diet (low carb & low fat) & exercise program- [facility]  Call for any questions...  Let's plan a follow up visit in 15yr, sooner if needed for problems.Marland KitchenMarland Kitchen

## 2013-07-07 NOTE — Progress Notes (Signed)
Subjective:    Patient ID: Eric [name], male    DOB: 1936-01-09, 77 y.o.   MRN: [id number]  HPI 77 y/o WM here for a follow up visit... he has multiple medical problems as noted below...    ~  March 23, 2010:  CPX> doing well, no complaints; his Meniere's is controlled but Aldactazide caused some breast tissue enlargement, and I offered to switch it to HCT but he declined the change; weight is up to 207# & we reviewed diet + exercise; Labs showed elv TG's & he is reminded to stay on a low fat diet...  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 He would like me to refill his Aldactazide, Meclizine, & Valium today> his renal function & K+ are OK... See prob list below>>  ~  December 06, 2011:  9mo ROV & Rawn reports doing well overall, no new complaonts or concerns, wants his Valium refilled...    OSA> on CPAP per DrKelly, [facility]; we don't have Sleep study data; states he's doing satis on mask/ CPAP; he follows w/ DrKelly once per yr...    HBP> on Meptop75Bid, Amlodip2.5, Aldactazide 25-25 Bid he says; BP=140/78 & he denies CP, palpit, syncope, SOB, edema, etc...    CAD> on ASA/ Plavix, plus other meds listed; doing satis w/o angina etc; he follow up w/ DrBerry every 55mo- last seen 9/12 & has f/u soon...    Cerebrovasc Dis> on ASA/ Plavix; he denies cerebral ischemic symptoms; CDopplers per [facility]- ?when last & our records indicate 60-79% RICA stenosis w/ bruit.    Hyperlipid> on Lip80, Zetia10, Fish Oil; FLP today showed TChol 125, TG 153, HDL 37, LDL 58; rec better low fat diet & incr  exercise.    GI> IBS, Polyps> on Prev15; he denies abd pain, n/v/d/c or blood in stools; last colon 2/07 & he is due for f/u DrStark...    Hx KidStones> he is followed by DrGrapey for Urology; no recent problems...    Anxiety> on Valium5mg  tid prn; he requests refill... LABS 3/13:  FLP- ok on Lip80&Zetia;  Chems- ok x BS=129 & needs low carb diet;  CBC- wnl;  TSH=1.63;  PSA=0.32;  BNP=45  ~  July 15, 2012:  9mo ROV & Delynn is stable but has persist chest discomfort w/ activity, stress, etc; DrBerry is his Cardiologist & he is aware w/ prev work ups etc...     Amritpal continues on CPAP nightly by [facility], resting well, wakes refreshed, no issues w/ daytime hypersomnolence;  BP controlled on Metop & Aldactazide;  CAD followed by DrBerry on ASA/ Plavix;  Known cerebrovasc dis on ASA/ Plavis & CDopplers followed by DrBerry as well;  Chol treated w/ Lip80 + Zetia & last FLP done by [facility] per pt but we don't have recent notes;  He remains quite anxious on Valium as needed...    We reviewed prob list, meds, xrays and labs> see below for updates >> he had the  2013 Flu vaccine in Sept... LABS 10/13:  Chems- ok w/ BS=105 A1c=6.8  ~  July 07, 2013:  67yr ROV & [name] states that he is doing well- no new complaints or concerns; he is still working for [employer]; he CC is poor energy & we discussed a men's formula MVI, daily exercise (try silver sneakers), etc... We reviewed the following medical problems during today's office visit >>     OSA> on CPAP per Howell Rucks, [facility]; we don't have Sleep study data; states he's doing satis on mask/ CPAP (he doesn't know pressure); reminded to follow up w/ DrKelly once per yr...    HBP> on Meptop75Bid, Losartan50, Lasix20- 1 alt w/ 2 Qod; BP=110/62 & he denies CP, palpit, syncope, SOB, edema, etc...    CAD> on ASA81/ Plavix, plus other meds listed; doing satis w/o angina etc; he follow up w/ DrBerry every 76mo- last seen 9/14 & notes reviewed.    Cerebrovasc Dis> on ASA/  Plavix; he denies cerebral ischemic symptoms; CDopplers per [facility]- last done 8/14 w/ 50-69% right ICA stenosis & 0-49% left ICA stenosis...    Hyperlipid> on Lip80, Zetia10, Fish Oil; FLP today showed TChol 125, TG 219, HDL 31, LDL 63; rec better low fat diet & incr exercise.    GI> IBS, Polyps> on Prev15; he denies abd pain, n/v/d/c or blood in stools; last colon 2/07 & he is due for f/u DrStark & has received 2 letters...    Hx KidStones> he is followed by DrGrapey for Urology; no recent problems...    Anxiety> on Valium5mg  tid prn; he requests refill... We reviewed prob list, meds, xrays and labs> see below for updates >> he had the 2014 flu vaccine 9/14...  CXR 10/14 showed norm heart size, clear lungs, NAD... LABS 10/14:  FLP- ok x TG=219;  Chems- ok w/ BS=122, A1c=6.6;  CBC- wnl;  TSH=1.25;  PSA=0.62...    PROBLEM LIST:    DIZZINESS (ICD-780.4) & HEARING LOSS (ICD-389.9) - see extensive eval by Rosine Beat 3/10 w/ Audiogram showing high freq loss that was similar to study in 7/09... Labs essent norm x increased catecholamines on 24H urine (?poss related to meds & off Imipramine now)... MRI Brain w/ small vessel disease otherw neg... CT Abd neg- normal adrenals bilat... he was given a course of oral PRED- not much change in hearing & f/u w/ DrKraus > prob Meniere's disease- tube placed in right ear 6/10, started on Aldactazide, uses Meclizine & Valium Prn... he did see DrLove 1/11 who agreed w/ the Dx.  OSA >> He had Sleep Study by [facility]; we don't have the results but he was placed on CPAP by [facility] & he continues on CPAP & sees [place] yearly...  HYPERTENSION (ICD-401.9) - controllled on meds per [facility]... ~  On METOPROLOL 50mg - 1.5 tabsBid, & ALDACTAZIDE 25-25 taken Bid... ~  2DEcho 7/09 by [facility] was normal... ~  CXR 4/12 showed normal heart size, clear lungs, NAD... ~  2DEcho repeated by [facility] 4/12 but we don't have this report... ~  3/13:  BP= 140/78 & he denies HA, visual symptoms, CP, palpit,  syncope, etc... prev mild LE edema resolved w/ the diuretic... ~  10/13:  BP= 132/74 & he denies palpit, ch in SOB, edema, etc...  ~  10/14: on Meptop75Bid, Losartan50, Lasix20- 1 alt w/ 2 Qod; BP=110/62 & he denies CP, palpit, syncope, SOB, edema, etc.  CORONARY ARTERY DISEASE (ICD-414.00) - on ASA 81mg /d + PLAVIX 75mg /d... known CAD followed by [facility]= he had  both diagonal branch & RCA stents placed in 2004, and f/u cath Jan10 showed patent stents... ~  Myoview 11/09 by [facility] was negative... ~  Event recorder as part of his dizziness work up was reported no signif arrhythmias. ~  Myoview 3/12 showed neg- no ischemia or infarct, norm regional wall motion, EF= 73%... ~  Cath 4/12 by DrBerry showed patent grafts & normal LV... ~  OV w/ DrBerry 8/12> stable OSA, HBP, CAD w/ PTCA/ stents, carotid dis; DrBerry checks him every 16mo... ~  3/13:  OV note from DrBerry reviewed> stable, no changes made... ~  2014 notes reviewed & pt stable...  CEREBROVASCULAR DISEASE (ICD-437.9) - hx of carotid disease followed by [facility] w/ CDopplers... ~  CDopplers w/ 60-79% right carotid stenosis... continues on ASA/ Plavix. ~  10/14: on ASA/ Plavix; he denies cerebral ischemic symptoms; CDopplers per [facility]- last done 8/14 w/ 50-69% right ICA stenosis & 0-49% left ICA stenosis  PERIPHERAL VASCULAR DISEASE (ICD-443.9) - known mild RAS followed by ultrasounds at the [facility] office... ~  Dopplers w/ 40% bilat RAS as noted but cath showed that renal arts looked good...  HYPERLIPIDEMIA (ICD-272.4) - on LIPITOR 80mg /d, ZETIA 10mg /d, FISH OIL 1000mg Bid... ~  FLP's followed by [facility] (we don't have recent numbers to review). ~  FLP 6/11 showed TChol 166, TG 425, HDL 32, LDL 78... may need fibrate- defer to [facility]. ~  Subsequent labs done by [facility]... ~  FLP here 3/13 on Lip80+Zetia10 showed TChol 125, TG 153, HDL 37, LDL 58 ~  FLP 10/14 on Lip80+Zetia10 showed TChol 125, TG 219, HDL 31, LDL 63... Needs better low fat diet & wt  reduction...  IRRITABLE BOWEL SYNDROME (ICD-564.1) & COLONIC POLYPS (ICD-211.3) - followed for GI by DrStark & last colonoscopy was 2/07 showing mult 3-27mm polyps= tubular adenomas and f/u plasnned 12yrs.  NEPHROLITHIASIS (ICD-592.0) - he is followed for Urology by DrGrapey and the pt tells me that he does his routine PSA screening as well- ?when this was done last? ~  labs here 6/11 showed PSA= 0.16 ~  Labs here 10/14 showed PSA= 0.62  FIBROMYALGIA (ICD-729.1) - he has carried this diagnosis for yrs based on clinical symptoms and was treated w/ Imipramine 25mg  Bid until it was discontinued 3/10...  ANXIETY (ICD-300.00) - on DIAZEPAM 5mg - 1/2 to 1 tab by mouth three times a day Prn... he reports a prev reaction to Zoloft.  NOTE: Hx of ABNORMAL URINARY CATECHOL/METANEPHRINES - eval by ?DrKraus, DrEllison, DrLove> CT Abd was neg & MIBG scan was also neg- no adrenal lesions seen... poss related to Imipramine Rx  Health Maintenance - he gets the yearly flu vaccine each fall... he had a TETANUS shot in 2003... he had PNEUMOVAX in 2007... he also received the SHINGLES vaccine in Oct08... he also takes MVI, Protegra, Vit C...   Past Surgical History  Procedure Laterality Date  . Tube right ear    . Nm myoview ltd  [id number]    post stress ejection fraction is 73% normal myocardial perfusion study, low risk scan    Outpatient Encounter Prescriptions as of 07/07/2013  Medication Sig Dispense Refill  . aspirin 81 MG tablet Take 81 mg by mouth daily.        Marland Kitchen atorvastatin (LIPITOR) 80 MG tablet TAKE 1 TABLET AT BEDTIME  30 tablet  10  . clopidogrel (PLAVIX) 75 MG tablet Take 75 mg by mouth daily.        . diazepam (VALIUM) 5 MG tablet TAKE  1 TABLET EVERY 8 HOURS AS NEEDED  90 tablet  0  . ezetimibe (ZETIA) 10 MG tablet Take 10 mg by mouth daily.        . furosemide (LASIX) 20 MG tablet Take 20 mg by mouth. Take 1 tablet & alternate with 2 tablets every other day      . lansoprazole (PREVACID) 15 MG  capsule Take 15 mg by mouth daily.        Marland Kitchen losartan (COZAAR) 50 MG tablet Take 1 tablet (50 mg total) by mouth 2 (two) times daily.  90 tablet  3  . meclizine (ANTIVERT) 25 MG tablet TAKE ONE TABLET BY MOUTH EVERY 8 HOURS AS NEEDED FOR DIZZINESS  90 tablet  5  . metoprolol (LOPRESSOR) 50 MG tablet Take 1 1/2  Tablet by mouth two times daily.        No facility-administered encounter medications on file as of 07/07/2013.    Allergies  Allergen Reactions  . Niaspan [Niacin Er]   . Sertraline Hcl     REACTION: disorientation  . Tetanus Toxoids      Current Medications, Allergies, Past Medical History, Past Surgical History, Family History, and Social History were reviewed in [employer] record.    Review of Systems       See HPI - all other systems neg except as noted...  The patient complains of weight gain and dyspnea on exertion.  The patient denies anorexia, fever, weight loss, vision loss, decreased hearing, hoarseness, chest pain, syncope, peripheral edema, prolonged cough, headaches, hemoptysis, abdominal pain, melena, hematochezia, severe indigestion/heartburn, hematuria, incontinence, muscle weakness, suspicious skin lesions, transient blindness, difficulty walking, depression, unusual weight change, abnormal bleeding, enlarged lymph nodes, and angioedema.     Objective:   Physical Exam    WD, sl obese, 77 y/o WM in NAD... he has gained 24# to 208#... GENERAL:  Alert & oriented; pleasant & cooperative... HEENT:  /AT, EOM-wnl, PERRLA, Fundi-benign, EACs-clear, TMs-wnl, NOSE-clear, THROAT-clear & wnl. NECK:  Supple w/ fairROM; no JVD; normal carotid impulses w/o bruits; no thyromegaly or nodules palpated; no lymphadenopathy. CHEST:  Clear to P & A; without wheezes/ rales/ or rhonchi. Bilat mild gynecomastia is present, no galactorrhea... HEART:  Regular Rhythm; without murmurs/ rubs/ or gallops. ABDOMEN:  Soft & nontender; normal bowel sounds; no  organomegaly or masses detected. EXT: without deformities, mild arthritic changes; no varicose veins/ venous insuffic/ or edema. NEURO:  CN's intact; motor testing normal; sensory testing normal; gait normal & balance OK. DERM:  No lesions noted; no rash etc...  RADIOLOGY DATA:  Reviewed in the EPIC EMR & discussed w/ the patient...  LABORATORY DATA:  Reviewed in the EPIC EMR & discussed w/ the patient...   Assessment & Plan:    OSA>  He was studied at [facility] & treated w/ CPAP per [facility], we do not have copies of the sleep data...  HBP>  Controlled on Metoprolol, Losartan, Lasix; continue same meds...  CAD>  Followed by Marland Mcalpine for [facility]; continue current meds, incr exercise program, f/u w/ Cards...  PAD & Cerebrovasc Dis>  On ASA/ Plavix- continue same & he will f/u w/ [facility] re Dopplers etc...  HYPERLIPIDEMIA>  On Lip80, Zetia10, FishOil & FLP was good x sl incr TG & decr HDL; we discussed diet + exercise...  GI> GERD, IBS, Polyps>  On Prevacid 15mg /d; last colon was 2007 & f/u due in 2012> he will contact DrStark regarding f/u colon now overdue...  GU>  He sees  DrGrapey for f/u kidney stones and for his PSA screening...  FM> he has carried the dx of fibromyalgia in a male & some of his fatigue may be related...  Anxiety>  He requests refill of his Valium...    Patient's Medications  New Prescriptions   No medications on file  Previous Medications   ASPIRIN 81 MG TABLET    Take 81 mg by mouth daily.     ATORVASTATIN (LIPITOR) 80 MG TABLET    TAKE 1 TABLET AT BEDTIME   FUROSEMIDE (LASIX) 20 MG TABLET    Take 20 mg by mouth. Take 1 tablet & alternate with 2 tablets every other day   LANSOPRAZOLE (PREVACID) 15 MG CAPSULE    Take 15 mg by mouth daily.    Modified Medications   Modified Medication Previous Medication   CLOPIDOGREL (PLAVIX) 75 MG TABLET clopidogrel (PLAVIX) 75 MG tablet      TAKE 1 TABLET BY MOUTH EVERY DAY    Take 75 mg by mouth daily.     DIAZEPAM (VALIUM) 5 MG  TABLET diazepam (VALIUM) 5 MG tablet      TAKE 1 TABLET EVERY 8 HOURS AS NEEDED    TAKE 1 TABLET EVERY 8 HOURS AS NEEDED   LOSARTAN (COZAAR) 50 MG TABLET losartan (COZAAR) 50 MG tablet      Take 50 mg by mouth daily.    Take 1 tablet (50 mg total) by mouth 2 (two) times daily.   MECLIZINE (ANTIVERT) 25 MG TABLET meclizine (ANTIVERT) 25 MG tablet      TAKE ONE TABLET BY MOUTH EVERY 8 HOURS AS NEEDED FOR DIZZINESS    TAKE ONE TABLET BY MOUTH EVERY 8 HOURS AS NEEDED FOR DIZZINESS   METOPROLOL (LOPRESSOR) 50 MG TABLET metoprolol (LOPRESSOR) 50 MG tablet      TAKE 1&1/2 TABLETS BY MOUTH TWICE A DAY    Take 1 1/2  Tablet by mouth two times daily.    ZETIA 10 MG TABLET ezetimibe (ZETIA) 10 MG tablet      TAKE 1 TABLET DAILY.    Take 10 mg by mouth daily.    Discontinued Medications   No medications on file

## 2013-07-09 ENCOUNTER — Ambulatory Visit (INDEPENDENT_AMBULATORY_CARE_PROVIDER_SITE_OTHER): Payer: Medicare Other

## 2013-07-09 DIAGNOSIS — N320 Bladder-neck obstruction: Secondary | ICD-10-CM | POA: Diagnosis not present

## 2013-07-09 DIAGNOSIS — F411 Generalized anxiety disorder: Secondary | ICD-10-CM

## 2013-07-09 DIAGNOSIS — I10 Essential (primary) hypertension: Secondary | ICD-10-CM

## 2013-07-09 DIAGNOSIS — E785 Hyperlipidemia, unspecified: Secondary | ICD-10-CM | POA: Diagnosis not present

## 2013-07-09 DIAGNOSIS — R7309 Other abnormal glucose: Secondary | ICD-10-CM | POA: Diagnosis not present

## 2013-07-09 DIAGNOSIS — D126 Benign neoplasm of colon, unspecified: Secondary | ICD-10-CM | POA: Diagnosis not present

## 2013-07-09 LAB — BASIC METABOLIC PANEL
Calcium: 8.9 mg/dL (ref 8.4–10.5)
GFR: 63.36 mL/min (ref >60.00)
Potassium: 4.7 mEq/L (ref 3.5–5.1)
Sodium: 144 mEq/L (ref 135–145)

## 2013-07-09 LAB — HEPATIC FUNCTION PANEL
AST: 26 U/L (ref 0–37)
Albumin: 3.7 g/dL (ref 3.5–5.2)
Alkaline Phosphatase: 64 U/L (ref 39–117)
Total Bilirubin: 1 mg/dL (ref 0.3–1.2)

## 2013-07-09 LAB — LIPID PANEL
HDL: 31.2 mg/dL — ABNORMAL LOW (ref >39.00)
Total CHOL/HDL Ratio: 4
Triglycerides: 219 mg/dL — ABNORMAL HIGH (ref 0.0–149.0)
VLDL: 43.8 mg/dL — ABNORMAL HIGH (ref 0.0–40.0)

## 2013-07-09 LAB — CBC WITH DIFFERENTIAL/PLATELET
Basophils Absolute: 0 10*3/uL (ref 0.0–0.1)
Eosinophils Relative: 1.9 % (ref 0.0–5.0)
HCT: 40.5 % (ref 39.0–52.0)
Hemoglobin: 13.9 g/dL (ref 13.0–17.0)
Lymphs Abs: 3.4 10*3/uL (ref 0.7–4.0)
MCV: 91 fl (ref 78.0–100.0)
Monocytes Absolute: 0.8 10*3/uL (ref 0.1–1.0)
Monocytes Relative: 9.7 % (ref 3.0–12.0)
Neutro Abs: 4.2 10*3/uL (ref 1.4–7.7)
Platelets: 242 10*3/uL (ref 150.0–400.0)
RDW: 12.9 % (ref 11.5–14.6)

## 2013-07-09 LAB — TSH: TSH: 1.25 u[IU]/mL (ref 0.35–5.50)

## 2013-07-10 LAB — LDL CHOLESTEROL, DIRECT: Direct LDL: 63.1 mg/dL

## 2013-07-12 ENCOUNTER — Other Ambulatory Visit (HOSPITAL_COMMUNITY): Payer: Self-pay | Admitting: Cardiovascular Disease

## 2013-07-14 NOTE — Telephone Encounter (Signed)
Rx was sent to pharmacy electronically. 

## 2013-07-30 ENCOUNTER — Other Ambulatory Visit (HOSPITAL_COMMUNITY): Payer: Self-pay | Admitting: Cardiovascular Disease

## 2013-07-31 NOTE — Telephone Encounter (Signed)
Rx was sent to pharmacy electronically. 

## 2013-08-08 ENCOUNTER — Other Ambulatory Visit: Payer: Self-pay | Admitting: Cardiovascular Disease

## 2013-08-08 NOTE — Telephone Encounter (Signed)
Rx was sent to pharmacy electronically. 

## 2013-08-11 ENCOUNTER — Ambulatory Visit (INDEPENDENT_AMBULATORY_CARE_PROVIDER_SITE_OTHER): Payer: Medicare Other | Admitting: Cardiovascular Disease

## 2013-08-11 ENCOUNTER — Encounter: Payer: Self-pay | Admitting: Cardiovascular Disease

## 2013-08-11 VITALS — BP 124/80 | HR 61 | Ht 68.0 in | Wt 209.0 lb

## 2013-08-11 DIAGNOSIS — E785 Hyperlipidemia, unspecified: Secondary | ICD-10-CM | POA: Diagnosis not present

## 2013-08-11 DIAGNOSIS — I2510 Atherosclerotic heart disease of native coronary artery without angina pectoris: Secondary | ICD-10-CM | POA: Diagnosis not present

## 2013-08-11 DIAGNOSIS — R600 Localized edema: Secondary | ICD-10-CM

## 2013-08-11 DIAGNOSIS — I10 Essential (primary) hypertension: Secondary | ICD-10-CM | POA: Diagnosis not present

## 2013-08-11 DIAGNOSIS — R609 Edema, unspecified: Secondary | ICD-10-CM | POA: Diagnosis not present

## 2013-08-11 NOTE — Progress Notes (Signed)
08/11/2013 Eric [name]   June 26, 1936  [id number]  Primary Physician NADEL,SCOTT Judie Petit, MD Primary Cardiologist: Runell Gess MD [name]   HPI:  The patient is a 77 year old mildly to moderately overweight married Caucasian male father of 4, grandfather to 8 grandchildren who is accompanied by his wife today as usual. I last saw him 8 months ago. He has a history of CAD and PVOD. I stented his LAD and diagonal branch back on July 14, 2003 and subsequent stenting by Dr. Elsie Lincoln July 31, 2003. He had mild bilateral renal artery stenosis at cath with blockages in the 40% range as well as moderate right internal carotid artery stenosis by duplex ultrasound. He was neurologic asymptomatic. His other problems include hypertension and hyperlipidemia. He has obstructive sleep apnea followed by Dr. Tresa Endo. His most recent lab work revealed a total cholesterol of 125, LDL of 58 and HDL of 36. Carotid Dopplers performed in August revealed moderate right ICA stenosis, unchanged from a prior study, however, renal Dopplers did show progression of disease on the left side.I saw him last he is seeing [facility] as well as Huey Bienenstock PA-C. He was complaining of lower extremity edema. His medicines were adjusted and his Norvasc was discontinued. His blood pressure is under better control as edema has resolved. He denies chest pain or shortness of breath.    Current Outpatient Prescriptions  Medication Sig Dispense Refill  . aspirin 81 MG tablet Take 81 mg by mouth daily.        Marland Kitchen atorvastatin (LIPITOR) 80 MG tablet TAKE 1 TABLET AT BEDTIME  30 tablet  10  . clopidogrel (PLAVIX) 75 MG tablet TAKE 1 TABLET BY MOUTH EVERY DAY  30 tablet  11  . diazepam (VALIUM) 5 MG tablet TAKE 1 TABLET EVERY 8 HOURS AS NEEDED  90 tablet  5  . furosemide (LASIX) 20 MG tablet Take 20 mg by mouth. Take 1 tablet & alternate with 2 tablets every other day      . lansoprazole (PREVACID) 15 MG capsule Take  15 mg by mouth daily.        Marland Kitchen losartan (COZAAR) 50 MG tablet Take 50 mg by mouth daily.      . meclizine (ANTIVERT) 25 MG tablet TAKE ONE TABLET BY MOUTH EVERY 8 HOURS AS NEEDED FOR DIZZINESS  90 tablet  5  . metoprolol (LOPRESSOR) 50 MG tablet TAKE 1&1/2 TABLETS BY MOUTH TWICE A DAY  90 tablet  6  . ZETIA 10 MG tablet TAKE 1 TABLET DAILY.  30 tablet  6   No current facility-administered medications for this visit.    Allergies  Allergen Reactions  . Niaspan [Niacin Er]   . Sertraline Hcl     REACTION: disorientation  . Tetanus Toxoids     History   Social History  . Marital Status: Married    Spouse Name: ruth    Number of Children: 4  . Years of Education: N/A   Occupational History  . Retired    Social History Main Topics  . Smoking status: Former Smoker -- 1.00 packs/day for 15 years    Types: Cigarettes    Quit date: 10/02/1964  . Smokeless tobacco: Never Used     Comment: 1 ppd x 15 years  . Alcohol Use: No  . Drug Use: No  . Sexual Activity: Not on file   Other Topics Concern  . Not on file   Social History Narrative  .  No narrative on file     Review of Systems: General: negative for chills, fever, night sweats or weight changes.  Cardiovascular: negative for chest pain, dyspnea on exertion, edema, orthopnea, palpitations, paroxysmal nocturnal dyspnea or shortness of breath Dermatological: negative for rash Respiratory: negative for cough or wheezing Urologic: negative for hematuria Abdominal: negative for nausea, vomiting, diarrhea, bright red blood per rectum, melena, or hematemesis Neurologic: negative for visual changes, syncope, or dizziness All other systems reviewed and are otherwise negative except as noted above.    Blood pressure 124/80, pulse 61, height 5\' 8"  (1.727 m), weight 209 lb (94.802 kg).  General appearance: alert and no distress Neck: no adenopathy, no carotid bruit, no JVD, supple, symmetrical, trachea midline and thyroid not  enlarged, symmetric, no tenderness/mass/nodules Lungs: clear to auscultation bilaterally Heart: regular rate and rhythm, S1, S2 normal, no murmur, click, rub or gallop Extremities: extremities normal, atraumatic, no cyanosis or edema  EKG normal sinus rhythm at 61 without ST or T wave changes  ASSESSMENT AND PLAN:   HYPERLIPIDEMIA On statin therapy along with Zetia with a total cholesterol of 125 symmetrical to level CCXIX HDL of 31.  HYPERTENSION Hypertension well controlled on current medications  Bilateral lower extremity edema Lower chin edema has improved with discontinuation of Norvasc and continued diuretic therapy.  CORONARY ARTERY DISEASE History of coronary disease status post LAD and diabetic stenting 07/14/03 with redo LAD stenting 07/31/03. His last cardiac catheterization performed 01/11/11 revealed patent stents in his mid RCA, LAD and diagonal branches with normal LV function he denies chest pain or shortness of breath.      Runell Gess MD FACP,FACC,FAHA, [facility] 08/11/2013 4:41 PM

## 2013-08-11 NOTE — Assessment & Plan Note (Signed)
History of coronary disease status post LAD and diabetic stenting 07/14/03 with redo LAD stenting 07/31/03. His last cardiac catheterization performed 01/11/11 revealed patent stents in his mid RCA, LAD and diagonal branches with normal LV function he denies chest pain or shortness of breath.

## 2013-08-11 NOTE — Patient Instructions (Signed)
Dr. Allyson Sabal wants you to follow-up in: 6 months with Jones Skene, PA-C.               12 months with Dr Allyson Sabal. You will receive a reminder letter in the mail two months in advance. If you don't receive a letter, please call our office to schedule the follow-up appointment.

## 2013-08-11 NOTE — Assessment & Plan Note (Signed)
On statin therapy along with Zetia with a total cholesterol of 125 symmetrical to level CCXIX HDL of 31.

## 2013-08-11 NOTE — Assessment & Plan Note (Signed)
Lower chin edema has improved with discontinuation of Norvasc and continued diuretic therapy.

## 2013-08-11 NOTE — Assessment & Plan Note (Signed)
Hypertension well controlled on current medications 

## 2013-08-12 ENCOUNTER — Encounter: Payer: Self-pay | Admitting: Cardiovascular Disease

## 2013-09-03 ENCOUNTER — Other Ambulatory Visit: Payer: Self-pay | Admitting: Pulmonary Disease

## 2013-09-03 DIAGNOSIS — D126 Benign neoplasm of colon, unspecified: Secondary | ICD-10-CM

## 2013-11-13 DIAGNOSIS — J018 Other acute sinusitis: Secondary | ICD-10-CM | POA: Diagnosis not present

## 2013-11-19 ENCOUNTER — Encounter (HOSPITAL_COMMUNITY): Payer: Medicare Other

## 2013-12-03 ENCOUNTER — Ambulatory Visit (HOSPITAL_COMMUNITY)
Admission: RE | Admit: 2013-12-03 | Discharge: 2013-12-03 | Disposition: A | Discharge status: Home / Self Care | Payer: Medicare Other | Source: Ambulatory Visit | Attending: Cardiovascular Disease | Admitting: Cardiovascular Disease

## 2013-12-03 DIAGNOSIS — I739 Peripheral vascular disease, unspecified: Secondary | ICD-10-CM | POA: Diagnosis not present

## 2013-12-03 DIAGNOSIS — I10 Essential (primary) hypertension: Secondary | ICD-10-CM | POA: Diagnosis not present

## 2013-12-03 DIAGNOSIS — I701 Atherosclerosis of renal artery: Secondary | ICD-10-CM

## 2013-12-03 NOTE — Progress Notes (Signed)
Renal Duplex Completed. Paige Vanderwoude, BS, RDMS, RVT  

## 2013-12-07 ENCOUNTER — Telehealth: Payer: Self-pay | Admitting: *Deleted

## 2013-12-07 ENCOUNTER — Encounter: Payer: Self-pay | Admitting: *Deleted

## 2013-12-07 DIAGNOSIS — I701 Atherosclerosis of renal artery: Secondary | ICD-10-CM

## 2013-12-07 NOTE — Telephone Encounter (Signed)
Message copied by Chauncy Lean on Sun Dec 07, 2013 10:41 PM ------      Message from: Lorretta Harp      Created: Sun Dec 07, 2013  5:53 PM       No change from prior study. Repeat in 6 months ------

## 2013-12-07 NOTE — Telephone Encounter (Signed)
Order placed for repeat renal dopplers in 6 months

## 2013-12-09 ENCOUNTER — Telehealth: Payer: Self-pay | Admitting: Cardiovascular Disease

## 2013-12-09 NOTE — Telephone Encounter (Signed)
Returned call and pt verified x 2.  Pt informed Eric [name] mailed a letter on Monday and he should be getting it any day now.  Read letter to pt and advised he look out for letter in the mail.  Pt verbalized understanding and agreed w/ plan.

## 2013-12-09 NOTE — Telephone Encounter (Signed)
Would like doppler results from 12-03-13 please.

## 2013-12-11 ENCOUNTER — Telehealth: Payer: Self-pay | Admitting: Cardiovascular Disease

## 2013-12-11 NOTE — Telephone Encounter (Signed)
Pt said he still have not received his test results from his doppler.It was suppose to have been mailed to him.Does he need appt to follow up his results?

## 2013-12-11 NOTE — Telephone Encounter (Signed)
Returned call and pt verified.  Pt reminded this RN spoke w/ him a couple of days ago and read results of letter to him and informed him letter was mailed.  Pt informed the mail may take longer than previously b/c it is picked up and taken to another location.  RN read result letter to pt again and informed him he will receive a call from the [facility] when it gets closer to his 6 mo mark to schedule the repeat test.  Pt verbalized understanding and agreed w/ plan.

## 2013-12-17 ENCOUNTER — Other Ambulatory Visit: Payer: Self-pay | Admitting: *Deleted

## 2013-12-18 ENCOUNTER — Other Ambulatory Visit: Payer: Self-pay | Admitting: *Deleted

## 2013-12-18 ENCOUNTER — Other Ambulatory Visit: Payer: Self-pay

## 2013-12-18 MED ORDER — METOPROLOL TARTRATE 50 MG PO TABS: 75.0000 mg | ORAL_TABLET | Freq: Two times a day (BID) | ORAL | Status: DC

## 2013-12-18 MED ORDER — CLOPIDOGREL BISULFATE 75 MG PO TABS: 75.0000 mg | ORAL_TABLET | Freq: Once | ORAL | Status: DC

## 2013-12-18 NOTE — Telephone Encounter (Signed)
Rx was sent to pharmacy electronically. 

## 2013-12-22 DIAGNOSIS — L821 Other seborrheic keratosis: Secondary | ICD-10-CM | POA: Diagnosis not present

## 2013-12-22 DIAGNOSIS — Z85828 Personal history of other malignant neoplasm of skin: Secondary | ICD-10-CM | POA: Diagnosis not present

## 2013-12-22 DIAGNOSIS — L570 Actinic keratosis: Secondary | ICD-10-CM | POA: Diagnosis not present

## 2014-01-07 ENCOUNTER — Other Ambulatory Visit: Payer: Self-pay | Admitting: Pulmonary Disease

## 2014-01-07 MED ORDER — MECLIZINE HCL 25 MG PO TABS: ORAL_TABLET | ORAL | Status: AC

## 2014-01-15 DIAGNOSIS — Z85828 Personal history of other malignant neoplasm of skin: Secondary | ICD-10-CM | POA: Diagnosis not present

## 2014-01-15 DIAGNOSIS — L723 Sebaceous cyst: Secondary | ICD-10-CM | POA: Diagnosis not present

## 2014-01-16 DIAGNOSIS — H524 Presbyopia: Secondary | ICD-10-CM | POA: Diagnosis not present

## 2014-01-16 DIAGNOSIS — H2510 Age-related nuclear cataract, unspecified eye: Secondary | ICD-10-CM | POA: Diagnosis not present

## 2014-01-25 ENCOUNTER — Other Ambulatory Visit: Payer: Self-pay | Admitting: Pulmonary Disease

## 2014-01-28 ENCOUNTER — Telehealth: Payer: Self-pay | Admitting: Pulmonary Disease

## 2014-01-28 MED ORDER — DIAZEPAM 5 MG PO TABS: ORAL_TABLET | ORAL | Status: AC

## 2014-01-28 NOTE — Telephone Encounter (Signed)
Spoke with pt's wife. They have already been told that SN is no longer doing primary care. Pt is in the process of getting in with a new dr. He is needing refill on Diazepam.  Last OV 07/07/13 Last fill 07/07/13 with 5 refills  SN - please advise on refill.

## 2014-01-28 NOTE — Telephone Encounter (Signed)
Called and spoke with pts wife and she is aware of rx that has been called to the pharmacy.  Nothing further is needed.

## 2014-01-29 ENCOUNTER — Other Ambulatory Visit: Payer: Self-pay | Admitting: *Deleted

## 2014-01-29 MED ORDER — LOSARTAN POTASSIUM 50 MG PO TABS: 50.0000 mg | ORAL_TABLET | Freq: Every day | ORAL | Status: DC

## 2014-01-29 NOTE — Telephone Encounter (Signed)
Rx refill sent to patient pharamcy 

## 2014-02-27 ENCOUNTER — Other Ambulatory Visit: Payer: Self-pay

## 2014-02-27 MED ORDER — EZETIMIBE 10 MG PO TABS: 10.0000 mg | ORAL_TABLET | Freq: Every day | ORAL | Status: DC

## 2014-02-27 NOTE — Telephone Encounter (Signed)
Rx was sent to pharmacy electronically. 

## 2014-03-05 ENCOUNTER — Other Ambulatory Visit: Payer: Self-pay | Admitting: *Deleted

## 2014-03-05 MED ORDER — ATORVASTATIN CALCIUM 80 MG PO TABS: 80.0000 mg | ORAL_TABLET | Freq: Every day | ORAL | Status: DC

## 2014-03-05 NOTE — Telephone Encounter (Signed)
Rx was sent to pharmacy electronically. 

## 2014-04-30 DIAGNOSIS — Z79899 Other long term (current) drug therapy: Secondary | ICD-10-CM | POA: Diagnosis not present

## 2014-04-30 DIAGNOSIS — I6529 Occlusion and stenosis of unspecified carotid artery: Secondary | ICD-10-CM | POA: Diagnosis not present

## 2014-04-30 DIAGNOSIS — N200 Calculus of kidney: Secondary | ICD-10-CM | POA: Diagnosis not present

## 2014-04-30 DIAGNOSIS — Z1331 Encounter for screening for depression: Secondary | ICD-10-CM | POA: Diagnosis not present

## 2014-04-30 DIAGNOSIS — R609 Edema, unspecified: Secondary | ICD-10-CM | POA: Diagnosis not present

## 2014-04-30 DIAGNOSIS — K589 Irritable bowel syndrome without diarrhea: Secondary | ICD-10-CM | POA: Diagnosis not present

## 2014-04-30 DIAGNOSIS — E785 Hyperlipidemia, unspecified: Secondary | ICD-10-CM | POA: Diagnosis not present

## 2014-04-30 DIAGNOSIS — I10 Essential (primary) hypertension: Secondary | ICD-10-CM | POA: Diagnosis not present

## 2014-04-30 DIAGNOSIS — I739 Peripheral vascular disease, unspecified: Secondary | ICD-10-CM | POA: Diagnosis not present

## 2014-05-01 ENCOUNTER — Telehealth (HOSPITAL_COMMUNITY): Payer: Self-pay | Admitting: *Deleted

## 2014-05-06 ENCOUNTER — Ambulatory Visit: Payer: Medicare Other | Admitting: Cardiology

## 2014-05-22 ENCOUNTER — Ambulatory Visit (INDEPENDENT_AMBULATORY_CARE_PROVIDER_SITE_OTHER): Payer: Medicare Other | Admitting: Cardiology

## 2014-05-22 ENCOUNTER — Ambulatory Visit (HOSPITAL_COMMUNITY)
Admission: RE | Admit: 2014-05-22 | Discharge: 2014-05-22 | Disposition: A | Discharge status: Home / Self Care | Payer: Medicare Other | Source: Ambulatory Visit | Attending: Cardiovascular Disease | Admitting: Cardiovascular Disease

## 2014-05-22 ENCOUNTER — Encounter: Payer: Self-pay | Admitting: Cardiology

## 2014-05-22 VITALS — BP 150/80 | HR 64 | Ht 68.0 in | Wt 213.8 lb

## 2014-05-22 DIAGNOSIS — I6529 Occlusion and stenosis of unspecified carotid artery: Secondary | ICD-10-CM

## 2014-05-22 DIAGNOSIS — I209 Angina pectoris, unspecified: Secondary | ICD-10-CM

## 2014-05-22 DIAGNOSIS — I739 Peripheral vascular disease, unspecified: Secondary | ICD-10-CM | POA: Insufficient documentation

## 2014-05-22 DIAGNOSIS — I208 Other forms of angina pectoris: Secondary | ICD-10-CM

## 2014-05-22 MED ORDER — NITROGLYCERIN 0.4 MG SL SUBL: 0.4000 mg | SUBLINGUAL_TABLET | SUBLINGUAL | Status: AC | PRN

## 2014-05-22 NOTE — Progress Notes (Signed)
Carotid Duplex Completed. Nikeisha Klutz, BS, RDMS, RVT  

## 2014-05-22 NOTE — Patient Instructions (Addendum)
CONTINUE WITH CURRENT MEDIATION  MAY USE NTG  SUBLINGUAL IF NEEDED FOR CHEST DISCOMFORT  NEED RENAL DOPPLER DONE PRIOR TO APPOINTMENT WITH DR BERRY.  Your physician wants you to follow-up in [place].  You will receive a reminder letter in the mail two months in advance. If you don't receive a letter, please call our office to schedule the follow-up appointment.

## 2014-05-22 NOTE — Progress Notes (Signed)
05/22/2014 Eric [name]   June 16, 1936  [id number]  Primary Physicia [name], MD Primary Cardiologist: Dr. Gwenlyn Found   HPI:  The patient is a 78 year old male, followed by Dr. Gwenlyn Found. He has a history of known CAD as well as PVOD. Around 2000, he underwent stenting of his RCA. He had a stent to the LAD and diagonal branch back on July 14, 2003, with subsequent stenting by Dr. Melvern Banker July 31, 2003. His last LHC was in 2012 revealing patent stents. He had mild bilateral renal artery stenosis at catheterization and blockages in the 40% range as well as moderate right internal carotid artery stenosis by duplex ultrasound. He also has hypertension, hyperlipidemia, and obstructive sleep apnea which is followed by Dr. Claiborne Billings. Last 2-D echocardiogram was 02/07/2013 and revealed an ejection fraction of 60-65% with normal wall motion. He also had normal diastolic function parameters. Aortic valve area was 1.38 cm. His PVOD has been stable. Dr. Gwenlyn Found follows by renal artery duplex, carotid artery duplex and LEA duplex studies yearly.   Mr. Eric [name] recently established care with a new PCP, Dr. Virgina Jock. At his initial visit he noted occasional chest pain symptoms and Dr. Virgina Jock subsequently referred him back to our office for further evaluation. Based on his history, it appears that his symptoms are consistent with stable angina. He notes occasional mild angina (substernal chest pressure) that occurs in a predictable manner. His symptoms only occur with moderate to heavy physical activity and are immediately relieved with rest. He has not had to use sublingual nitroglycerin as symptoms improve quickly once stressor is removed. Symptoms do not occur at rest and do not occur with ordinary everyday activities. He denies any limitations with ADLs and no chest discomfort or limitations with mild to moderate physical activity. He does not engage in heavy activity on a regular basis. He also denies dyspnea,  lightheadedness, dizziness, syncope/near-syncope, orthopnea, PND and lower extremity edema. He reports full medication compliance. He is a nonsmoker.   Current Outpatient Prescriptions  Medication Sig Dispense Refill  . aspirin 81 MG tablet Take 81 mg by mouth daily.        Marland Kitchen atorvastatin (LIPITOR) 80 MG tablet Take 1 tablet (80 mg total) by mouth daily.  30 tablet  4  . clopidogrel (PLAVIX) 75 MG tablet Take 1 tablet (75 mg total) by mouth once.  90 tablet  3  . diazepam (VALIUM) 5 MG tablet TAKE 1 TABLET EVERY 8 HOURS AS NEEDED  90 tablet  3  . ezetimibe (ZETIA) 10 MG tablet Take 1 tablet (10 mg total) by mouth daily.  30 tablet  6  . furosemide (LASIX) 20 MG tablet Take 20 mg by mouth. Take 1 tablet & alternate with 2 tablets every other day      . lansoprazole (PREVACID) 15 MG capsule Take 15 mg by mouth daily.        Marland Kitchen losartan (COZAAR) 50 MG tablet Take 1 tablet (50 mg total) by mouth daily.  90 tablet  3  . meclizine (ANTIVERT) 25 MG tablet TAKE ONE TABLET BY MOUTH EVERY 8 HOURS AS NEEDED FOR DIZZINESS  90 tablet  2  . metoprolol (LOPRESSOR) 50 MG tablet Take 1.5 tablets (75 mg total) by mouth 2 (two) times daily.  270 tablet  2   No current facility-administered medications for this visit.    Allergies  Allergen Reactions  . Niaspan [Niacin Er]   . Sertraline Hcl     REACTION: disorientation  .  Tetanus Toxoids     History   Social History  . Marital Status: Married    Spouse Name: Eric [name]    Number of Children: 4  . Years of Education: N/A   Occupational History  . Retired    Social History Main Topics  . Smoking status: Former Smoker -- 1.00 packs/day for 15 years    Types: Cigarettes    Quit date: 10/02/1964  . Smokeless tobacco: Never Used     Comment: 1 ppd x 15 years  . Alcohol Use: No  . Drug Use: No  . Sexual Activity: Not on file   Other Topics Concern  . Not on file   Social History Narrative  . No narrative on file     Review of Systems: General:  negative for chills, fever, night sweats or weight changes.  Cardiovascular: negative for chest pain, dyspnea on exertion, edema, orthopnea, palpitations, paroxysmal nocturnal dyspnea or shortness of breath Dermatological: negative for rash Respiratory: negative for cough or wheezing Urologic: negative for hematuria Abdominal: negative for nausea, vomiting, diarrhea, bright red blood per rectum, melena, or hematemesis Neurologic: negative for visual changes, syncope, or dizziness All other systems reviewed and are otherwise negative except as noted above.    Blood pressure 150/80, pulse 64, height 5\' 8"  (1.727 m), weight 213 lb 12.8 oz (96.979 kg).  General appearance: alert, cooperative and no distress Neck: no carotid bruit and no JVD Lungs: clear to auscultation bilaterally Heart: regular rate and rhythm and 2/6 AS murmur Extremities: no LEE Pulses: 2+ and symmetric Skin: warm and dry Neurologic: Grossly normal  EKG NSR. No change compared to prior EKG.   ASSESSMENT AND PLAN:   1. Stable angina: symptoms occur in a predictable manner and only result from moderate to heavy physical activities. Symptoms are immediately relived with rest. He denies any symptoms at rest. No nocturnal angina and no angina/ limitations with ordinary day to day activities. His last LHC was in 2012 revealing patent stents. His EKG is unchanged compared to prior EKG. His BP and HR are both stable. As his angina is stable and EKG is unchanged, I feel no need to pursue any additional w/u at this time. Continue treatment of risk factors: management/control of HTN and HLD. Continue ASA, Plavix, Lipitor plus Zetia, metoprolol and losartan. He was advised to avoid activities that may result in symptoms. It was also recommended that he take sublingual nitroglycerin prophylactically prior to engaging in activities that may potentially cause anginal symptoms. He was instructed to notify our office if he develops unstable  angina. If symptoms progress, occur more frequently, increase in intensity, occur at rest or occur with lower levels of physical activity, then would recommend ischemic evaluation and/or initiation of a long acting oral nitrate. We did discuss the possibility of starting him on a low dose of Imdur today, however he states that his anginal episodes have been very infrequent as he does not engage in heavy physical activity on a regular basis.   PLAN Continue current medications as prescribed. He has been instructed to followup with Dr. Gwenlyn Found in 6 months for routine evaluation. Continue scheduled assessment of renal arteries and carotid arteries by duplex ultrasound as ordered by Dr. Gwenlyn Found.     SIMMONS, BRITTAINYPA-C 05/22/2014 11:56 AM

## 2014-06-01 ENCOUNTER — Encounter: Payer: Self-pay | Admitting: *Deleted

## 2014-06-02 DIAGNOSIS — I209 Angina pectoris, unspecified: Secondary | ICD-10-CM | POA: Diagnosis not present

## 2014-06-02 DIAGNOSIS — R609 Edema, unspecified: Secondary | ICD-10-CM | POA: Diagnosis not present

## 2014-06-02 DIAGNOSIS — I10 Essential (primary) hypertension: Secondary | ICD-10-CM | POA: Diagnosis not present

## 2014-06-02 DIAGNOSIS — G4733 Obstructive sleep apnea (adult) (pediatric): Secondary | ICD-10-CM | POA: Diagnosis not present

## 2014-06-02 DIAGNOSIS — I739 Peripheral vascular disease, unspecified: Secondary | ICD-10-CM | POA: Diagnosis not present

## 2014-06-02 DIAGNOSIS — E119 Type 2 diabetes mellitus without complications: Secondary | ICD-10-CM | POA: Diagnosis not present

## 2014-06-02 DIAGNOSIS — E669 Obesity, unspecified: Secondary | ICD-10-CM | POA: Diagnosis not present

## 2014-06-02 DIAGNOSIS — E785 Hyperlipidemia, unspecified: Secondary | ICD-10-CM | POA: Diagnosis not present

## 2014-06-10 DIAGNOSIS — Z23 Encounter for immunization: Secondary | ICD-10-CM | POA: Diagnosis not present

## 2014-06-23 ENCOUNTER — Ambulatory Visit (HOSPITAL_COMMUNITY)
Admission: RE | Admit: 2014-06-23 | Discharge: 2014-06-23 | Disposition: A | Discharge status: Home / Self Care | Payer: Medicare Other | Source: Ambulatory Visit | Attending: Cardiovascular Disease | Admitting: Cardiovascular Disease

## 2014-06-23 DIAGNOSIS — I10 Essential (primary) hypertension: Secondary | ICD-10-CM

## 2014-06-23 DIAGNOSIS — I701 Atherosclerosis of renal artery: Secondary | ICD-10-CM

## 2014-06-23 NOTE — Progress Notes (Signed)
Renal Duplex Completed. Noell Shular, BS, RDMS, RVT  

## 2014-06-25 ENCOUNTER — Encounter: Payer: Self-pay | Admitting: *Deleted

## 2014-06-25 ENCOUNTER — Telehealth: Payer: Self-pay | Admitting: *Deleted

## 2014-06-25 DIAGNOSIS — I701 Atherosclerosis of renal artery: Secondary | ICD-10-CM

## 2014-06-25 NOTE — Telephone Encounter (Signed)
Message copied by Chauncy Lean on Thu Jun 25, 2014  9:26 PM ------      Message from: Lorretta Harp      Created: Thu Jun 25, 2014 11:09 AM       No change from prior study. Repeat in 6 months ------

## 2014-06-25 NOTE — Telephone Encounter (Signed)
Order placed for repeat renal dopplers in 6 months

## 2014-07-02 ENCOUNTER — Telehealth: Payer: Self-pay | Admitting: Cardiovascular Disease

## 2014-07-02 NOTE — Telephone Encounter (Signed)
Notified Mrs. Sheer that I would be happy to fax a copy of her husbands doppler study to Dr. Virgina Jock.

## 2014-07-02 NOTE — Telephone Encounter (Signed)
Rod Holler called in wanting speak with Curt Bears about faxing the resilts to Mr. Hellstrom's doppler to his PCP, Dr. Virgina Jock. Please call  Thanks

## 2014-07-20 IMAGING — CR DG CHEST 2V
2 series · 2 of 2 positions shown · non-contrast
Comparison: 01/05/2011

CLINICAL DATA: Hypertension, follow-up checkup

EXAM:
CHEST  2 VIEW

[view not recorded (1 of 2)]
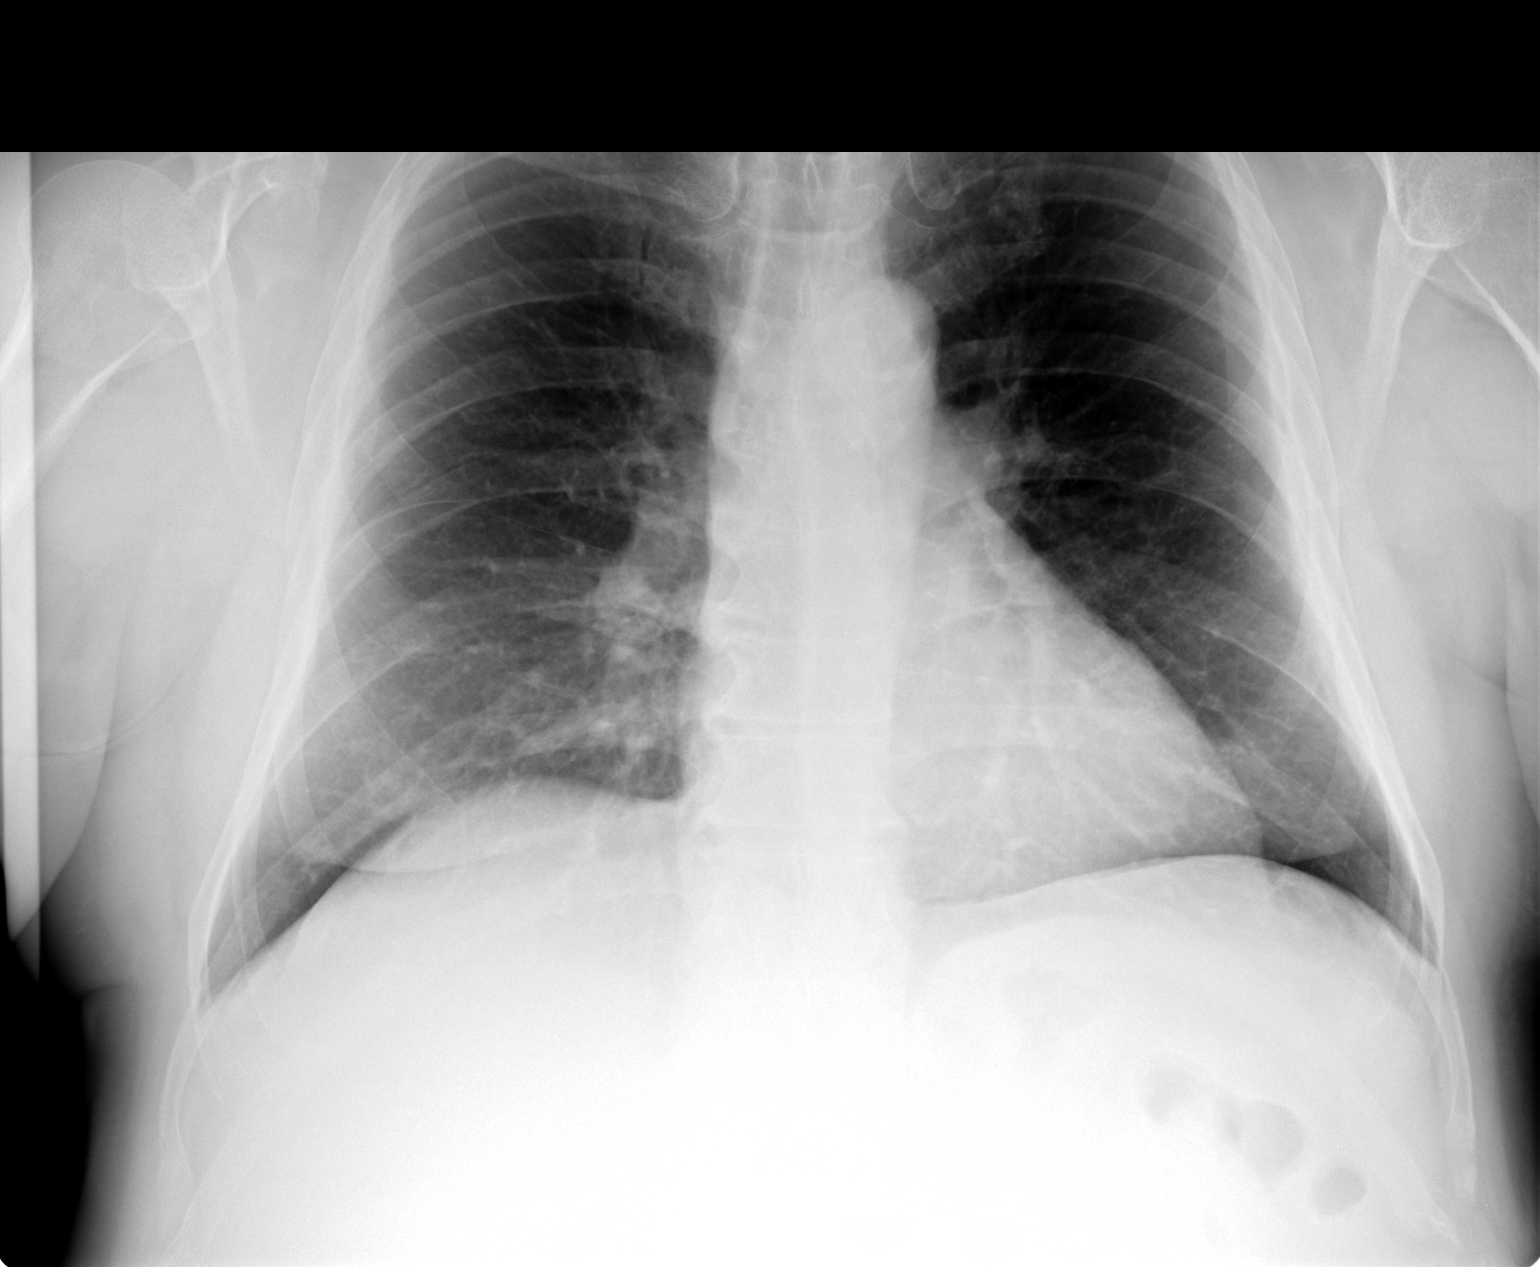

[view not recorded (2 of 2)]
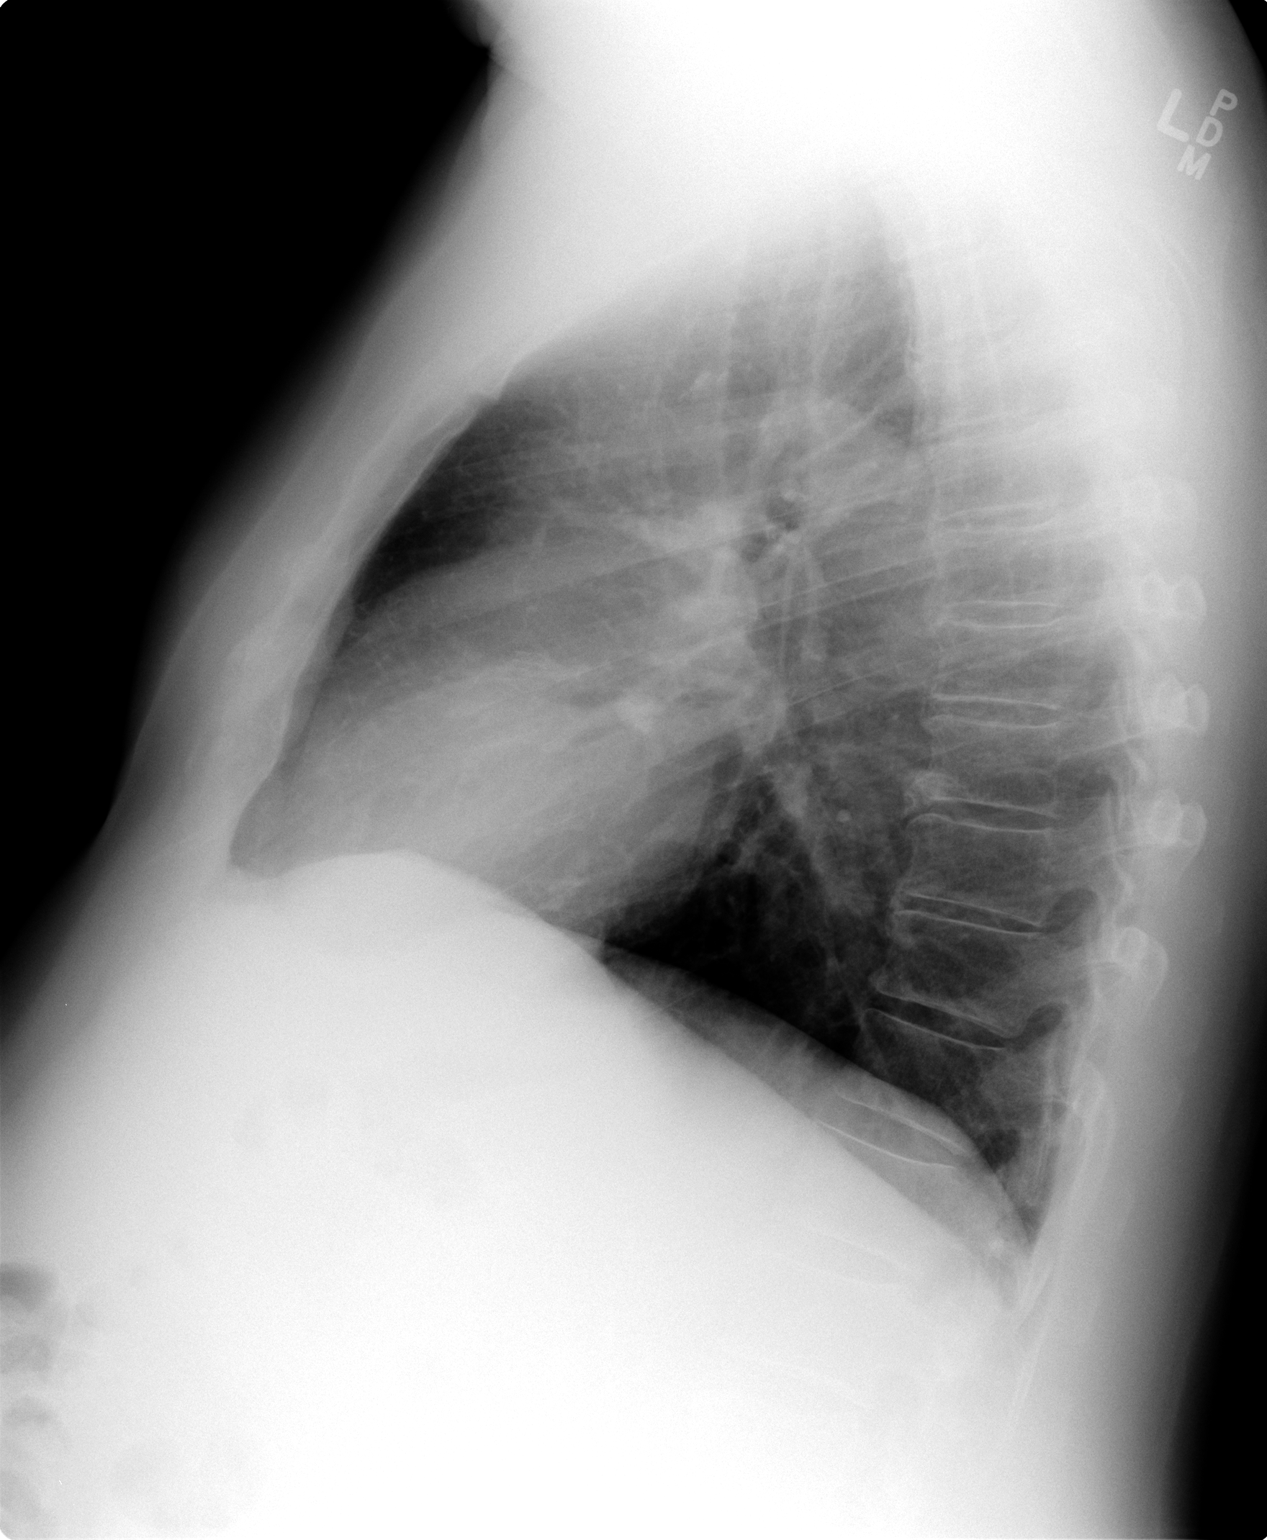

[2 of 2 positions shown; findings below may reference images not displayed]

FINDINGS: The heart size and mediastinal contours are within normal limits.
Both lungs are clear. The visualized skeletal structures are stable.
IMPRESSION: No active cardiopulmonary disease.

## 2014-08-04 ENCOUNTER — Other Ambulatory Visit: Payer: Self-pay | Admitting: Cardiovascular Disease

## 2014-08-04 NOTE — Telephone Encounter (Signed)
Rx was sent to pharmacy electronically. 

## 2014-09-01 ENCOUNTER — Ambulatory Visit: Payer: Medicare Other | Admitting: Cardiovascular Disease

## 2014-10-02 ENCOUNTER — Other Ambulatory Visit: Payer: Self-pay | Admitting: Cardiovascular Disease

## 2014-10-05 NOTE — Telephone Encounter (Signed)
Rx refill sent to patient pharmacy   

## 2014-10-07 ENCOUNTER — Ambulatory Visit (INDEPENDENT_AMBULATORY_CARE_PROVIDER_SITE_OTHER): Payer: Medicare Other | Admitting: Cardiovascular Disease

## 2014-10-07 ENCOUNTER — Encounter: Payer: Self-pay | Admitting: Cardiovascular Disease

## 2014-10-07 VITALS — HR 57 | Ht 68.0 in | Wt 212.6 lb

## 2014-10-07 DIAGNOSIS — I2510 Atherosclerotic heart disease of native coronary artery without angina pectoris: Secondary | ICD-10-CM | POA: Diagnosis not present

## 2014-10-07 DIAGNOSIS — I701 Atherosclerosis of renal artery: Secondary | ICD-10-CM | POA: Diagnosis not present

## 2014-10-07 DIAGNOSIS — I10 Essential (primary) hypertension: Secondary | ICD-10-CM

## 2014-10-07 DIAGNOSIS — E785 Hyperlipidemia, unspecified: Secondary | ICD-10-CM

## 2014-10-07 DIAGNOSIS — Z79899 Other long term (current) drug therapy: Secondary | ICD-10-CM

## 2014-10-07 DIAGNOSIS — I739 Peripheral vascular disease, unspecified: Secondary | ICD-10-CM

## 2014-10-07 MED ORDER — FUROSEMIDE 20 MG PO TABS: 20.0000 mg | ORAL_TABLET | ORAL | Status: DC

## 2014-10-07 NOTE — Patient Instructions (Signed)
Renal Artery Doppler (Sept 2016) - During this test, an ultrasound is used to evaluate blood flow to the kidneys. Allow one hour for this exam. Do not eat after midnight the day before and avoid carbonated beverages. Take your medications as you usually do.  Carotid Doppler (Sept 2016) - This test is an ultrasound of the carotid arteries in your neck. It looks at blood flow through these arteries that supply the brain with blood. Allow one hour for this exam. There are no restrictions or special instructions.  Your physician wants you to follow-up in 1 year with Dr. Gwenlyn Found. You will receive a reminder letter in the mail 2 months in advance. If you do not receive a letter, please call our office to schedule the follow-up appointment.

## 2014-10-07 NOTE — Assessment & Plan Note (Signed)
History of hypertension with blood pressure measured today at 146/84. He is on losartan and metoprolol. Continue current meds at current dosing

## 2014-10-07 NOTE — Assessment & Plan Note (Signed)
History of mild bilateral renal artery stenosis documented angiographically as well as moderate right internal carotid artery stenosis by duplex ultrasound. We'll continue to follow both of these noninvasively

## 2014-10-07 NOTE — Assessment & Plan Note (Signed)
History of CAD status post RCA, LAD and diagonal branch stenting in the past. His last Performed 01/11/11 revealed patent stents and normal LV function. He denies chest pain or shortness of breath.

## 2014-10-07 NOTE — Progress Notes (Signed)
10/07/2014 Eric [name]   21-Feb-1936  [id number]  Primary Physician [name], MD Primary Cardiologist: Lorretta Harp MD Renae Gloss   HPI:  The patient is a 79 year old mildly to moderately overweight married Caucasian male father of 87, grandfather to 8 grandchildren who is accompanied by his wife today as usual. I last saw him 14 months ago. He has a history of CAD and PVOD. I stented his LAD and diagonal branch back on July 14, 2003 and subsequent stenting by Dr. Melvern Banker July 31, 2003. He had mild bilateral renal artery stenosis at cath with blockages in the 40% range as well as moderate right internal carotid artery stenosis by duplex ultrasound. He was neurologic asymptomatic. His other problems include hypertension and hyperlipidemia. He has obstructive sleep apnea followed by Dr. Claiborne Billings. His most recent lab work revealed a total cholesterol of 125, LDL of 58 and HDL of 36. Carotid Dopplers performed in August revealed moderate right ICA stenosis, unchanged from a prior study and renal Dopplers show moderate left renal artery stenosis which are stated stable from prior studies.since I saw him a year ago he remains asymptomatic.   Current Outpatient Prescriptions  Medication Sig Dispense Refill  . aspirin 81 MG tablet Take 81 mg by mouth daily.      Marland Kitchen atorvastatin (LIPITOR) 80 MG tablet TAKE 1 TABLET BY MOUTH EVERY DAY 30 tablet 9  . clopidogrel (PLAVIX) 75 MG tablet Take 1 tablet (75 mg total) by mouth once. 90 tablet 3  . diazepam (VALIUM) 5 MG tablet TAKE 1 TABLET EVERY 8 HOURS AS NEEDED 90 tablet 3  . furosemide (LASIX) 20 MG tablet Take 20 mg by mouth. Take 1 tablet & alternate with 2 tablets every other day    . lansoprazole (PREVACID) 15 MG capsule Take 15 mg by mouth daily.      Marland Kitchen losartan (COZAAR) 50 MG tablet Take 1 tablet (50 mg total) by mouth daily. 90 tablet 3  . meclizine (ANTIVERT) 25 MG tablet TAKE ONE TABLET BY MOUTH EVERY 8 HOURS AS NEEDED FOR  DIZZINESS 90 tablet 2  . metoprolol (LOPRESSOR) 50 MG tablet TAKE 1.5 TABLETS (75 MG TOTAL) BY MOUTH 2 (TWO) TIMES DAILY. 270 tablet 2  . nitroGLYCERIN (NITROSTAT) 0.4 MG SL tablet Place 1 tablet (0.4 mg total) under the tongue every 5 (five) minutes as needed for chest pain. 25 tablet 4  . ZETIA 10 MG tablet TAKE 1 TABLET (10 MG TOTAL) BY MOUTH DAILY. 30 tablet 6   No current facility-administered medications for this visit.    Allergies  Allergen Reactions  . Niaspan [Niacin Er]   . Sertraline Hcl     REACTION: disorientation  . Tetanus Toxoids     History   Social History  . Marital Status: Married    Spouse Name: ruth    Number of Children: 4  . Years of Education: N/A   Occupational History  . Retired    Social History Main Topics  . Smoking status: Former Smoker -- 1.00 packs/day for 15 years    Types: Cigarettes    Quit date: 10/02/1964  . Smokeless tobacco: Never Used     Comment: 1 ppd x 15 years  . Alcohol Use: No  . Drug Use: No  . Sexual Activity: Not on file   Other Topics Concern  . Not on file   Social History Narrative     Review of Systems: General: negative for chills, fever, night sweats  or weight changes.  Cardiovascular: negative for chest pain, dyspnea on exertion, edema, orthopnea, palpitations, paroxysmal nocturnal dyspnea or shortness of breath Dermatological: negative for rash Respiratory: negative for cough or wheezing Urologic: negative for hematuria Abdominal: negative for nausea, vomiting, diarrhea, bright red blood per rectum, melena, or hematemesis Neurologic: negative for visual changes, syncope, or dizziness All other systems reviewed and are otherwise negative except as noted above.    Pulse 57, height 5\' 8"  (1.727 m), weight 212 lb 9.6 oz (96.435 kg).  General appearance: alert and no distress Neck: no adenopathy, no carotid bruit, no JVD, supple, symmetrical, trachea midline and thyroid not enlarged, symmetric, no  tenderness/mass/nodules Lungs: clear to auscultation bilaterally Heart: regular rate and rhythm, S1, S2 normal, no murmur, click, rub or gallop Extremities: extremities normal, atraumatic, no cyanosis or edema  EKG sinus bradycardia 57 without ST or T-wave changes. I personally reviewed this EKG  ASSESSMENT AND PLAN:   Peripheral vascular disease History of mild bilateral renal artery stenosis documented angiographically as well as moderate right internal carotid artery stenosis by duplex ultrasound. We'll continue to follow both of these noninvasively  Essential hypertension History of hypertension with blood pressure measured today at 146/84. He is on losartan and metoprolol. Continue current meds at current dosing  Coronary atherosclerosis History of CAD status post RCA, LAD and diagonal branch stenting in the past. His last Performed 01/11/11 revealed patent stents and normal LV function. He denies chest pain or shortness of breath.      Lorretta Harp MD FACP,FACC,FAHA, [facility] 10/07/2014 3:46 PM

## 2014-10-09 DIAGNOSIS — Z79899 Other long term (current) drug therapy: Secondary | ICD-10-CM | POA: Diagnosis not present

## 2014-10-09 DIAGNOSIS — E785 Hyperlipidemia, unspecified: Secondary | ICD-10-CM | POA: Diagnosis not present

## 2014-10-09 LAB — LIPID PANEL
Cholesterol: 146 mg/dL (ref 0–200)
HDL: 34 mg/dL — ABNORMAL LOW (ref >39)
LDL CALC: 59 mg/dL (ref 0–99)
Total CHOL/HDL Ratio: 4.3 Ratio
Triglycerides: 264 mg/dL — ABNORMAL HIGH (ref <150)
VLDL: 53 mg/dL — ABNORMAL HIGH (ref 0–40)

## 2014-10-09 LAB — HEPATIC FUNCTION PANEL
ALT: 27 U/L (ref 0–53)
AST: 19 U/L (ref 0–37)
Albumin: 4 g/dL (ref 3.5–5.2)
Alkaline Phosphatase: 64 U/L (ref 39–117)
BILIRUBIN INDIRECT: 1.1 mg/dL (ref 0.2–1.2)
Bilirubin, Direct: 0.2 mg/dL (ref 0.0–0.3)
Total Bilirubin: 1.3 mg/dL — ABNORMAL HIGH (ref 0.2–1.2)
Total Protein: 7.1 g/dL (ref 6.0–8.3)

## 2014-10-13 ENCOUNTER — Encounter: Payer: Self-pay | Admitting: Gastroenterology

## 2014-10-13 ENCOUNTER — Telehealth: Payer: Self-pay | Admitting: *Deleted

## 2014-10-13 DIAGNOSIS — E785 Hyperlipidemia, unspecified: Secondary | ICD-10-CM

## 2014-10-13 DIAGNOSIS — Z79899 Other long term (current) drug therapy: Secondary | ICD-10-CM

## 2014-10-13 DIAGNOSIS — R17 Unspecified jaundice: Secondary | ICD-10-CM

## 2014-10-13 NOTE — Telephone Encounter (Signed)
Left message on machine to call back. Pt needs to have labs redrawn in 8 weeks.

## 2014-10-13 NOTE — Telephone Encounter (Signed)
Lab slips to be mailed

## 2014-10-13 NOTE — Telephone Encounter (Signed)
-----   Message from Lorretta Harp, MD sent at 10/11/2014  5:03 PM EST ----- Excellent FLP and LFTs except for mildly elevated T Bili (has been higher in past ) and elevated trig. Repeat fasting in 8 weeks

## 2014-10-14 NOTE — Telephone Encounter (Signed)
Pt is returning [place]'s call

## 2014-10-15 NOTE — Telephone Encounter (Signed)
I spoke with patient and reviewed lab results.  Lab slip mailed for recheck.

## 2014-10-15 NOTE — Telephone Encounter (Signed)
Pt says he is returning Eric [name] call.

## 2014-11-06 ENCOUNTER — Telehealth: Payer: Self-pay | Admitting: Cardiovascular Disease

## 2014-11-06 NOTE — Telephone Encounter (Signed)
I spoke with Eric [name] and faxed blood work to Dr Virgina Jock at her request

## 2014-11-06 NOTE — Telephone Encounter (Signed)
Ms. Eric [name] called in wanting for Eric [name] to call her because Eric [name] PCP would like a copy of his last labs done here. Please f/u  Thanks

## 2014-11-30 DIAGNOSIS — I10 Essential (primary) hypertension: Secondary | ICD-10-CM | POA: Diagnosis not present

## 2014-11-30 DIAGNOSIS — E119 Type 2 diabetes mellitus without complications: Secondary | ICD-10-CM | POA: Diagnosis not present

## 2014-11-30 DIAGNOSIS — Z008 Encounter for other general examination: Secondary | ICD-10-CM | POA: Diagnosis not present

## 2014-11-30 DIAGNOSIS — E785 Hyperlipidemia, unspecified: Secondary | ICD-10-CM | POA: Diagnosis not present

## 2014-11-30 DIAGNOSIS — I2510 Atherosclerotic heart disease of native coronary artery without angina pectoris: Secondary | ICD-10-CM | POA: Diagnosis not present

## 2014-11-30 DIAGNOSIS — Z125 Encounter for screening for malignant neoplasm of prostate: Secondary | ICD-10-CM | POA: Diagnosis not present

## 2014-12-07 DIAGNOSIS — I129 Hypertensive chronic kidney disease with stage 1 through stage 4 chronic kidney disease, or unspecified chronic kidney disease: Secondary | ICD-10-CM | POA: Diagnosis not present

## 2014-12-07 DIAGNOSIS — R600 Localized edema: Secondary | ICD-10-CM | POA: Diagnosis not present

## 2014-12-07 DIAGNOSIS — N200 Calculus of kidney: Secondary | ICD-10-CM | POA: Diagnosis not present

## 2014-12-07 DIAGNOSIS — N183 Chronic kidney disease, stage 3 (moderate): Secondary | ICD-10-CM | POA: Diagnosis not present

## 2014-12-07 DIAGNOSIS — I209 Angina pectoris, unspecified: Secondary | ICD-10-CM | POA: Diagnosis not present

## 2014-12-07 DIAGNOSIS — K635 Polyp of colon: Secondary | ICD-10-CM | POA: Diagnosis not present

## 2014-12-07 DIAGNOSIS — K589 Irritable bowel syndrome without diarrhea: Secondary | ICD-10-CM | POA: Diagnosis not present

## 2014-12-07 DIAGNOSIS — Z23 Encounter for immunization: Secondary | ICD-10-CM | POA: Diagnosis not present

## 2014-12-07 DIAGNOSIS — Z6832 Body mass index (BMI) 32.0-32.9, adult: Secondary | ICD-10-CM | POA: Diagnosis not present

## 2014-12-07 DIAGNOSIS — G4733 Obstructive sleep apnea (adult) (pediatric): Secondary | ICD-10-CM | POA: Diagnosis not present

## 2014-12-07 DIAGNOSIS — I739 Peripheral vascular disease, unspecified: Secondary | ICD-10-CM | POA: Diagnosis not present

## 2014-12-07 DIAGNOSIS — Z0000 Encounter for general adult medical examination without abnormal findings: Secondary | ICD-10-CM | POA: Diagnosis not present

## 2014-12-08 DIAGNOSIS — Z1212 Encounter for screening for malignant neoplasm of rectum: Secondary | ICD-10-CM | POA: Diagnosis not present

## 2014-12-09 ENCOUNTER — Other Ambulatory Visit: Payer: Self-pay | Admitting: Cardiovascular Disease

## 2014-12-09 NOTE — Telephone Encounter (Signed)
Rx(s) sent to pharmacy electronically.  

## 2014-12-10 ENCOUNTER — Other Ambulatory Visit: Payer: Self-pay | Admitting: Cardiovascular Disease

## 2014-12-11 NOTE — Telephone Encounter (Signed)
Rx has been sent to the pharmacy electronically. ° °

## 2014-12-17 ENCOUNTER — Ambulatory Visit (HOSPITAL_COMMUNITY)
Admission: RE | Admit: 2014-12-17 | Discharge: 2014-12-17 | Disposition: A | Discharge status: Home / Self Care | Payer: Medicare Other | Source: Ambulatory Visit | Attending: Cardiovascular Disease | Admitting: Cardiovascular Disease

## 2014-12-17 DIAGNOSIS — I701 Atherosclerosis of renal artery: Secondary | ICD-10-CM | POA: Diagnosis not present

## 2014-12-17 NOTE — Progress Notes (Signed)
Renal Duplex Completed. Maral Lampe, BS, RDMS, RVT  

## 2014-12-23 DIAGNOSIS — Z85828 Personal history of other malignant neoplasm of skin: Secondary | ICD-10-CM | POA: Diagnosis not present

## 2014-12-23 DIAGNOSIS — D1801 Hemangioma of skin and subcutaneous tissue: Secondary | ICD-10-CM | POA: Diagnosis not present

## 2014-12-23 DIAGNOSIS — L570 Actinic keratosis: Secondary | ICD-10-CM | POA: Diagnosis not present

## 2014-12-23 DIAGNOSIS — D485 Neoplasm of uncertain behavior of skin: Secondary | ICD-10-CM | POA: Diagnosis not present

## 2014-12-23 DIAGNOSIS — L821 Other seborrheic keratosis: Secondary | ICD-10-CM | POA: Diagnosis not present

## 2014-12-25 ENCOUNTER — Encounter: Payer: Self-pay | Admitting: *Deleted

## 2015-01-19 DIAGNOSIS — H25013 Cortical age-related cataract, bilateral: Secondary | ICD-10-CM | POA: Diagnosis not present

## 2015-01-19 DIAGNOSIS — H524 Presbyopia: Secondary | ICD-10-CM | POA: Diagnosis not present

## 2015-02-02 ENCOUNTER — Telehealth: Payer: Self-pay | Admitting: Cardiovascular Disease

## 2015-02-02 NOTE — Telephone Encounter (Signed)
Pt says he wanted to talk to [facility] not give any details.

## 2015-02-02 NOTE — Telephone Encounter (Signed)
Returned call to patient.Stated he needed to talk with Curt Bears.Viviann Spare out of office this afternoon.Offered to help patient he stated he just wanted to talk with Curt Bears.Advised will send message to [place].

## 2015-02-03 NOTE — Telephone Encounter (Signed)
Ok .. Generic sidenafil (Revatio) 20 mg tabs - take 2-3 tabs prior to sexual activity.  Can get them for a reduced cost through [facility] in [place].  Dr. Lemmie Evens

## 2015-02-03 NOTE — Telephone Encounter (Signed)
I spoke with patient.  He wants a Careers information officer for Viagra. He used to take it many years ago, but hasn't taken any recently. He would like to renew the RX.  Eric [name] is not taking an oral nitrate.  I will defer to DOD for RX.

## 2015-02-04 MED ORDER — SILDENAFIL CITRATE 20 MG PO TABS: ORAL_TABLET | ORAL | Status: DC

## 2015-02-04 NOTE — Telephone Encounter (Signed)
I spoke with patient.  He will try the generic but not [facility] drug at this time. [place] sent in.

## 2015-05-04 ENCOUNTER — Other Ambulatory Visit: Payer: Self-pay | Admitting: Cardiovascular Disease

## 2015-05-04 NOTE — Telephone Encounter (Signed)
Rx(s) sent to pharmacy electronically.  

## 2015-06-08 DIAGNOSIS — N183 Chronic kidney disease, stage 3 (moderate): Secondary | ICD-10-CM | POA: Diagnosis not present

## 2015-06-08 DIAGNOSIS — E785 Hyperlipidemia, unspecified: Secondary | ICD-10-CM | POA: Diagnosis not present

## 2015-06-08 DIAGNOSIS — G4733 Obstructive sleep apnea (adult) (pediatric): Secondary | ICD-10-CM | POA: Diagnosis not present

## 2015-06-08 DIAGNOSIS — I10 Essential (primary) hypertension: Secondary | ICD-10-CM | POA: Diagnosis not present

## 2015-06-08 DIAGNOSIS — I701 Atherosclerosis of renal artery: Secondary | ICD-10-CM | POA: Diagnosis not present

## 2015-06-08 DIAGNOSIS — I129 Hypertensive chronic kidney disease with stage 1 through stage 4 chronic kidney disease, or unspecified chronic kidney disease: Secondary | ICD-10-CM | POA: Diagnosis not present

## 2015-06-08 DIAGNOSIS — Z23 Encounter for immunization: Secondary | ICD-10-CM | POA: Diagnosis not present

## 2015-06-08 DIAGNOSIS — I2510 Atherosclerotic heart disease of native coronary artery without angina pectoris: Secondary | ICD-10-CM | POA: Diagnosis not present

## 2015-06-08 DIAGNOSIS — E669 Obesity, unspecified: Secondary | ICD-10-CM | POA: Diagnosis not present

## 2015-06-08 DIAGNOSIS — E119 Type 2 diabetes mellitus without complications: Secondary | ICD-10-CM | POA: Diagnosis not present

## 2015-06-08 DIAGNOSIS — I350 Nonrheumatic aortic (valve) stenosis: Secondary | ICD-10-CM | POA: Diagnosis not present

## 2015-06-08 DIAGNOSIS — I272 Other secondary pulmonary hypertension: Secondary | ICD-10-CM | POA: Diagnosis not present

## 2015-06-10 ENCOUNTER — Encounter: Payer: Self-pay | Admitting: Cardiovascular Disease

## 2015-06-11 ENCOUNTER — Encounter: Payer: Self-pay | Admitting: *Deleted

## 2015-06-12 ENCOUNTER — Other Ambulatory Visit: Payer: Self-pay | Admitting: Cardiovascular Disease

## 2015-06-14 NOTE — Telephone Encounter (Signed)
Rx request sent to pharmacy.  

## 2015-06-23 ENCOUNTER — Other Ambulatory Visit: Payer: Self-pay | Admitting: Cardiovascular Disease

## 2015-06-23 DIAGNOSIS — I739 Peripheral vascular disease, unspecified: Secondary | ICD-10-CM

## 2015-06-23 DIAGNOSIS — I6523 Occlusion and stenosis of bilateral carotid arteries: Secondary | ICD-10-CM

## 2015-06-26 DIAGNOSIS — Z23 Encounter for immunization: Secondary | ICD-10-CM | POA: Diagnosis not present

## 2015-06-29 ENCOUNTER — Other Ambulatory Visit: Payer: Self-pay | Admitting: Cardiovascular Disease

## 2015-06-29 DIAGNOSIS — I6523 Occlusion and stenosis of bilateral carotid arteries: Secondary | ICD-10-CM

## 2015-06-29 DIAGNOSIS — I701 Atherosclerosis of renal artery: Secondary | ICD-10-CM

## 2015-06-30 ENCOUNTER — Ambulatory Visit (HOSPITAL_COMMUNITY)
Admission: RE | Admit: 2015-06-30 | Discharge: 2015-06-30 | Disposition: A | Discharge status: Home / Self Care | Payer: Medicare Other | Source: Ambulatory Visit | Attending: Cardiovascular Disease | Admitting: Cardiovascular Disease

## 2015-06-30 ENCOUNTER — Ambulatory Visit (HOSPITAL_BASED_OUTPATIENT_CLINIC_OR_DEPARTMENT_OTHER)
Admission: RE | Admit: 2015-06-30 | Discharge: 2015-06-30 | Disposition: A | Discharge status: Home / Self Care | Payer: Medicare Other | Source: Ambulatory Visit | Attending: Cardiovascular Disease | Admitting: Cardiovascular Disease

## 2015-06-30 DIAGNOSIS — I6523 Occlusion and stenosis of bilateral carotid arteries: Secondary | ICD-10-CM | POA: Insufficient documentation

## 2015-06-30 DIAGNOSIS — I739 Peripheral vascular disease, unspecified: Secondary | ICD-10-CM | POA: Insufficient documentation

## 2015-06-30 DIAGNOSIS — I10 Essential (primary) hypertension: Secondary | ICD-10-CM | POA: Diagnosis not present

## 2015-06-30 DIAGNOSIS — I701 Atherosclerosis of renal artery: Secondary | ICD-10-CM | POA: Diagnosis not present

## 2015-06-30 DIAGNOSIS — E785 Hyperlipidemia, unspecified: Secondary | ICD-10-CM | POA: Diagnosis not present

## 2015-06-30 DIAGNOSIS — I2510 Atherosclerotic heart disease of native coronary artery without angina pectoris: Secondary | ICD-10-CM | POA: Diagnosis not present

## 2015-06-30 DIAGNOSIS — Z87891 Personal history of nicotine dependence: Secondary | ICD-10-CM | POA: Diagnosis not present

## 2015-07-02 ENCOUNTER — Other Ambulatory Visit: Payer: Self-pay | Admitting: Cardiovascular Disease

## 2015-07-05 NOTE — Telephone Encounter (Signed)
Rx(s) sent to pharmacy electronically.  

## 2015-09-27 ENCOUNTER — Other Ambulatory Visit: Payer: Self-pay | Admitting: Cardiovascular Disease

## 2015-09-28 NOTE — Telephone Encounter (Signed)
Rx request sent to pharmacy.  

## 2015-10-06 ENCOUNTER — Encounter: Payer: Self-pay | Admitting: Cardiovascular Disease

## 2015-10-06 ENCOUNTER — Ambulatory Visit (INDEPENDENT_AMBULATORY_CARE_PROVIDER_SITE_OTHER): Payer: Medicare Other | Admitting: Cardiovascular Disease

## 2015-10-06 VITALS — BP 142/64 | HR 49 | Ht 67.0 in | Wt 188.0 lb

## 2015-10-06 DIAGNOSIS — I2510 Atherosclerotic heart disease of native coronary artery without angina pectoris: Secondary | ICD-10-CM | POA: Diagnosis not present

## 2015-10-06 DIAGNOSIS — I10 Essential (primary) hypertension: Secondary | ICD-10-CM

## 2015-10-06 DIAGNOSIS — I350 Nonrheumatic aortic (valve) stenosis: Secondary | ICD-10-CM

## 2015-10-06 DIAGNOSIS — I6523 Occlusion and stenosis of bilateral carotid arteries: Secondary | ICD-10-CM | POA: Diagnosis not present

## 2015-10-06 DIAGNOSIS — E785 Hyperlipidemia, unspecified: Secondary | ICD-10-CM

## 2015-10-06 DIAGNOSIS — I739 Peripheral vascular disease, unspecified: Secondary | ICD-10-CM

## 2015-10-06 NOTE — Assessment & Plan Note (Signed)
History of peripheral arterial disease with bilateral renal artery stenosis as well as moderate right internal carotid artery stenosis.Marland Kitchen His most recent Dopplers performed 06/22/15 revealed moderate progression of his left renal artery stenosis. In addition, his carotid Dopplers showed stable moderate right ICA stenosis.

## 2015-10-06 NOTE — Progress Notes (Signed)
10/06/2015 Eric [name]   Feb 14, 1936  [id number]  Primary Physician [name], MD Primary Cardiologist: Lorretta Harp MD Renae Gloss   HPI:  The patient is a 80 year old mildly to moderately overweight married Caucasian male father of 63, grandfather to 8 grandchildren who is accompanied by his wife today as usual. I last saw him 12 months ago. He has a history of CAD and PVOD. I stented his LAD and diagonal branch back on July 14, 2003 and subsequent stenting by Dr. Melvern Banker July 31, 2003. He had mild bilateral renal artery stenosis at cath with blockages in the 40% range as well as moderate right internal carotid artery stenosis by duplex ultrasound. He was neurologic asymptomatic. His other problems include hypertension and hyperlipidemia. He has obstructive sleep apnea followed by Dr. Claiborne Billings. His most recent lab work revealed a total cholesterol of 125, LDL of 58 and HDL of 36. Carotid Dopplers performed in September 2016 revealed moderate right ICA stenosis, unchanged from a prior study and renal Dopplers show moderate left renal artery stenosis which are stated stable from prior studies. Since I saw him a year ago he remains asymptomatic.   Current Outpatient Prescriptions  Medication Sig Dispense Refill  . aspirin 81 MG tablet Take 81 mg by mouth daily.      Marland Kitchen atorvastatin (LIPITOR) 80 MG tablet TAKE 1 TABLET BY MOUTH EVERY DAY 30 tablet 3  . clopidogrel (PLAVIX) 75 MG tablet TAKE 1 TABLET (75 MG TOTAL) BY MOUTH ONCE. 90 tablet 3  . diazepam (VALIUM) 5 MG tablet TAKE 1 TABLET EVERY 8 HOURS AS NEEDED 90 tablet 3  . furosemide (LASIX) 20 MG tablet Take 1 tablet (20 mg total) by mouth every other day. Take 1 tablet & alternate with 2 tablets every other day 30 tablet 6  . lansoprazole (PREVACID) 15 MG capsule Take 15 mg by mouth daily.      Marland Kitchen losartan (COZAAR) 50 MG tablet Take 1 tablet (50 mg total) by mouth daily. 90 tablet 3  . meclizine (ANTIVERT) 25 MG tablet  TAKE ONE TABLET BY MOUTH EVERY 8 HOURS AS NEEDED FOR DIZZINESS 90 tablet 2  . metoprolol (LOPRESSOR) 50 MG tablet TAKE 1.5 TABLETS (75 MG TOTAL) BY MOUTH 2 (TWO) TIMES DAILY. 270 tablet 0  . nitroGLYCERIN (NITROSTAT) 0.4 MG SL tablet Place 1 tablet (0.4 mg total) under the tongue every 5 (five) minutes as needed for chest pain. 25 tablet 4  . ZETIA 10 MG tablet TAKE 1 TABLET (10 MG TOTAL) BY MOUTH DAILY. 30 tablet 11   No current facility-administered medications for this visit.    Allergies  Allergen Reactions  . Niaspan [Niacin Er]   . Sertraline Hcl     REACTION: disorientation  . Tetanus Toxoids     Social History   Social History  . Marital Status: Married    Spouse Name: ruth  . Number of Children: 4  . Years of Education: N/A   Occupational History  . Retired    Social History Main Topics  . Smoking status: Former Smoker -- 1.00 packs/day for 15 years    Types: Cigarettes    Quit date: 10/02/1964  . Smokeless tobacco: Never Used     Comment: 1 ppd x 15 years  . Alcohol Use: No  . Drug Use: No  . Sexual Activity: Not on file   Other Topics Concern  . Not on file   Social History Narrative     Review  of Systems: General: negative for chills, fever, night sweats or weight changes.  Cardiovascular: negative for chest pain, dyspnea on exertion, edema, orthopnea, palpitations, paroxysmal nocturnal dyspnea or shortness of breath Dermatological: negative for rash Respiratory: negative for cough or wheezing Urologic: negative for hematuria Abdominal: negative for nausea, vomiting, diarrhea, bright red blood per rectum, melena, or hematemesis Neurologic: negative for visual changes, syncope, or dizziness All other systems reviewed and are otherwise negative except as noted above.    Blood pressure 142/64, pulse 49, height 5\' 7"  (1.702 m), weight 188 lb (85.276 kg).  General appearance: alert and no distress Neck: no adenopathy, no carotid bruit, no JVD, supple,  symmetrical, trachea midline and thyroid not enlarged, symmetric, no tenderness/mass/nodules Lungs: clear to auscultation bilaterally Heart: regular rate and rhythm, S1, S2 normal, no murmur, click, rub or gallop Extremities: extremities normal, atraumatic, no cyanosis or edema  EKG sinus bradycardia 49 with ST-T wave changes. I personally reviewed this EKG  ASSESSMENT AND PLAN:   Peripheral vascular disease History of peripheral arterial disease with bilateral renal artery stenosis as well as moderate right internal carotid artery stenosis.Marland Kitchen His most recent Dopplers performed 06/22/15 revealed moderate progression of his left renal artery stenosis. In addition, his carotid Dopplers showed stable moderate right ICA stenosis.  HYPERLIPIDEMIA History of hyperlipidemia on statin therapy followed by his PCP  Essential hypertension History of hypertension with blood pressure measured at 142/64. He is on losartan and Lopressor. Continue current meds at current dosing  Coronary atherosclerosis History of CAD status post LAD and diagonal stenting by myself 07/14/03 with subsequent stenting by Dr. Melvern Banker 07/31/03. He denies chest pain or shortness of breath.      Lorretta Harp MD FACP,FACC,FAHA, [facility] 10/06/2015 3:02 PM

## 2015-10-06 NOTE — Assessment & Plan Note (Signed)
History of CAD status post LAD and diagonal stenting by myself 07/14/03 with subsequent stenting by Dr. Melvern Banker 07/31/03. He denies chest pain or shortness of breath.

## 2015-10-06 NOTE — Assessment & Plan Note (Signed)
History of hyperlipidemia on statin therapy followed by his PCP 

## 2015-10-06 NOTE — Assessment & Plan Note (Signed)
History of hypertension with blood pressure measured at 142/64. He is on losartan and Lopressor. Continue current meds at current dosing

## 2015-10-06 NOTE — Patient Instructions (Signed)
Medication Instructions:  Your physician recommends that you continue on your current medications as directed. Please refer to the Current Medication list given to you today.   Labwork: none  Testing/Procedures: Your physician has requested that you have an echocardiogram. Echocardiography is a painless test that uses sound waves to create images of your heart. It provides your doctor with information about the size and shape of your heart and how well your heart's chambers and valves are working. This procedure takes approximately one hour. There are no restrictions for this procedure.   Your physician has requested that you have a renal artery duplex. During this test, an ultrasound is used to evaluate blood flow to the kidneys. Allow one hour for this exam. Do not eat after midnight the day before and avoid carbonated beverages. Take your medications as you usually do. SCHEDULE SEPT. 2017  Your physician has requested that you have a carotid duplex. This test is an ultrasound of the carotid arteries in your neck. It looks at blood flow through these arteries that supply the brain with blood. Allow one hour for this exam. There are no restrictions or special instructions. SCHEDULE SEPT. 2017    Follow-Up: Your physician wants you to follow-up in: 12 months with Dr. Gwenlyn Found. You will receive a reminder letter in the mail two months in advance. If you don't receive a letter, please call our office to schedule the follow-up appointment.   Any Other Special Instructions Will Be Listed Below (If Applicable).     If you need a refill on your cardiac medications before your next appointment, please call your pharmacy.

## 2015-10-14 ENCOUNTER — Other Ambulatory Visit: Payer: Self-pay

## 2015-10-14 ENCOUNTER — Ambulatory Visit (HOSPITAL_COMMUNITY): Payer: Medicare Other | Attending: Cardiology

## 2015-10-14 DIAGNOSIS — I10 Essential (primary) hypertension: Secondary | ICD-10-CM

## 2015-10-14 DIAGNOSIS — I350 Nonrheumatic aortic (valve) stenosis: Secondary | ICD-10-CM | POA: Insufficient documentation

## 2015-10-14 DIAGNOSIS — I6523 Occlusion and stenosis of bilateral carotid arteries: Secondary | ICD-10-CM

## 2015-10-14 DIAGNOSIS — I5189 Other ill-defined heart diseases: Secondary | ICD-10-CM | POA: Insufficient documentation

## 2015-10-14 DIAGNOSIS — I340 Nonrheumatic mitral (valve) insufficiency: Secondary | ICD-10-CM | POA: Insufficient documentation

## 2015-10-14 DIAGNOSIS — Z87891 Personal history of nicotine dependence: Secondary | ICD-10-CM | POA: Insufficient documentation

## 2015-10-14 DIAGNOSIS — I071 Rheumatic tricuspid insufficiency: Secondary | ICD-10-CM | POA: Diagnosis not present

## 2015-10-14 DIAGNOSIS — I517 Cardiomegaly: Secondary | ICD-10-CM | POA: Insufficient documentation

## 2015-10-14 DIAGNOSIS — E785 Hyperlipidemia, unspecified: Secondary | ICD-10-CM | POA: Diagnosis not present

## 2015-10-26 ENCOUNTER — Other Ambulatory Visit: Payer: Self-pay

## 2015-10-26 ENCOUNTER — Telehealth: Payer: Self-pay | Admitting: Cardiovascular Disease

## 2015-10-26 DIAGNOSIS — I350 Nonrheumatic aortic (valve) stenosis: Secondary | ICD-10-CM

## 2015-10-26 NOTE — Telephone Encounter (Signed)
Pt given results  

## 2015-10-26 NOTE — Telephone Encounter (Signed)
Pt would like jis echo results from 10-14-15 please.

## 2015-12-06 DIAGNOSIS — E119 Type 2 diabetes mellitus without complications: Secondary | ICD-10-CM | POA: Diagnosis not present

## 2015-12-06 DIAGNOSIS — Z125 Encounter for screening for malignant neoplasm of prostate: Secondary | ICD-10-CM | POA: Diagnosis not present

## 2015-12-06 DIAGNOSIS — I10 Essential (primary) hypertension: Secondary | ICD-10-CM | POA: Diagnosis not present

## 2015-12-06 DIAGNOSIS — E784 Other hyperlipidemia: Secondary | ICD-10-CM | POA: Diagnosis not present

## 2015-12-23 ENCOUNTER — Inpatient Hospital Stay (HOSPITAL_COMMUNITY): Admission: RE | Admit: 2015-12-23 | Payer: Medicare Other | Source: Ambulatory Visit

## 2016-01-02 ENCOUNTER — Other Ambulatory Visit: Payer: Self-pay | Admitting: Cardiovascular Disease

## 2016-01-05 ENCOUNTER — Ambulatory Visit (HOSPITAL_COMMUNITY)
Admission: RE | Admit: 2016-01-05 | Discharge: 2016-01-05 | Disposition: A | Discharge status: Home / Self Care | Payer: Medicare Other | Source: Ambulatory Visit | Attending: Cardiovascular Disease | Admitting: Cardiovascular Disease

## 2016-01-05 ENCOUNTER — Telehealth: Payer: Self-pay

## 2016-01-05 DIAGNOSIS — I10 Essential (primary) hypertension: Secondary | ICD-10-CM | POA: Insufficient documentation

## 2016-01-05 DIAGNOSIS — I350 Nonrheumatic aortic (valve) stenosis: Secondary | ICD-10-CM | POA: Diagnosis not present

## 2016-01-05 DIAGNOSIS — Z87891 Personal history of nicotine dependence: Secondary | ICD-10-CM | POA: Diagnosis not present

## 2016-01-05 DIAGNOSIS — N1330 Unspecified hydronephrosis: Secondary | ICD-10-CM | POA: Insufficient documentation

## 2016-01-05 DIAGNOSIS — I6523 Occlusion and stenosis of bilateral carotid arteries: Secondary | ICD-10-CM | POA: Insufficient documentation

## 2016-01-05 DIAGNOSIS — E785 Hyperlipidemia, unspecified: Secondary | ICD-10-CM | POA: Diagnosis not present

## 2016-01-05 DIAGNOSIS — I701 Atherosclerosis of renal artery: Secondary | ICD-10-CM | POA: Diagnosis not present

## 2016-01-05 DIAGNOSIS — I2510 Atherosclerotic heart disease of native coronary artery without angina pectoris: Secondary | ICD-10-CM | POA: Insufficient documentation

## 2016-01-05 DIAGNOSIS — I700 Atherosclerosis of aorta: Secondary | ICD-10-CM | POA: Diagnosis not present

## 2016-01-05 NOTE — Telephone Encounter (Signed)
Follow up      Talk to the nurse who is scheduling an appt for may

## 2016-01-05 NOTE — Telephone Encounter (Signed)
Routed to Maudie Mercury- this caller is not on the [facility] list. I do not see a ready result on today's test.

## 2016-01-05 NOTE — Telephone Encounter (Signed)
Pt contacted to discuss renal doppler results and setup appt next avliable. Pt unable to setup at this time, stated he will call office back to setup appt.

## 2016-01-05 NOTE — Telephone Encounter (Signed)
Follow up      Wife calling to see what was wrong with patient's test.  Patient was working and could not talk to the nurse, he told her to call the nurse.

## 2016-01-05 NOTE — Telephone Encounter (Signed)
Follow up      Daughter now calling to find out the results of patient's test earlier today.  She said her mother will not understand what the nurse says.

## 2016-01-05 NOTE — Telephone Encounter (Signed)
Spoke with pt wife, okay per DPR, pt setup for appt with Dr. Gwenlyn Found on 4/2 at 8:15am. Pt verbalized understanding, no additional questions at this time.

## 2016-01-12 ENCOUNTER — Other Ambulatory Visit: Payer: Self-pay | Admitting: Cardiovascular Disease

## 2016-01-12 NOTE — Telephone Encounter (Signed)
Rx(s) sent to pharmacy electronically.  

## 2016-01-17 DIAGNOSIS — Z6829 Body mass index (BMI) 29.0-29.9, adult: Secondary | ICD-10-CM | POA: Diagnosis not present

## 2016-01-17 DIAGNOSIS — R600 Localized edema: Secondary | ICD-10-CM | POA: Diagnosis not present

## 2016-01-17 DIAGNOSIS — I739 Peripheral vascular disease, unspecified: Secondary | ICD-10-CM | POA: Diagnosis not present

## 2016-01-17 DIAGNOSIS — I701 Atherosclerosis of renal artery: Secondary | ICD-10-CM | POA: Diagnosis not present

## 2016-01-17 DIAGNOSIS — I129 Hypertensive chronic kidney disease with stage 1 through stage 4 chronic kidney disease, or unspecified chronic kidney disease: Secondary | ICD-10-CM | POA: Diagnosis not present

## 2016-01-17 DIAGNOSIS — N183 Chronic kidney disease, stage 3 (moderate): Secondary | ICD-10-CM | POA: Diagnosis not present

## 2016-01-17 DIAGNOSIS — E1122 Type 2 diabetes mellitus with diabetic chronic kidney disease: Secondary | ICD-10-CM | POA: Diagnosis not present

## 2016-01-17 DIAGNOSIS — Z125 Encounter for screening for malignant neoplasm of prostate: Secondary | ICD-10-CM | POA: Diagnosis not present

## 2016-01-17 DIAGNOSIS — E668 Other obesity: Secondary | ICD-10-CM | POA: Diagnosis not present

## 2016-01-17 DIAGNOSIS — I272 Other secondary pulmonary hypertension: Secondary | ICD-10-CM | POA: Diagnosis not present

## 2016-01-17 DIAGNOSIS — Z0000 Encounter for general adult medical examination without abnormal findings: Secondary | ICD-10-CM | POA: Diagnosis not present

## 2016-01-17 DIAGNOSIS — I209 Angina pectoris, unspecified: Secondary | ICD-10-CM | POA: Diagnosis not present

## 2016-02-01 ENCOUNTER — Ambulatory Visit (INDEPENDENT_AMBULATORY_CARE_PROVIDER_SITE_OTHER): Payer: Medicare Other | Admitting: Cardiovascular Disease

## 2016-02-01 ENCOUNTER — Ambulatory Visit: Payer: Medicare Other | Admitting: Cardiovascular Disease

## 2016-02-01 ENCOUNTER — Encounter: Payer: Self-pay | Admitting: Cardiovascular Disease

## 2016-02-01 VITALS — BP 148/66 | HR 55 | Ht 67.0 in | Wt 185.0 lb

## 2016-02-01 DIAGNOSIS — D485 Neoplasm of uncertain behavior of skin: Secondary | ICD-10-CM | POA: Diagnosis not present

## 2016-02-01 DIAGNOSIS — L570 Actinic keratosis: Secondary | ICD-10-CM | POA: Diagnosis not present

## 2016-02-01 DIAGNOSIS — I701 Atherosclerosis of renal artery: Secondary | ICD-10-CM | POA: Diagnosis not present

## 2016-02-01 DIAGNOSIS — I739 Peripheral vascular disease, unspecified: Secondary | ICD-10-CM

## 2016-02-01 DIAGNOSIS — D1801 Hemangioma of skin and subcutaneous tissue: Secondary | ICD-10-CM | POA: Diagnosis not present

## 2016-02-01 DIAGNOSIS — Z85828 Personal history of other malignant neoplasm of skin: Secondary | ICD-10-CM | POA: Diagnosis not present

## 2016-02-01 DIAGNOSIS — L821 Other seborrheic keratosis: Secondary | ICD-10-CM | POA: Diagnosis not present

## 2016-02-01 NOTE — Patient Instructions (Addendum)
Medication Instructions:  Your physician recommends that you continue on your current medications as directed. Please refer to the Current Medication list given to you today.   Labwork: none  Testing/Procedures: CT Angiography Abdomen , an imaging test to look at the blood vessels in your kidneys. Your healthcare provider can use it to look at the ballooning of a blood vessel (aneurysm), narrowing of a blood vessel (stenosis), or blockages in a blood vessel. He or she can also see how well blood is flowing to your kidneys.   Follow-Up: Your physician wants you to follow-up in: 6 months with Dr. Andria Rhein will receive a reminder letter in the mail two months in advance. If you don't receive a letter, please call our office to schedule the follow-up appointment.   Any Other Special Instructions Will Be Listed Below (If Applicable).     If you need a refill on your cardiac medications before your next appointment, please call your pharmacy.

## 2016-02-01 NOTE — Assessment & Plan Note (Signed)
Mr. Garriott returns today for follow-up of his renal Doppler studies which were performed 01/05/16. This shows a significant progression compared to the ones performed 06/30/15. Blood pressure is under good control. His renal function is normal. Admittedly the studies were done by different technologist. I'm going to get a CT angiogram of  his renal arteries to determine whether he has high-grade bilateral renal artery stenosis. If so we will consider renal artery stenting for renal preservation.

## 2016-02-01 NOTE — Progress Notes (Signed)
Eric [name] returns today for follow-up of his renal Doppler studies. This showed significant progression compared to the one performed in September. They're consistent with high grade bilateral renal artery stenosis. His blood pressure is under good control on his renal function is normal by recent blood work.I'm going to obtain a CT angiogram of his abdomen to look at his renal arteries to determine whether or not the Doppler studies truly represent high grade bilateral renal artery stenosis. If this is the case we will entertain potential percutaneous revascularization for renal preservation.

## 2016-02-02 LAB — BASIC METABOLIC PANEL
BUN: 19 mg/dL (ref 7–25)
CALCIUM: 9.2 mg/dL (ref 8.6–10.3)
CO2: 27 mmol/L (ref 20–31)
CREATININE: 1.24 mg/dL — AB (ref 0.70–1.18)
Chloride: 104 mmol/L (ref 98–110)
GLUCOSE: 93 mg/dL (ref 65–99)
Potassium: 4.6 mmol/L (ref 3.5–5.3)
Sodium: 143 mmol/L (ref 135–146)

## 2016-02-03 DIAGNOSIS — H25013 Cortical age-related cataract, bilateral: Secondary | ICD-10-CM | POA: Diagnosis not present

## 2016-02-03 DIAGNOSIS — H2513 Age-related nuclear cataract, bilateral: Secondary | ICD-10-CM | POA: Diagnosis not present

## 2016-02-04 ENCOUNTER — Ambulatory Visit (INDEPENDENT_AMBULATORY_CARE_PROVIDER_SITE_OTHER)
Admission: RE | Admit: 2016-02-04 | Discharge: 2016-02-04 | Disposition: A | Discharge status: Home / Self Care | Payer: Medicare Other | Source: Ambulatory Visit | Attending: Cardiovascular Disease | Admitting: Cardiovascular Disease

## 2016-02-04 DIAGNOSIS — I739 Peripheral vascular disease, unspecified: Secondary | ICD-10-CM

## 2016-02-04 DIAGNOSIS — I701 Atherosclerosis of renal artery: Secondary | ICD-10-CM | POA: Diagnosis not present

## 2016-02-04 MED ORDER — IOPAMIDOL (ISOVUE-370) INJECTION 76%
100.0000 mL | Freq: Once | INTRAVENOUS | Status: AC | PRN
  Administered 2016-02-04: 100 mL via INTRAVENOUS

## 2016-02-07 ENCOUNTER — Telehealth: Payer: Self-pay | Admitting: Cardiovascular Disease

## 2016-02-07 NOTE — Telephone Encounter (Signed)
Mrs. Gourneau is calling to get results of Eric [name] CT Scan done on 02/04/16

## 2016-02-07 NOTE — Telephone Encounter (Signed)
Returned call to patient's wife. Informed her that the report has not been resulted on by MD but that when it is, we will contact them with the results. She states the operator told her the Dr. Gwenlyn Found was out of the office today. She states her granddaughter works at [employer] in the [place] and told her to call us today as we should have the results and she said her daughter works in primary care and told her that we should have the results to her already and she states the last time he had this kind of test, it took a week to get the results and "that's not fair". Apologized to wife for inconvenience but assured her we would contact them as soon as we got result information to report.

## 2016-02-08 ENCOUNTER — Telehealth: Payer: Self-pay | Admitting: Cardiovascular Disease

## 2016-02-08 NOTE — Telephone Encounter (Signed)
Spoke with pt wife, aware of CT scan results. Follow up scheduled with dr berry

## 2016-02-08 NOTE — Telephone Encounter (Signed)
Eric [name] is calling toget results on a test .. Please call   Thanks

## 2016-02-16 ENCOUNTER — Other Ambulatory Visit: Payer: Self-pay | Admitting: Cardiovascular Disease

## 2016-02-17 NOTE — Telephone Encounter (Signed)
Rx request sent to pharmacy.  

## 2016-03-01 ENCOUNTER — Encounter: Payer: Self-pay | Admitting: Cardiovascular Disease

## 2016-03-01 ENCOUNTER — Other Ambulatory Visit: Payer: Self-pay | Admitting: *Deleted

## 2016-03-01 ENCOUNTER — Ambulatory Visit (INDEPENDENT_AMBULATORY_CARE_PROVIDER_SITE_OTHER): Payer: Medicare Other | Admitting: Cardiovascular Disease

## 2016-03-01 VITALS — BP 142/63 | HR 58 | Ht 67.5 in | Wt 189.4 lb

## 2016-03-01 DIAGNOSIS — I701 Atherosclerosis of renal artery: Secondary | ICD-10-CM

## 2016-03-01 DIAGNOSIS — Z01818 Encounter for other preprocedural examination: Secondary | ICD-10-CM | POA: Diagnosis not present

## 2016-03-01 DIAGNOSIS — Z0000 Encounter for general adult medical examination without abnormal findings: Secondary | ICD-10-CM | POA: Diagnosis not present

## 2016-03-01 DIAGNOSIS — Z79899 Other long term (current) drug therapy: Secondary | ICD-10-CM | POA: Diagnosis not present

## 2016-03-01 NOTE — Patient Instructions (Addendum)
Medication Instructions:  Your physician recommends that you continue on your current medications as directed. Please refer to the Current Medication list given to you today.   Labwork:  Testing/Procedures:  Your physician has requested that you have a renal artery angiogram with stent placement.  POSSIBLE DATES June 19, 26, OR 29.   Following your angiogram, you will not be allowed to drive for 3 days.  No lifting, pushing, or pulling greater that 10 pounds is allowed for 1 week.  You will be required to have the following tests prior to the procedure:  1. Blood work-the blood work can be done no more than 14 days prior to the procedure.  It can be done at any [facility] lab.  There is one downstairs on the first floor of this building and one in the [facility] building [phone]).  2. Chest Xray-the chest xray order has already been placed at the [facility].      Puncture site right groin     If you need a refill on your cardiac medications before your next appointment, please call your pharmacy.

## 2016-03-01 NOTE — Assessment & Plan Note (Signed)
Eric [name] returns today for follow-up of his CT angiogram and renal Doppler studies.the CT scan performed 02/04/16 showed high-grade ostial left renal artery stenosis with probable right renal artery stenosis as well. This correlates with his renal Doppler study performed 01/05/16. His blood pressure is under good control and he has preserved renal function. I'm planning on performing renal stenting for renal preservation. The patient understands the risks and wishes to proceed.

## 2016-03-01 NOTE — Progress Notes (Signed)
03/01/2016 Eric [name]   November 30, 1935  [id number]  Primary Physician Eric Reel, MD Primary Cardiologist: Eric Harp MD Eric [name]   HPI:  The patient is a 80 year old mildly to moderately overweight married Caucasian male father of 32, grandfather to 8 grandchildren who is accompanied by his wife today as usual. I last saw him 12 months ago. He has a history of CAD and PVOD. I stented his LAD and diagonal branch back on July 14, 2003 and subsequent stenting by Eric [name] July 31, 2003. He had mild bilateral renal artery stenosis at cath with blockages in the 40% range as well as moderate right internal carotid artery stenosis by duplex ultrasound. He was neurologic asymptomatic. His other problems include hypertension and hyperlipidemia. He has obstructive sleep apnea followed by Dr. Claiborne [name]. His most recent lab work revealed a total cholesterol of 125, LDL of 58 and HDL of 36. Carotid Dopplers performed in September 2016 revealed moderate right ICA stenosis, unchanged from a prior study and renal Dopplers performed 01/05/16 showed progression of bilateral renal artery stenosis. CT angiography performed 02/04/16 that showed high-grade ostial left renal artery stenosis with probable right renal artery stenosis as well.   Current Outpatient Prescriptions  Medication Sig Dispense Refill  . aspirin 81 MG tablet Take 81 mg by mouth daily.      Marland Kitchen atorvastatin (LIPITOR) 80 MG tablet TAKE 1 TABLET BY MOUTH EVERY DAY 30 tablet 6  . clopidogrel (PLAVIX) 75 MG tablet TAKE 1 TABLET EVERY DAY 90 tablet 3  . diazepam (VALIUM) 5 MG tablet TAKE 1 TABLET EVERY 8 HOURS AS NEEDED 90 tablet 3  . furosemide (LASIX) 20 MG tablet Take 1 tablet (20 mg total) by mouth every other day. Take 1 tablet & alternate with 2 tablets every other day 30 tablet 6  . lansoprazole (PREVACID) 15 MG capsule Take 15 mg by mouth daily.      Marland Kitchen losartan (COZAAR) 100 MG tablet Take 100 mg by mouth daily.    .  meclizine (ANTIVERT) 25 MG tablet TAKE ONE TABLET BY MOUTH EVERY 8 HOURS AS NEEDED FOR DIZZINESS 90 tablet 2  . metoprolol (LOPRESSOR) 50 MG tablet TAKE 1.5 TABLETS (75 MG TOTAL) BY MOUTH 2 (TWO) TIMES DAILY. 270 tablet 2  . nitroGLYCERIN (NITROSTAT) 0.4 MG SL tablet Place 1 tablet (0.4 mg total) under the tongue every 5 (five) minutes as needed for chest pain. 25 tablet 4  . ZETIA 10 MG tablet TAKE 1 TABLET (10 MG TOTAL) BY MOUTH DAILY. 30 tablet 11   No current facility-administered medications for this visit.    Allergies  Allergen Reactions  . Niaspan [Niacin Er]   . Sertraline Hcl     REACTION: disorientation  . Tetanus Toxoids     Social History   Social History  . Marital Status: Married    Spouse Name: Eric [name]  . Number of Children: 4  . Years of Education: N/A   Occupational History  . Retired    Social History Main Topics  . Smoking status: Former Smoker -- 1.00 packs/day for 15 years    Types: Cigarettes    Quit date: 10/02/1964  . Smokeless tobacco: Never Used     Comment: 1 ppd x 15 years  . Alcohol Use: No  . Drug Use: No  . Sexual Activity: Not on file   Other Topics Concern  . Not on file   Social History Narrative     Review of Systems:  General: negative for chills, fever, night sweats or weight changes.  Cardiovascular: negative for chest pain, dyspnea on exertion, edema, orthopnea, palpitations, paroxysmal nocturnal dyspnea or shortness of breath Dermatological: negative for rash Respiratory: negative for cough or wheezing Urologic: negative for hematuria Abdominal: negative for nausea, vomiting, diarrhea, bright red blood per rectum, melena, or hematemesis Neurologic: negative for visual changes, syncope, or dizziness All other systems reviewed and are otherwise negative except as noted above.    Blood pressure 142/63, pulse 58, height 5' 7.5" (1.715 m), weight 189 lb 6.4 oz (85.911 kg).  General appearance: alert and no distress Neck: no  adenopathy, no JVD, supple, symmetrical, trachea midline, thyroid not enlarged, symmetric, no tenderness/mass/nodules and right carotid bruit Lungs: clear to auscultation bilaterally Heart: regular rate and rhythm, S1, S2 normal, no murmur, click, rub or gallop Extremities: extremities normal, atraumatic, no cyanosis or edema  EKG not performed today  ASSESSMENT AND PLAN:   Bilateral renal artery stenosis Eric [name]) Eric [name] returns today for follow-up of his CT angiogram and renal Doppler studies.the CT scan performed 02/04/16 showed high-grade ostial left renal artery stenosis with probable right renal artery stenosis as well. This correlates with his renal Doppler study performed 01/05/16. His blood pressure is under good control and he has preserved renal function. I'm planning on performing renal stenting for renal preservation. The patient understands the risks and wishes to proceed.      Eric Harp MD FACP,FACC,FAHA, [facility] 03/01/2016 12:02 PM

## 2016-03-09 ENCOUNTER — Ambulatory Visit
Admission: RE | Admit: 2016-03-09 | Discharge: 2016-03-09 | Disposition: A | Discharge status: Home / Self Care | Payer: Medicare Other | Source: Ambulatory Visit | Attending: Cardiovascular Disease | Admitting: Cardiovascular Disease

## 2016-03-09 DIAGNOSIS — I701 Atherosclerosis of renal artery: Secondary | ICD-10-CM | POA: Diagnosis not present

## 2016-03-09 DIAGNOSIS — Z01818 Encounter for other preprocedural examination: Secondary | ICD-10-CM | POA: Diagnosis not present

## 2016-03-09 DIAGNOSIS — Z0000 Encounter for general adult medical examination without abnormal findings: Secondary | ICD-10-CM

## 2016-03-09 DIAGNOSIS — Z79899 Other long term (current) drug therapy: Secondary | ICD-10-CM | POA: Diagnosis not present

## 2016-03-09 LAB — CBC WITH DIFFERENTIAL/PLATELET
BASOS PCT: 0 %
Basophils Absolute: 0 cells/uL (ref 0–200)
Eosinophils Absolute: 294 cells/uL (ref 15–500)
Eosinophils Relative: 3 %
HCT: 42.4 % (ref 38.5–50.0)
Hemoglobin: 14.4 g/dL (ref 13.2–17.1)
LYMPHS PCT: 25 %
Lymphs Abs: 2450 cells/uL (ref 850–3900)
MCH: 31 pg (ref 27.0–33.0)
MCHC: 34 g/dL (ref 32.0–36.0)
MCV: 91.2 fL (ref 80.0–100.0)
MONOS PCT: 8 %
MPV: 10.4 fL (ref 7.5–12.5)
Monocytes Absolute: 784 cells/uL (ref 200–950)
NEUTROS PCT: 64 %
Neutro Abs: 6272 cells/uL (ref 1500–7800)
PLATELETS: 281 10*3/uL (ref 140–400)
RBC: 4.65 MIL/uL (ref 4.20–5.80)
RDW: 12.9 % (ref 11.0–15.0)
WBC: 9.8 10*3/uL (ref 3.8–10.8)

## 2016-03-09 LAB — PROTIME-INR
INR: 1.04 (ref <1.50)
PROTHROMBIN TIME: 13.7 s (ref 11.6–15.2)

## 2016-03-09 LAB — APTT: aPTT: 35 seconds (ref 24–37)

## 2016-03-10 ENCOUNTER — Telehealth: Payer: Self-pay | Admitting: *Deleted

## 2016-03-10 DIAGNOSIS — Z01818 Encounter for other preprocedural examination: Secondary | ICD-10-CM

## 2016-03-10 DIAGNOSIS — R7989 Other specified abnormal findings of blood chemistry: Secondary | ICD-10-CM

## 2016-03-10 DIAGNOSIS — Z79899 Other long term (current) drug therapy: Secondary | ICD-10-CM

## 2016-03-10 LAB — BASIC METABOLIC PANEL
BUN: 19 mg/dL (ref 7–25)
CHLORIDE: 103 mmol/L (ref 98–110)
CO2: 27 mmol/L (ref 20–31)
CREATININE: 1.54 mg/dL — AB (ref 0.70–1.18)
Calcium: 9.5 mg/dL (ref 8.6–10.3)
Glucose, Bld: 113 mg/dL — ABNORMAL HIGH (ref 65–99)
Potassium: 4.8 mmol/L (ref 3.5–5.3)
SODIUM: 143 mmol/L (ref 135–146)

## 2016-03-10 LAB — TSH: TSH: 2.93 mIU/L (ref 0.40–4.50)

## 2016-03-10 NOTE — Telephone Encounter (Signed)
-----   Message from Lorretta Harp, MD sent at 03/10/2016  3:21 PM EDT ----- SCr has increased. When is his Renal angio? Will need to recheck SCr , hold diuretics and ACE/ARB . May need to admit night before for hydrtaion

## 2016-03-10 NOTE — Telephone Encounter (Signed)
No answer. Left message to call back.   

## 2016-03-13 NOTE — Telephone Encounter (Signed)
Follow-up     The wife called back and is confused about the mediations jennifer went over. Please call her back.

## 2016-03-13 NOTE — Telephone Encounter (Signed)
Returned call to pt's wife, ok per DPR. Pt wanted me to speak with her instead. Gave her the results of pt's lab work with the creatinine being elevated. Gave her Dr Kennon Holter advice to not take lasix and losartan for now and to have repeat lab work on Thursday. Explained that if the creatinine is still elevated, the pt may need to be admitted for prehydration on Sunday evening prior to Monday procedure. Explained to her that someone would call them Sunday afternoon with the time and where for them to come in if that is indeed what will need to happen. We will make final details once we get the results of the lab work later this week. She verbalized understanding.

## 2016-03-13 NOTE — Telephone Encounter (Signed)
Returned call to [facility].  Went over which medications pt is not to take. She verbalized understanding that pt is not to take furosemide and losartan until direction is given for him to begin again. She stated that pt has not been taking the furosemide already. He only takes furosemide as needed and it has been a while since he took it.

## 2016-03-13 NOTE — Telephone Encounter (Signed)
Follow up ° ° ° ° ° °Returning a call to the nurse °

## 2016-03-15 NOTE — Telephone Encounter (Signed)
Follow up     Wife says pt missed a call [facility].  She tried to call back but cannot get them on the phone.  Can you call [facility] and have them call Eric [name] at home?  Mr Spratley is at work and cannot talk on his cell phone.

## 2016-03-15 NOTE — Telephone Encounter (Signed)
Called pharmacy and left msg to call pt's home line.

## 2016-03-16 DIAGNOSIS — Z79899 Other long term (current) drug therapy: Secondary | ICD-10-CM | POA: Diagnosis not present

## 2016-03-16 DIAGNOSIS — Z01818 Encounter for other preprocedural examination: Secondary | ICD-10-CM | POA: Diagnosis not present

## 2016-03-16 DIAGNOSIS — R7989 Other specified abnormal findings of blood chemistry: Secondary | ICD-10-CM | POA: Diagnosis not present

## 2016-03-16 DIAGNOSIS — R748 Abnormal levels of other serum enzymes: Secondary | ICD-10-CM | POA: Diagnosis not present

## 2016-03-17 ENCOUNTER — Telehealth: Payer: Self-pay | Admitting: Cardiovascular Disease

## 2016-03-17 LAB — BASIC METABOLIC PANEL
BUN: 21 mg/dL (ref 7–25)
CHLORIDE: 102 mmol/L (ref 98–110)
CO2: 32 mmol/L — AB (ref 20–31)
CREATININE: 1.21 mg/dL — AB (ref 0.70–1.18)
Calcium: 9.5 mg/dL (ref 8.6–10.3)
Glucose, Bld: 118 mg/dL — ABNORMAL HIGH (ref 65–99)
POTASSIUM: 4.9 mmol/L (ref 3.5–5.3)
Sodium: 140 mmol/L (ref 135–146)

## 2016-03-17 NOTE — Telephone Encounter (Signed)
Creatinine 1.21 on recheck yesterday. Based on this, OK for the patient to come day of cath, or does he need preprocedure admission?

## 2016-03-17 NOTE — Telephone Encounter (Signed)
Pt's wife is calling in to see what the results from his pre-op labs were. She wants to know if the pt will need to go to the hospital Sunday or Monday(the day of his surgery). Please f/u with her.   Thanks

## 2016-03-17 NOTE — Telephone Encounter (Signed)
Spoke w/ Dr. Gwenlyn Found, he advised [facility] for patient to come in day of procedure - he does not require preprocedure overnight admission.  I communicated this w/ caller, who verbalized understanding and thanks.

## 2016-03-20 ENCOUNTER — Ambulatory Visit (HOSPITAL_COMMUNITY)
Admission: RE | Admit: 2016-03-20 | Discharge: 2016-03-21 | Disposition: A | Discharge status: Home / Self Care | Payer: Medicare Other | Source: Ambulatory Visit | Attending: Cardiovascular Disease | Admitting: Cardiovascular Disease

## 2016-03-20 ENCOUNTER — Encounter (HOSPITAL_COMMUNITY): Payer: Self-pay | Admitting: General Practice

## 2016-03-20 ENCOUNTER — Encounter (HOSPITAL_COMMUNITY)
Admission: RE | Disposition: A | Discharge status: Home / Self Care | Payer: Self-pay | Source: Ambulatory Visit | Attending: Cardiovascular Disease

## 2016-03-20 DIAGNOSIS — I701 Atherosclerosis of renal artery: Secondary | ICD-10-CM | POA: Diagnosis not present

## 2016-03-20 DIAGNOSIS — I739 Peripheral vascular disease, unspecified: Secondary | ICD-10-CM | POA: Diagnosis not present

## 2016-03-20 DIAGNOSIS — E663 Overweight: Secondary | ICD-10-CM | POA: Insufficient documentation

## 2016-03-20 DIAGNOSIS — Z955 Presence of coronary angioplasty implant and graft: Secondary | ICD-10-CM | POA: Insufficient documentation

## 2016-03-20 DIAGNOSIS — Z7982 Long term (current) use of aspirin: Secondary | ICD-10-CM | POA: Insufficient documentation

## 2016-03-20 DIAGNOSIS — G4733 Obstructive sleep apnea (adult) (pediatric): Secondary | ICD-10-CM | POA: Insufficient documentation

## 2016-03-20 DIAGNOSIS — E785 Hyperlipidemia, unspecified: Secondary | ICD-10-CM | POA: Diagnosis not present

## 2016-03-20 DIAGNOSIS — Z7902 Long term (current) use of antithrombotics/antiplatelets: Secondary | ICD-10-CM | POA: Diagnosis not present

## 2016-03-20 DIAGNOSIS — Z6828 Body mass index (BMI) 28.0-28.9, adult: Secondary | ICD-10-CM | POA: Insufficient documentation

## 2016-03-20 DIAGNOSIS — I6521 Occlusion and stenosis of right carotid artery: Secondary | ICD-10-CM | POA: Insufficient documentation

## 2016-03-20 DIAGNOSIS — Z87891 Personal history of nicotine dependence: Secondary | ICD-10-CM | POA: Insufficient documentation

## 2016-03-20 DIAGNOSIS — I2510 Atherosclerotic heart disease of native coronary artery without angina pectoris: Secondary | ICD-10-CM | POA: Insufficient documentation

## 2016-03-20 DIAGNOSIS — I10 Essential (primary) hypertension: Secondary | ICD-10-CM | POA: Insufficient documentation

## 2016-03-20 HISTORY — DX: Meniere's disease, unspecified ear: H81.09

## 2016-03-20 HISTORY — DX: Unspecified osteoarthritis, unspecified site: M19.90

## 2016-03-20 HISTORY — DX: Atherosclerotic heart disease of native coronary artery without angina pectoris: I25.10

## 2016-03-20 HISTORY — DX: Acute myocardial infarction, unspecified: I21.9

## 2016-03-20 HISTORY — DX: Cardiac murmur, unspecified: R01.1

## 2016-03-20 HISTORY — PX: RENAL ARTERY STENT: SHX2321

## 2016-03-20 HISTORY — PX: PERIPHERAL VASCULAR CATHETERIZATION: SHX172C

## 2016-03-20 LAB — POCT ACTIVATED CLOTTING TIME
ACTIVATED CLOTTING TIME: 158 s
Activated Clotting Time: 202 seconds
Activated Clotting Time: 235 seconds

## 2016-03-20 SURGERY — RENAL ANGIOGRAPHY

## 2016-03-20 MED ORDER — ASPIRIN 81 MG PO CHEW: 81.0000 mg | CHEWABLE_TABLET | ORAL | Status: DC

## 2016-03-20 MED ORDER — PANTOPRAZOLE SODIUM 40 MG PO TBEC
40.0000 mg | DELAYED_RELEASE_TABLET | Freq: Every day | ORAL | Status: DC
  Administered 2016-03-20: 40 mg via ORAL
  Filled 2016-03-20 (×2): qty 1

## 2016-03-20 MED ORDER — METOPROLOL TARTRATE 50 MG PO TABS
50.0000 mg | ORAL_TABLET | Freq: Two times a day (BID) | ORAL | Status: DC
  Administered 2016-03-20 – 2016-03-21 (×2): 50 mg via ORAL
  Filled 2016-03-20: qty 2
  Filled 2016-03-20 (×2): qty 1
  Filled 2016-03-20: qty 2
  Filled 2016-03-20: qty 1

## 2016-03-20 MED ORDER — MIDAZOLAM HCL 2 MG/2ML IJ SOLN
INTRAMUSCULAR | Status: DC | PRN
  Administered 2016-03-20: 1 mg via INTRAVENOUS

## 2016-03-20 MED ORDER — HYDRALAZINE HCL 20 MG/ML IJ SOLN
INTRAMUSCULAR | Status: AC
  Filled 2016-03-20: qty 1

## 2016-03-20 MED ORDER — ATORVASTATIN CALCIUM 80 MG PO TABS
80.0000 mg | ORAL_TABLET | Freq: Every day | ORAL | Status: DC
  Administered 2016-03-20: 21:00:00 80 mg via ORAL
  Filled 2016-03-20 (×2): qty 1

## 2016-03-20 MED ORDER — SODIUM CHLORIDE 0.9 % IV SOLN: INTRAVENOUS | Status: AC

## 2016-03-20 MED ORDER — DIAZEPAM 5 MG PO TABS
5.0000 mg | ORAL_TABLET | Freq: Four times a day (QID) | ORAL | Status: DC | PRN
  Administered 2016-03-20 – 2016-03-21 (×2): 5 mg via ORAL
  Filled 2016-03-20 (×2): qty 1

## 2016-03-20 MED ORDER — FENTANYL CITRATE (PF) 100 MCG/2ML IJ SOLN
INTRAMUSCULAR | Status: DC | PRN
  Administered 2016-03-20: 25 ug via INTRAVENOUS

## 2016-03-20 MED ORDER — LIDOCAINE HCL (PF) 1 % IJ SOLN
INTRAMUSCULAR | Status: DC | PRN
  Administered 2016-03-20: 22 mL

## 2016-03-20 MED ORDER — HEPARIN SODIUM (PORCINE) 1000 UNIT/ML IJ SOLN
INTRAMUSCULAR | Status: DC | PRN
  Administered 2016-03-20: 6000 [IU] via INTRAVENOUS

## 2016-03-20 MED ORDER — MIDAZOLAM HCL 2 MG/2ML IJ SOLN
INTRAMUSCULAR | Status: AC
  Filled 2016-03-20: qty 2

## 2016-03-20 MED ORDER — CLOPIDOGREL BISULFATE 75 MG PO TABS
75.0000 mg | ORAL_TABLET | Freq: Every day | ORAL | Status: DC
Start: 2016-03-20 — End: 2016-03-21
  Administered 2016-03-20: 18:00:00 75 mg via ORAL
  Filled 2016-03-20: qty 1

## 2016-03-20 MED ORDER — LIDOCAINE HCL (PF) 1 % IJ SOLN
INTRAMUSCULAR | Status: AC
  Filled 2016-03-20: qty 30

## 2016-03-20 MED ORDER — MORPHINE SULFATE (PF) 2 MG/ML IV SOLN: 2.0000 mg | INTRAVENOUS | Status: DC | PRN

## 2016-03-20 MED ORDER — SODIUM CHLORIDE 0.9 % WEIGHT BASED INFUSION: 1.0000 mL/kg/h | INTRAVENOUS | Status: DC

## 2016-03-20 MED ORDER — EZETIMIBE 10 MG PO TABS
10.0000 mg | ORAL_TABLET | Freq: Every day | ORAL | Status: DC
  Administered 2016-03-20: 10 mg via ORAL
  Filled 2016-03-20 (×2): qty 1

## 2016-03-20 MED ORDER — HEPARIN (PORCINE) IN NACL 2-0.9 UNIT/ML-% IJ SOLN
INTRAMUSCULAR | Status: DC | PRN
  Administered 2016-03-20: 1000 mL

## 2016-03-20 MED ORDER — HYDRALAZINE HCL 20 MG/ML IJ SOLN
INTRAMUSCULAR | Status: DC | PRN
  Administered 2016-03-20: 10 mg via INTRAVENOUS

## 2016-03-20 MED ORDER — ASPIRIN 81 MG PO CHEW
81.0000 mg | CHEWABLE_TABLET | Freq: Every day | ORAL | Status: DC
  Administered 2016-03-21: 09:00:00 81 mg via ORAL
  Filled 2016-03-20: qty 1

## 2016-03-20 MED ORDER — IODIXANOL 320 MG/ML IV SOLN
INTRAVENOUS | Status: DC | PRN
  Administered 2016-03-20: 85 mL via INTRA_ARTERIAL

## 2016-03-20 MED ORDER — ANGIOPLASTY BOOK
Freq: Once | Status: AC
  Administered 2016-03-20: 10:00:00
  Filled 2016-03-20: qty 1

## 2016-03-20 MED ORDER — FENTANYL CITRATE (PF) 100 MCG/2ML IJ SOLN
INTRAMUSCULAR | Status: AC
  Filled 2016-03-20: qty 2

## 2016-03-20 MED ORDER — SODIUM CHLORIDE 0.9 % WEIGHT BASED INFUSION
3.0000 mL/kg/h | INTRAVENOUS | Status: DC
  Administered 2016-03-20: 3 mL/kg/h via INTRAVENOUS

## 2016-03-20 MED ORDER — ACETAMINOPHEN 325 MG PO TABS: 650.0000 mg | ORAL_TABLET | ORAL | Status: DC | PRN

## 2016-03-20 MED ORDER — HYDRALAZINE HCL 20 MG/ML IJ SOLN
10.0000 mg | INTRAMUSCULAR | Status: DC | PRN
  Administered 2016-03-20: 11:00:00 10 mg via INTRAVENOUS
  Filled 2016-03-20: qty 1

## 2016-03-20 MED ORDER — ONDANSETRON HCL 4 MG/2ML IJ SOLN: 4.0000 mg | Freq: Four times a day (QID) | INTRAMUSCULAR | Status: DC | PRN

## 2016-03-20 MED ORDER — HEPARIN (PORCINE) IN NACL 2-0.9 UNIT/ML-% IJ SOLN
INTRAMUSCULAR | Status: AC
  Filled 2016-03-20: qty 1000

## 2016-03-20 MED ORDER — HEPARIN SODIUM (PORCINE) 1000 UNIT/ML IJ SOLN
INTRAMUSCULAR | Status: AC
  Filled 2016-03-20: qty 1

## 2016-03-20 MED ORDER — SODIUM CHLORIDE 0.9% FLUSH: 3.0000 mL | INTRAVENOUS | Status: DC | PRN

## 2016-03-20 MED ORDER — MECLIZINE HCL 25 MG PO TABS
25.0000 mg | ORAL_TABLET | Freq: Two times a day (BID) | ORAL | Status: DC | PRN
  Administered 2016-03-20 – 2016-03-21 (×2): 25 mg via ORAL
  Filled 2016-03-20 (×3): qty 1

## 2016-03-20 MED ORDER — NITROGLYCERIN 0.4 MG SL SUBL: 0.4000 mg | SUBLINGUAL_TABLET | SUBLINGUAL | Status: DC | PRN

## 2016-03-20 SURGICAL SUPPLY — 16 items
BALLN VIATRAC 4X20X135 (BALLOONS) ×3
BALLOON VIATRAC 4X20X135 (BALLOONS) IMPLANT
CATH ANGIO 5F PIGTAIL 65CM (CATHETERS) ×2 IMPLANT
DEVICE CONTINUOUS FLUSH (MISCELLANEOUS) ×2 IMPLANT
GUIDE CATH VISTA JR4 6F (CATHETERS) ×2 IMPLANT
KIT ENCORE 26 ADVANTAGE (KITS) ×3 IMPLANT
KIT PV (KITS) ×3 IMPLANT
SHEATH PINNACLE 6F 10CM (SHEATH) ×2 IMPLANT
STENT HERCULINK RX 6.0X15X135 (Permanent Stent) ×2 IMPLANT
STOPCOCK MORSE 400PSI 3WAY (MISCELLANEOUS) ×2 IMPLANT
SYRINGE MEDRAD AVANTA MACH 7 (SYRINGE) ×2 IMPLANT
TRANSDUCER W/STOPCOCK (MISCELLANEOUS) ×3 IMPLANT
TRAY PV CATH (CUSTOM PROCEDURE TRAY) ×3 IMPLANT
TUBING CIL FLEX 10 FLL-RA (TUBING) ×2 IMPLANT
WIRE HI TORQ VERSACORE-J 145CM (WIRE) ×2 IMPLANT
WIRE STABILIZER XS .014X180CM (WIRE) ×4 IMPLANT

## 2016-03-20 NOTE — H&P (View-Only) (Signed)
03/01/2016 Eric [name]   05-22-1936  [id number]  Primary Physician Eric Reel, MD Primary Cardiologist: Eric Harp MD Eric [name]   HPI:  The patient is a 80 year old mildly to moderately overweight married Caucasian male father of 78, grandfather to 8 grandchildren who is accompanied by his wife today as usual. I last saw him 12 months ago. He has a history of CAD and PVOD. I stented his LAD and diagonal branch back on July 14, 2003 and subsequent stenting by Dr. Melvern [name] July 31, 2003. He had mild bilateral renal artery stenosis at cath with blockages in the 40% range as well as moderate right internal carotid artery stenosis by duplex ultrasound. He was neurologic asymptomatic. His other problems include hypertension and hyperlipidemia. He has obstructive sleep apnea followed by Dr. Claiborne [name]. His most recent lab work revealed a total cholesterol of 125, LDL of 58 and HDL of 36. Carotid Dopplers performed in September 2016 revealed moderate right ICA stenosis, unchanged from a prior study and renal Dopplers performed 01/05/16 showed progression of bilateral renal artery stenosis. CT angiography performed 02/04/16 that showed high-grade ostial left renal artery stenosis with probable right renal artery stenosis as well.   Current Outpatient Prescriptions  Medication Sig Dispense Refill  . aspirin 81 MG tablet Take 81 mg by mouth daily.      Marland Kitchen atorvastatin (LIPITOR) 80 MG tablet TAKE 1 TABLET BY MOUTH EVERY DAY 30 tablet 6  . clopidogrel (PLAVIX) 75 MG tablet TAKE 1 TABLET EVERY DAY 90 tablet 3  . diazepam (VALIUM) 5 MG tablet TAKE 1 TABLET EVERY 8 HOURS AS NEEDED 90 tablet 3  . furosemide (LASIX) 20 MG tablet Take 1 tablet (20 mg total) by mouth every other day. Take 1 tablet & alternate with 2 tablets every other day 30 tablet 6  . lansoprazole (PREVACID) 15 MG capsule Take 15 mg by mouth daily.      Marland Kitchen losartan (COZAAR) 100 MG tablet Take 100 mg by mouth daily.    .  meclizine (ANTIVERT) 25 MG tablet TAKE ONE TABLET BY MOUTH EVERY 8 HOURS AS NEEDED FOR DIZZINESS 90 tablet 2  . metoprolol (LOPRESSOR) 50 MG tablet TAKE 1.5 TABLETS (75 MG TOTAL) BY MOUTH 2 (TWO) TIMES DAILY. 270 tablet 2  . nitroGLYCERIN (NITROSTAT) 0.4 MG SL tablet Place 1 tablet (0.4 mg total) under the tongue every 5 (five) minutes as needed for chest pain. 25 tablet 4  . ZETIA 10 MG tablet TAKE 1 TABLET (10 MG TOTAL) BY MOUTH DAILY. 30 tablet 11   No current facility-administered medications for this visit.    Allergies  Allergen Reactions  . Niaspan [Niacin Er]   . Sertraline Hcl     REACTION: disorientation  . Tetanus Toxoids     Social History   Social History  . Marital Status: Married    Spouse Name: Eric [name]  . Number of Children: 4  . Years of Education: N/A   Occupational History  . Retired    Social History Main Topics  . Smoking status: Former Smoker -- 1.00 packs/day for 15 years    Types: Cigarettes    Quit date: 10/02/1964  . Smokeless tobacco: Never Used     Comment: 1 ppd x 15 years  . Alcohol Use: No  . Drug Use: No  . Sexual Activity: Not on file   Other Topics Concern  . Not on file   Social History Narrative     Review of Systems:  General: negative for chills, fever, night sweats or weight changes.  Cardiovascular: negative for chest pain, dyspnea on exertion, edema, orthopnea, palpitations, paroxysmal nocturnal dyspnea or shortness of breath Dermatological: negative for rash Respiratory: negative for cough or wheezing Urologic: negative for hematuria Abdominal: negative for nausea, vomiting, diarrhea, bright red blood per rectum, melena, or hematemesis Neurologic: negative for visual changes, syncope, or dizziness All other systems reviewed and are otherwise negative except as noted above.    Blood pressure 142/63, pulse 58, height 5' 7.5" (1.715 m), weight 189 lb 6.4 oz (85.911 kg).  General appearance: alert and no distress Neck: no  adenopathy, no JVD, supple, symmetrical, trachea midline, thyroid not enlarged, symmetric, no tenderness/mass/nodules and right carotid bruit Lungs: clear to auscultation bilaterally Heart: regular rate and rhythm, S1, S2 normal, no murmur, click, rub or gallop Extremities: extremities normal, atraumatic, no cyanosis or edema  EKG not performed today  ASSESSMENT AND PLAN:   Bilateral renal artery stenosis [facility]) Mr. Eric [name] returns today for follow-up of his CT angiogram and renal Doppler studies.the CT scan performed 02/04/16 showed high-grade ostial left renal artery stenosis with probable right renal artery stenosis as well. This correlates with his renal Doppler study performed 01/05/16. His blood pressure is under good control and he has preserved renal function. I'm planning on performing renal stenting for renal preservation. The patient understands the risks and wishes to proceed.      Eric Harp MD FACP,FACC,FAHA, [facility] 03/01/2016 12:02 PM

## 2016-03-20 NOTE — Progress Notes (Signed)
Patient is sitting up in bed, A&O by four. Unlabored breathing noted. Dressing noted to right groin remains in place C/D/I. No bleeding and no hematoma noted. Patient's stent card given to patient's wife at bedside. No S/S of distress noted or complaints voiced at this time. Call bell is in reach and bed is in lowest position. Patient's wife remains at bedside.

## 2016-03-20 NOTE — Progress Notes (Signed)
Site area: right groin  Site Prior to Removal:  Level 0  Pressure Applied For 30 MINUTES    Minutes Beginning at 1100  Manual:   Yes.    Patient Status During Pull:  Patient remains A&O by four, Unlabored breathing noted. No pain.   Post Pull Groin Site:  Level 0  Post Pull Instructions Given:  Yes.    Post Pull Pulses Present:  Yes.    Dressing Applied:  Yes.    Comments:  Patient tolerated well, no s/s of distress noted or complaints voiced. No bleeding noted. No hematoma noted. No complaints of pain voiced. Pressure dressing applied C/D/I.

## 2016-03-20 NOTE — Interval H&P Note (Signed)
History and Physical Interval Note:  03/20/2016 8:13 AM  Eric [name]  has presented today for surgery, with the diagnosis of renal artery stenosis  The various methods of treatment have been discussed with the patient and family. After consideration of risks, benefits and other options for treatment, the patient has consented to  Procedure(s): Renal Angiography (N/A) as a surgical intervention .  The patient's history has been reviewed, patient examined, no change in status, stable for surgery.  I have reviewed the patient's chart and labs.  Questions were answered to the patient's satisfaction.     Quay Burow

## 2016-03-21 ENCOUNTER — Other Ambulatory Visit: Payer: Self-pay | Admitting: Student

## 2016-03-21 ENCOUNTER — Encounter (HOSPITAL_COMMUNITY): Payer: Self-pay | Admitting: Student

## 2016-03-21 ENCOUNTER — Other Ambulatory Visit: Payer: Self-pay | Admitting: Cardiovascular Disease

## 2016-03-21 DIAGNOSIS — I6521 Occlusion and stenosis of right carotid artery: Secondary | ICD-10-CM | POA: Diagnosis not present

## 2016-03-21 DIAGNOSIS — I701 Atherosclerosis of renal artery: Secondary | ICD-10-CM

## 2016-03-21 DIAGNOSIS — I739 Peripheral vascular disease, unspecified: Secondary | ICD-10-CM

## 2016-03-21 DIAGNOSIS — E785 Hyperlipidemia, unspecified: Secondary | ICD-10-CM | POA: Diagnosis not present

## 2016-03-21 DIAGNOSIS — I10 Essential (primary) hypertension: Secondary | ICD-10-CM

## 2016-03-21 DIAGNOSIS — Z955 Presence of coronary angioplasty implant and graft: Secondary | ICD-10-CM | POA: Diagnosis not present

## 2016-03-21 DIAGNOSIS — I2510 Atherosclerotic heart disease of native coronary artery without angina pectoris: Secondary | ICD-10-CM | POA: Diagnosis not present

## 2016-03-21 LAB — BASIC METABOLIC PANEL
Anion gap: 8 (ref 5–15)
BUN: 16 mg/dL (ref 6–20)
CALCIUM: 9 mg/dL (ref 8.9–10.3)
CHLORIDE: 107 mmol/L (ref 101–111)
CO2: 26 mmol/L (ref 22–32)
CREATININE: 1.31 mg/dL — AB (ref 0.61–1.24)
GFR calc Af Amer: 59 mL/min — ABNORMAL LOW (ref >60)
GFR calc non Af Amer: 51 mL/min — ABNORMAL LOW (ref >60)
GLUCOSE: 94 mg/dL (ref 65–99)
Potassium: 4.2 mmol/L (ref 3.5–5.1)
Sodium: 141 mmol/L (ref 135–145)

## 2016-03-21 LAB — CBC
HCT: 39 % (ref 39.0–52.0)
Hemoglobin: 12.6 g/dL — ABNORMAL LOW (ref 13.0–17.0)
MCH: 30.1 pg (ref 26.0–34.0)
MCHC: 32.3 g/dL (ref 30.0–36.0)
MCV: 93.1 fL (ref 78.0–100.0)
Platelets: 227 10*3/uL (ref 150–400)
RBC: 4.19 MIL/uL — ABNORMAL LOW (ref 4.22–5.81)
RDW: 13 % (ref 11.5–15.5)
WBC: 11.4 10*3/uL — ABNORMAL HIGH (ref 4.0–10.5)

## 2016-03-21 MED ORDER — METOPROLOL TARTRATE 25 MG PO TABS: 75.0000 mg | ORAL_TABLET | Freq: Two times a day (BID) | ORAL | Status: DC

## 2016-03-21 MED ORDER — PANTOPRAZOLE SODIUM 40 MG PO TBEC: 40.0000 mg | DELAYED_RELEASE_TABLET | Freq: Every day | ORAL | Status: DC

## 2016-03-21 NOTE — Progress Notes (Signed)
Hospital Problem List     Active Problems:   Peripheral vascular disease ([place])   Left renal artery stenosis [facility])    Patient Profile:   Primary Cardiologist: Dr. Gwenlyn Found  80 yo male w/ PMH of CAD (s/p PCI of LAD and D1 in 2004), OSA, HTN, HLD and PVD who presented to [facility] on 03/20/2018 for renal angiography.   Subjective   Denies any chest pain, abdominal discomfort, or dyspnea overnight. Ambulating to the restroom without difficulty.   Inpatient Medications    . aspirin  81 mg Oral Daily  . atorvastatin  80 mg Oral Daily  . clopidogrel  75 mg Oral Daily  . ezetimibe  10 mg Oral Daily  . metoprolol  50 mg Oral BID  . pantoprazole  40 mg Oral Daily    Vital Signs    Filed Vitals:   03/20/16 1500 03/20/16 1706 03/20/16 1927 03/21/16 0615  BP: 129/41 165/39 150/47 150/54  Pulse: 61 68    Temp:  98 F (36.7 C) 98.2 F (36.8 C) 97.7 F (36.5 C)  TempSrc:  Oral Oral Oral  Resp: 16 18 25 22   Height:      Weight:      SpO2: 96% 96% 94%     Intake/Output Summary (Last 24 hours) at 03/21/16 0742 Last data filed at 03/21/16 0400  Gross per 24 hour  Intake    960 ml  Output   1875 ml  Net   -915 ml   Filed Weights   03/20/16 0602  Weight: 182 lb (82.555 kg)    Physical Exam    General: Well developed, well nourished, male appearing in no acute distress. Head: Normocephalic, atraumatic.  Neck: Supple without bruits, JVD not elevated. Lungs:  Resp regular and unlabored, CTA without wheezing or rales. Heart: RRR, S1, S2, no S3, S4, 3/6 harsh SEM at RUSB; no rub. Abdomen: Soft, non-tender, non-distended with normoactive bowel sounds. No hepatomegaly. No rebound/guarding. No obvious abdominal masses. Extremities: No clubbing, cyanosis, or edema. Distal pedal pulses are 2+ bilaterally. Right groin site stable. Neuro: Alert and oriented X 3. Moves all extremities spontaneously. Psych: Normal affect.  Labs    CBC  Recent Labs  03/21/16 0430  WBC 11.4*    HGB 12.6*  HCT 39.0  MCV 93.1  PLT 940   Basic Metabolic Panel  Recent Labs  03/21/16 0430  NA 141  K 4.2  CL 107  CO2 26  GLUCOSE 94  BUN 16  CREATININE 1.31*  CALCIUM 9.0    Telemetry    NSR, HR in mid-50's - 60's. No atopic events.  ECG    No new tracings.   Cardiac Studies and Radiology    Dg Chest 2 View: 03/09/2016  CLINICAL DATA:  Preop examination for renal stent placement EXAM: CHEST  2 VIEW COMPARISON:  07/07/2013 FINDINGS: Cardiomediastinal silhouette is stable. No acute infiltrate or pleural effusion. No pulmonary edema. Mild degenerative changes thoracic spine. IMPRESSION: No active cardiopulmonary disease. Electronically Signed   By: Lahoma Crocker M.D.   On: 03/09/2016 08:58    Renal Angiography:03/20/2016   Procedures Performed: 1. Abdominal aortography 2. Selective right and left renal artery angiography 3. PTA and stenting left renal artery stenosis   PROCEDURE DESCRIPTION:  The patient was brought to the second floor [place] [facility] cath lab in the the postabsorptive state. He was premedicated with Valium 5 mg by mouth, IV Versed and fentanyl. His right groin was prepped and  shaved in usual sterile fashion. Xylocaine 1% was used for local anesthesia. A 6 French sheath was inserted into the right common femoral artery using standard Seldinger technique. A 5 French pigtail catheter was placed in the mid abdominal aorta. Abdominal aortography was performed using digital subtraction bolus technique. Visipaque dye was used for the entirety of the case. Retrograde aortic pressure was monitored during the case. Selective right and left renal artery angiography was then performed using a short 6 French right Judkins guide catheter.  Angiographic Data:  1: Abdominal aortogram-moderate right, high-grade left renal artery stenosis 2: Right renal artery-40-50% ostial right renal artery stenosis 3: Left renal  artery-90% ostial left renal artery stenosis  IMPRESSION:Mr. Brem has Bilateral renal artery stenosis left much worse than right. The left renal artery stenosis corresponds to the duplex and CT abnormality.It has gotten progressively worse over time. We will proceed with PTA and stenting of the left renal artery stenosis  Procedure Description:the patient received 6000 units of heparin intravenously with an ACT of 235.Total contrast and insert the patient was 85 mL. I crossed the ostial left renal artery stenosis with a 014 stabilizer wire and predilated the lesion with a 4 mm x 2 cm balloon. I then placed a 6 mm x 15 mm long balloon expandable stent at the origin of the left renal artery and deployed the stent at 12 atm. I postdilated the stent with the same balloon at the origin at 14 atm resulting in reduction of a 90% ostial left renal artery stenosis to 0% residual. The patient tolerated the procedure well. The guidewire and catheter were removed and the sheath was secured. The patient was already on aspirin and Plavix. He received 10 mg of IV hydralazine. The sheath will be removed and pressure held once the H&P this demonstrated to be below 170. He'll be hydrated overnight and discharged home in the morning.  Final Impression: successful left renal artery PTA and stenting using a 6 mm x 15 mm long balloon expandable stent reducing a 90% ostial left renal artery stenosis to 0% residual. The patient will be will be discharged home tomorrow. We will get renal Dopplers in our office in [place] next week and I will see him back in 2-3 weeks thereafter. He left the lab in stable condition.  Assessment & Plan    1. PVD/ Left Renal Artery Stenosis - CT angiography performed on 02/04/16 showed high-grade ostial left renal artery stenosis with probable right renal artery stenosis as well. It was recommended he have renal angiography. - he underwent the procedure on 03/20/2016 which showed 40-50% ostial  right renal artery stenosis and 90% ostial left renal artery stenosis. Successful PTA and stenting was performed using a 6 mm x 15 mm long balloon expandable stent reducing a 90% ostial left renal artery stenosis to 0% residual stenosis.  - he will be on DAPT with ASA and Plavix. - will obtain follow-up renal dopplers in the office next week and follow-up with Dr. Gwenlyn Found in 2-3 weeks afterwards.  2. CAD - no recent anginal symptoms - continue ASA, Plavix, statin, Zetia, and BB.  3. HLD - continue statin and Zetia  4. Stage 3 CKD - pre-procedure creatinine was 1.21, at 1.31 today.   Asked the patient to ambulate down the hallway. Likely stable for discharge later this morning.  Arna Medici , PA-C 7:42 AM 03/21/2016 Pager: [phone]  I have examined the patient and reviewed assessment and plan and discussed with  patient.  Agree with above as stated.  Right groin stable.  Restart Losartan.  Watch for low BP at home.  F/u renal Doppler.  Continue DAPT. Discharge later today.  Larae Grooms

## 2016-03-21 NOTE — Care Management Note (Signed)
Case Management Note  Patient Details  Name: Eric [name] MRN: [id number] Date of Birth: July 20, 1936  Subjective/Objective:    PTA and stenting left renal artery stenosis                Action/Plan: Discharge Planning: AVS reviewed: Chart reviewed. Lives at home with wife, Eric [name] [phone]. No NCM needs identified.   PCP - Shon Baton MD   Expected Discharge Date:  03/21/2016             Expected Discharge Plan:  Home/Self Care  In-House Referral:  NA  Discharge planning Services  CM Consult  Post Acute Care Choice:  NA Choice offered to:  NA  DME Arranged:  N/A DME Agency:  NA  HH Arranged:  NA HH Agency:  NA  Status of Service:  Completed, signed off  Medicare Important Message Given:    Date Medicare IM Given:    Medicare IM give by:    Date Additional Medicare IM Given:    Additional Medicare Important Message give by:     If discussed at [place] of Stay Meetings, dates discussed:    Additional Comments:  Erenest Rasher, RN 03/21/2016, 10:34 AM

## 2016-03-21 NOTE — Discharge Summary (Signed)
Discharge Summary    Patient ID: Eric [name],  MRN: [id number], DOB/AGE: March 10, 1936 80 y.o.  Admit date: 03/20/2016 Discharge date: 03/21/2016  Primary Care Provider: [name] Primary Cardiologist: Dr. Gwenlyn Found  Discharge Diagnoses    Active Problems:   Peripheral vascular disease [facility])   Left renal artery stenosis ([place])   History of Present Illness     [name] is a 80 y.o. male with past medical history of CAD (s/p PCI of LAD and D1 in 2004), OSA, HTN, HLD and PVD. He had CT angiography performed on 02/04/16 which showed high-grade ostial left renal artery stenosis with probable right renal artery stenosis as well. It was recommended he have renal angiography and he presented to [facility] on 03/20/2016 for the procedure.  Hospital Course     Consultants: None  His angiography showed 40-50% ostial right renal artery stenosis and 90% ostial left renal artery stenosis. Successful PTA and stenting was performed using a 6 mm x 15 mm long balloon expandable stent reducing a 90% ostial left renal artery stenosis to 0% residual stenosis. He will be on ASA and Plavix. He tolerated the procedure well and no complications were noted.  The following morning, he denied any anginal symptoms. His right groin site was stable with minimal ecchymosis. He ambulated down the hallway without any difficulty. Telemetry showed no abnormal events. Labs showed a stable creatinine of 1.31 (1.2 - 1.5 pre-procedure) and Hgb of 12.6 (14.4 pre-procedure).  He was last examined by Dr. Irish Lack and deemed stable for discharge. He will have follow-up renal dopplers in the office next week and follow-up with Dr. Gwenlyn Found afterwards. He was discharged home in stable condition.   _____________  Discharge Vitals Blood pressure 143/44, pulse 75, temperature 98 F (36.7 C), temperature source Oral, resp. rate 15, height 5' 7"  (1.702 m), weight 182 lb (82.555 kg), SpO2 96 %.  Filed Weights   03/20/16 0602    Weight: 182 lb (82.555 kg)    Labs & Radiologic Studies     CBC  Recent Labs  03/21/16 0430  WBC 11.4*  HGB 12.6*  HCT 39.0  MCV 93.1  PLT 932   Basic Metabolic Panel  Recent Labs  03/21/16 0430  NA 141  K 4.2  CL 107  CO2 26  GLUCOSE 94  BUN 16  CREATININE 1.31*  CALCIUM 9.0    Dg Chest 2 View: 03/09/2016  CLINICAL DATA:  Preop examination for renal stent placement EXAM: CHEST  2 VIEW COMPARISON:  07/07/2013 FINDINGS: Cardiomediastinal silhouette is stable. No acute infiltrate or pleural effusion. No pulmonary edema. Mild degenerative changes thoracic spine. IMPRESSION: No active cardiopulmonary disease. Electronically Signed   By: Lahoma Crocker M.D.   On: 03/09/2016 08:58    Diagnostic Studies/Procedures    Renal Angiography: 03/20/2016  Procedures Performed: 1. Abdominal aortography 2. Selective right and left renal artery angiography 3. PTA and stenting left renal artery stenosis   PROCEDURE DESCRIPTION:  The patient was brought to the second floor [place] [facility]iac cath lab in the the postabsorptive state. He was premedicated with Valium 5 mg by mouth, IV Versed and fentanyl. His right groin was prepped and shaved in usual sterile fashion. Xylocaine 1% was used for local anesthesia. A 6 French sheath was inserted into the right common femoral artery using standard Seldinger technique. A 5 French pigtail catheter was placed in the mid abdominal aorta. Abdominal aortography was performed using digital subtraction bolus technique. Visipaque  dye was used for the entirety of the case. Retrograde aortic pressure was monitored during the case. Selective right and left renal artery angiography was then performed using a short 6 French right Judkins guide catheter.  Angiographic Data:  1: Abdominal aortogram-moderate right, high-grade left renal artery stenosis 2: Right renal artery-40-50% ostial right renal artery  stenosis 3: Left renal artery-90% ostial left renal artery stenosis  IMPRESSION:Mr. Coble has Bilateral renal artery stenosis left much worse than right. The left renal artery stenosis corresponds to the duplex and CT abnormality.It has gotten progressively worse over time. We will proceed with PTA and stenting of the left renal artery stenosis  Procedure Description:the patient received 6000 units of heparin intravenously with an ACT of 235.Total contrast and insert the patient was 85 mL. I crossed the ostial left renal artery stenosis with a 014 stabilizer wire and predilated the lesion with a 4 mm x 2 cm balloon. I then placed a 6 mm x 15 mm long balloon expandable stent at the origin of the left renal artery and deployed the stent at 12 atm. I postdilated the stent with the same balloon at the origin at 14 atm resulting in reduction of a 90% ostial left renal artery stenosis to 0% residual. The patient tolerated the procedure well. The guidewire and catheter were removed and the sheath was secured. The patient was already on aspirin and Plavix. He received 10 mg of IV hydralazine. The sheath will be removed and pressure held once the H&P this demonstrated to be below 170. He'll be hydrated overnight and discharged home in the morning.  Final Impression: successful left renal artery PTA and stenting using a 6 mm x 15 mm long balloon expandable stent reducing a 90% ostial left renal artery stenosis to 0% residual. The patient will be will be discharged home tomorrow. We will get renal Dopplers in our office in [place] next week and I will see him back in 2-3 weeks thereafter. He left the lab in stable condition.  Disposition   Pt is being discharged home today in good condition.  Follow-up Plans & Appointments    Follow-up Information    Follow up with Quay Burow, MD On 04/26/2016.   Specialties:  Cardiology, Radiology   Why:  Cardiology Follow-up on 04/26/2016 at 9:15AM.    Contact  information:   [address] [place] [address] [phone]       Follow up with [facility].   Specialty:  Cardiology   Why:  Follow-Up Imaging Studies on 03/30/2016 at 11:00AM. Nothing to eat after midnight. Take your medications with a sips if water.   Contact information:   [address] [place] [phone]     Discharge Instructions    Diet - low sodium heart healthy    Complete by:  As directed      Discharge instructions    Complete by:  As directed   PLEASE REMEMBER TO BRING ALL OF YOUR MEDICATIONS TO EACH OF YOUR FOLLOW-UP OFFICE VISITS.  PLEASE ATTEND ALL SCHEDULED FOLLOW-UP APPOINTMENTS.   Activity: Increase activity slowly as tolerated. You may shower, but no soaking baths (or swimming) for 1 week. No driving for 24 hours. No lifting over 5 lbs for 1 week. No sexual activity for 1 week.   You May Return to Work: in 1 week (if applicable)  Wound Care: You may wash cath site gently with soap and water. Keep cath site clean  and dry. If you notice pain, swelling, bleeding or pus at your cath site, please call [phone].     Increase activity slowly    Complete by:  As directed            Discharge Medications   Discharge Medication List as of 03/21/2016  9:55 AM    START taking these medications   Details  pantoprazole (PROTONIX) 40 MG tablet Take 1 tablet (40 mg total) by mouth daily., Starting 03/21/2016, Until Discontinued, Normal      CONTINUE these medications which have NOT CHANGED   Details  aspirin 81 MG tablet Take 81 mg by mouth daily.  , Until Discontinued, Historical Med    atorvastatin (LIPITOR) 80 MG tablet TAKE 1 TABLET BY MOUTH EVERY DAY, Normal    clopidogrel (PLAVIX) 75 MG tablet TAKE 1 TABLET EVERY DAY, Normal    diazepam (VALIUM) 5 MG tablet TAKE 1 TABLET EVERY 8 HOURS AS NEEDED, Phone In    meclizine (ANTIVERT) 25 MG tablet TAKE ONE TABLET BY MOUTH EVERY 8 HOURS AS  NEEDED FOR DIZZINESS, Normal    metoprolol (LOPRESSOR) 50 MG tablet TAKE 1.5 TABLETS (75 MG TOTAL) BY MOUTH 2 (TWO) TIMES DAILY., Normal    ZETIA 10 MG tablet TAKE 1 TABLET (10 MG TOTAL) BY MOUTH DAILY., Normal    nitroGLYCERIN (NITROSTAT) 0.4 MG SL tablet Place 1 tablet (0.4 mg total) under the tongue every 5 (five) minutes as needed for chest pain., Starting 05/22/2014, Until Discontinued, Normal      STOP taking these medications     lansoprazole (PREVACID) 15 MG capsule      furosemide (LASIX) 20 MG tablet           Allergies Allergies  Allergen Reactions  . Niaspan [Niacin Er]   . Sertraline Hcl     REACTION: disorientation  . Tetanus Toxoids      Outstanding Labs/Studies   Renal Artery Duplex Next Week  Duration of Discharge Encounter   Greater than 30 minutes including physician time.  Signed, Erma Heritage, PA-C 03/21/2016, 11:34 AM  I have examined the patient and reviewed assessment and plan and discussed with patient. Agree with above as stated. Right groin stable. Palpable 1+ right DP pulse. Restart Losartan. Watch for low BP at home. F/u renal Doppler. Continue DAPT. Discharge later today.  Larae Grooms

## 2016-03-22 ENCOUNTER — Telehealth: Payer: Self-pay | Admitting: Cardiovascular Disease

## 2016-03-22 NOTE — Telephone Encounter (Signed)
Please put patient back on home meds.

## 2016-03-22 NOTE — Telephone Encounter (Signed)
New message      Pt was discharged from hosp yesterday.  Wife has questions to ask nurse

## 2016-03-22 NOTE — Telephone Encounter (Signed)
Wife called back w/ questions. I verified f/u appt information. To have renal US on 6/29 and return OV w/ Dr. Gwenlyn Found 7/26.  Also, she had question related to meds not on discharge list -- Notes pt had not been taking Losartan prior to going into hospital and had not received instruction on whether to resume. I reviewed recent notes. From lab notes from 03/09/16, pt was instructed to hold Lasix and Losartan. He had a recheck on his BMET prior to going in for cath. Is it OK for patient to resume these medications?

## 2016-03-22 NOTE — Telephone Encounter (Signed)
Discussed w wife. Advised to resume meds as directed by Dr. Gwenlyn Found.  Advised to call if new concerns, and otherwise to follow up for scheduled tests/visits. She is aware of recommendations and voiced thanks.  She is aware we will call if resutls abnormal. No further concerns at this time.

## 2016-03-30 ENCOUNTER — Telehealth: Payer: Self-pay | Admitting: *Deleted

## 2016-03-30 ENCOUNTER — Ambulatory Visit (HOSPITAL_COMMUNITY)
Admission: RE | Admit: 2016-03-30 | Discharge: 2016-03-30 | Disposition: A | Discharge status: Home / Self Care | Payer: Medicare Other | Source: Ambulatory Visit | Attending: Cardiology | Admitting: Cardiology

## 2016-03-30 DIAGNOSIS — G4733 Obstructive sleep apnea (adult) (pediatric): Secondary | ICD-10-CM | POA: Diagnosis not present

## 2016-03-30 DIAGNOSIS — E785 Hyperlipidemia, unspecified: Secondary | ICD-10-CM | POA: Diagnosis not present

## 2016-03-30 DIAGNOSIS — F419 Anxiety disorder, unspecified: Secondary | ICD-10-CM | POA: Insufficient documentation

## 2016-03-30 DIAGNOSIS — I2510 Atherosclerotic heart disease of native coronary artery without angina pectoris: Secondary | ICD-10-CM | POA: Insufficient documentation

## 2016-03-30 DIAGNOSIS — I10 Essential (primary) hypertension: Secondary | ICD-10-CM | POA: Diagnosis not present

## 2016-03-30 DIAGNOSIS — I701 Atherosclerosis of renal artery: Secondary | ICD-10-CM | POA: Diagnosis not present

## 2016-03-30 DIAGNOSIS — I739 Peripheral vascular disease, unspecified: Secondary | ICD-10-CM | POA: Insufficient documentation

## 2016-03-30 NOTE — Telephone Encounter (Signed)
Pt here for renal dopplers and requested his bp be taken. bp 145/54 pulse 55. He is concerned because recent checks at [facility] have been 108/48. They think his bp maybe getting too low. They are unable to check his bp at home, their cuff is broken. Their f/u appt is in July. Will forward this information to dr berry for his review

## 2016-03-31 ENCOUNTER — Telehealth: Payer: Self-pay | Admitting: *Deleted

## 2016-03-31 ENCOUNTER — Telehealth: Payer: Self-pay | Admitting: Cardiovascular Disease

## 2016-03-31 DIAGNOSIS — I701 Atherosclerosis of renal artery: Secondary | ICD-10-CM

## 2016-03-31 NOTE — Telephone Encounter (Signed)
Advised wife and she will just wait until July. Advised to call back if she changes her mind     Lorretta Harp, MD at 03/31/2016 7:56 AM     Status: Signed       Expand All Collapse All   Can wait until July. If not, he can be put on Kristen's schedule            Cristopher Estimable, RN at 03/30/2016 11:39 AM     Status: Signed       Expand All Collapse All   Pt here for renal dopplers and requested his bp be taken. bp 145/54 pulse 55. He is concerned because recent checks at [facility] have been 108/48. They think his bp maybe getting too low. They are unable to check his bp at home, their cuff is broken. Their f/u appt is in July. Will forward this information to dr berry for his review

## 2016-03-31 NOTE — Telephone Encounter (Signed)
Eric [name] at 03/31/2016 2:30 PM     Status: Signed       Expand All Collapse All   Advised wife and she will just wait until July. Advised to call back if she changes her mind

## 2016-03-31 NOTE — Telephone Encounter (Signed)
New message    The wife wants to know if she needs to keep of the pt's blood pressure the wife was never instructed by the Md to keep a check of the blood pressure and now she is confused on what she needs to do for the pt.

## 2016-03-31 NOTE — Telephone Encounter (Signed)
Can wait until July. If not, he can be put on Kristen's schedule

## 2016-03-31 NOTE — Telephone Encounter (Signed)
-----   Message from Lorretta Harp, MD sent at 03/31/2016  8:05 AM EDT ----- Improved Left renal artery velocities s/p Left renal artery stenting. Repeat 6 months

## 2016-03-31 NOTE — Telephone Encounter (Signed)
Notes Recorded by Lorretta Harp, MD on 03/31/2016 at 8:05 AM Improved Left renal artery velocities s/p Left renal artery stenting. Repeat 6 months  Ordered entered.

## 2016-04-26 ENCOUNTER — Encounter: Payer: Self-pay | Admitting: Cardiovascular Disease

## 2016-04-26 ENCOUNTER — Ambulatory Visit (INDEPENDENT_AMBULATORY_CARE_PROVIDER_SITE_OTHER): Payer: Medicare Other | Admitting: Cardiovascular Disease

## 2016-04-26 ENCOUNTER — Ambulatory Visit (HOSPITAL_COMMUNITY)
Admission: RE | Admit: 2016-04-26 | Discharge: 2016-04-26 | Disposition: A | Discharge status: Home / Self Care | Payer: Medicare Other | Source: Ambulatory Visit | Attending: Urology | Admitting: Urology

## 2016-04-26 VITALS — BP 142/66 | Ht 67.0 in | Wt 187.0 lb

## 2016-04-26 DIAGNOSIS — F419 Anxiety disorder, unspecified: Secondary | ICD-10-CM | POA: Diagnosis not present

## 2016-04-26 DIAGNOSIS — E785 Hyperlipidemia, unspecified: Secondary | ICD-10-CM

## 2016-04-26 DIAGNOSIS — G4733 Obstructive sleep apnea (adult) (pediatric): Secondary | ICD-10-CM | POA: Diagnosis not present

## 2016-04-26 DIAGNOSIS — I10 Essential (primary) hypertension: Secondary | ICD-10-CM | POA: Diagnosis not present

## 2016-04-26 DIAGNOSIS — I701 Atherosclerosis of renal artery: Secondary | ICD-10-CM | POA: Diagnosis not present

## 2016-04-26 DIAGNOSIS — I6529 Occlusion and stenosis of unspecified carotid artery: Secondary | ICD-10-CM

## 2016-04-26 DIAGNOSIS — I2510 Atherosclerotic heart disease of native coronary artery without angina pectoris: Secondary | ICD-10-CM | POA: Insufficient documentation

## 2016-04-26 DIAGNOSIS — I350 Nonrheumatic aortic (valve) stenosis: Secondary | ICD-10-CM | POA: Diagnosis not present

## 2016-04-26 DIAGNOSIS — I6523 Occlusion and stenosis of bilateral carotid arteries: Secondary | ICD-10-CM

## 2016-04-26 NOTE — Patient Instructions (Signed)
Medication Instructions:  Your physician recommends that you continue on your current medications as directed. Please refer to the Current Medication list given to you today.   Labwork: Labwork will be requested from your primary care physician.   Testing/Procedures: Your physician has requested that you have a carotid duplex. This test is an ultrasound of the carotid arteries in your neck. It looks at blood flow through these arteries that supply the brain with blood. Allow one hour for this exam. There are no restrictions or special instructions. IN 68 MONTHS  Your physician has requested that you have a renal artery duplex. During this test, an ultrasound is used to evaluate blood flow to the kidneys. Allow one hour for this exam. Do not eat after midnight the day before and avoid carbonated beverages. Take your medications as you usually do.   IN 6 MONTHS    Follow-Up: Your physician wants you to follow-up in: [place]. You will receive a reminder letter in the mail two months in advance. If you don't receive a letter, please call our office to schedule the follow-up appointment.   If you need a refill on your cardiac medications before your next appointment, please call your pharmacy.

## 2016-04-26 NOTE — Assessment & Plan Note (Signed)
History of moderate right internal carotid artery stenosis by 2+ ultrasound recently checked 04/26/16. This will be repeated in one year

## 2016-04-26 NOTE — Assessment & Plan Note (Signed)
Mr. Eric [name] has a soft outflow tract murmur and at least moderate aortic stenosis by 2-D echocardiography performed 10/14/15. His aortic valve area measured 1.36 cm2 with a peak gradient of 40 and a mean of 25 mmHg. This will be repeated on an annual basis.

## 2016-04-26 NOTE — Progress Notes (Signed)
04/26/2016 Eric [name]   06-06-1936  [id number]  Primary Physician [name], MD Primary Cardiologist: Lorretta Harp MD Renae Gloss  HPI:  The patient is a 80 year old mildly to moderately overweight married Caucasian male father of 20, grandfather to 8 grandchildren who is accompanied by his wife today as usual. I last saw him in the office 03/01/16.Marland Kitchen He has a history of CAD and PVOD. I stented his LAD and diagonal branch back on July 14, 2003 and subsequent stenting by Dr. Melvern Banker July 31, 2003. He had mild bilateral renal artery stenosis at cath with blockages in the 40% range as well as moderate right internal carotid artery stenosis by duplex ultrasound. He was neurologic asymptomatic. His other problems include hypertension and hyperlipidemia. He has obstructive sleep apnea followed by Dr. Claiborne Billings. His most recent lab work revealed a total cholesterol of 125, LDL of 58 and HDL of 36. Carotid Dopplers performed in September 2016 revealed moderate right ICA stenosis, unchanged from a prior study and renal Dopplers performed 01/05/16 showed progression of bilateral renal artery stenosis. CT angiography performed 02/04/16 that showed high-grade ostial left renal artery stenosis with probable right renal artery stenosis as well. The patient underwent left renal artery PTA and stenting using a 6 mm x 15 mm long balloon expandable stent with excellent angiographic result. His follow-up Dopplers performed on 03/30/16 showed marked improvement in his velocities and renal aortic ratios.    Current Outpatient Prescriptions  Medication Sig Dispense Refill  . aspirin 81 MG tablet Take 81 mg by mouth daily.      Marland Kitchen atorvastatin (LIPITOR) 80 MG tablet TAKE 1 TABLET BY MOUTH EVERY DAY 30 tablet 6  . clopidogrel (PLAVIX) 75 MG tablet TAKE 1 TABLET EVERY DAY 90 tablet 3  . diazepam (VALIUM) 5 MG tablet TAKE 1 TABLET EVERY 8 HOURS AS NEEDED (Patient taking differently: Take 5 mg by mouth 2  (two) times daily. TAKE 1 TABLET EVERY 8 HOURS AS NEEDED) 90 tablet 3  . losartan (COZAAR) 100 MG tablet Take 100 mg by mouth daily.  2  . meclizine (ANTIVERT) 25 MG tablet TAKE ONE TABLET BY MOUTH EVERY 8 HOURS AS NEEDED FOR DIZZINESS 90 tablet 2  . metoprolol (LOPRESSOR) 50 MG tablet TAKE 1.5 TABLETS (75 MG TOTAL) BY MOUTH 2 (TWO) TIMES DAILY. 270 tablet 2  . nitroGLYCERIN (NITROSTAT) 0.4 MG SL tablet Place 1 tablet (0.4 mg total) under the tongue every 5 (five) minutes as needed for chest pain. 25 tablet 4  . pantoprazole (PROTONIX) 40 MG tablet Take 1 tablet (40 mg total) by mouth daily. 90 tablet 3  . ZETIA 10 MG tablet TAKE 1 TABLET (10 MG TOTAL) BY MOUTH DAILY. 30 tablet 11   No current facility-administered medications for this visit.     Allergies  Allergen Reactions  . Niaspan [Niacin Er]   . Sertraline Hcl     REACTION: disorientation  . Tetanus Toxoids     Social History   Social History  . Marital status: Married    Spouse name: ruth  . Number of children: 4  . Years of education: N/A   Occupational History  . Retired    Social History Main Topics  . Smoking status: Former Smoker    Packs/day: 1.00    Years: 15.00    Types: Cigarettes    Quit date: 10/02/1964  . Smokeless tobacco: Never Used  . Alcohol use Yes     Comment: 03/20/2016 "  haven't had a drink since the 1990s"  . Drug use: No  . Sexual activity: Not Currently   Other Topics Concern  . Not on file   Social History Narrative  . No narrative on file     Review of Systems: General: negative for chills, fever, night sweats or weight changes.  Cardiovascular: negative for chest pain, dyspnea on exertion, edema, orthopnea, palpitations, paroxysmal nocturnal dyspnea or shortness of breath Dermatological: negative for rash Respiratory: negative for cough or wheezing Urologic: negative for hematuria Abdominal: negative for nausea, vomiting, diarrhea, bright red blood per rectum, melena, or  hematemesis Neurologic: negative for visual changes, syncope, or dizziness All other systems reviewed and are otherwise negative except as noted above.    Blood pressure (!) 142/66, height 5\' 7"  (1.702 m), weight 187 lb (84.8 kg).  General appearance: alert and no distress Neck: no adenopathy, no JVD, supple, symmetrical, trachea midline, thyroid not enlarged, symmetric, no tenderness/mass/nodules and Soft bilateral carotid bruits Lungs: clear to auscultation bilaterally Heart: Soft outflow tract murmur Extremities: extremities normal, atraumatic, no cyanosis or edema  EKG sinus bradycardia 51 without ST or T-wave changes. I personally reviewed this EKG  ASSESSMENT AND PLAN:   Bilateral renal artery stenosis (HCC) History of bilateral renal artery stenosis left greater than right. Status post left renal artery stenting by myself on 03/20/16 reducing a 90% ostial left renal artery stenosis to 0% residual using a 6 mm x 15 mm long balloon expandable stent. His follow-up renal Dopplers showed marked improvement in his velocities and renal aortic ratio. His blood pressures are under good control. He remains on aspirin and Plavix. We will continue to follow him noninvasively on a semiannual basis.  CEREBROVASCULAR DISEASE History of moderate right internal carotid artery stenosis by 2+ ultrasound recently checked 04/26/16. This will be repeated in one year      Lorretta Harp MD [facility], [facility] 04/26/2016 9:24 AM

## 2016-04-26 NOTE — Assessment & Plan Note (Signed)
History of bilateral renal artery stenosis left greater than right. Status post left renal artery stenting by myself on 03/20/16 reducing a 90% ostial left renal artery stenosis to 0% residual using a 6 mm x 15 mm long balloon expandable stent. His follow-up renal Dopplers showed marked improvement in his velocities and renal aortic ratio. His blood pressures are under good control. He remains on aspirin and Plavix. We will continue to follow him noninvasively on a semiannual basis.

## 2016-04-27 NOTE — Addendum Note (Signed)
Addended by: Vennie Homans on: 04/27/2016 08:47 AM   Modules accepted: Orders

## 2016-05-07 ENCOUNTER — Other Ambulatory Visit: Payer: Self-pay | Admitting: Cardiovascular Disease

## 2016-05-08 NOTE — Telephone Encounter (Signed)
Rx(s) sent to pharmacy electronically.  

## 2016-06-17 DIAGNOSIS — Z23 Encounter for immunization: Secondary | ICD-10-CM | POA: Diagnosis not present

## 2016-08-08 DIAGNOSIS — D485 Neoplasm of uncertain behavior of skin: Secondary | ICD-10-CM | POA: Diagnosis not present

## 2016-08-08 DIAGNOSIS — D1801 Hemangioma of skin and subcutaneous tissue: Secondary | ICD-10-CM | POA: Diagnosis not present

## 2016-08-08 DIAGNOSIS — L821 Other seborrheic keratosis: Secondary | ICD-10-CM | POA: Diagnosis not present

## 2016-08-08 DIAGNOSIS — Z85828 Personal history of other malignant neoplasm of skin: Secondary | ICD-10-CM | POA: Diagnosis not present

## 2016-09-04 ENCOUNTER — Other Ambulatory Visit: Payer: Self-pay | Admitting: Cardiovascular Disease

## 2016-09-05 NOTE — Telephone Encounter (Signed)
Rx(s) sent to pharmacy electronically.  

## 2016-10-16 ENCOUNTER — Ambulatory Visit (HOSPITAL_BASED_OUTPATIENT_CLINIC_OR_DEPARTMENT_OTHER): Payer: Medicare Other

## 2016-10-16 ENCOUNTER — Other Ambulatory Visit: Payer: Self-pay

## 2016-10-16 ENCOUNTER — Ambulatory Visit (HOSPITAL_COMMUNITY)
Admission: RE | Admit: 2016-10-16 | Discharge: 2016-10-16 | Disposition: A | Discharge status: Home / Self Care | Payer: Medicare Other | Source: Ambulatory Visit | Attending: Cardiology | Admitting: Cardiology

## 2016-10-16 DIAGNOSIS — I2510 Atherosclerotic heart disease of native coronary artery without angina pectoris: Secondary | ICD-10-CM | POA: Diagnosis not present

## 2016-10-16 DIAGNOSIS — I10 Essential (primary) hypertension: Secondary | ICD-10-CM | POA: Diagnosis not present

## 2016-10-16 DIAGNOSIS — Z87891 Personal history of nicotine dependence: Secondary | ICD-10-CM | POA: Diagnosis not present

## 2016-10-16 DIAGNOSIS — I701 Atherosclerosis of renal artery: Secondary | ICD-10-CM | POA: Diagnosis not present

## 2016-10-16 DIAGNOSIS — I739 Peripheral vascular disease, unspecified: Secondary | ICD-10-CM | POA: Insufficient documentation

## 2016-10-16 DIAGNOSIS — E785 Hyperlipidemia, unspecified: Secondary | ICD-10-CM | POA: Insufficient documentation

## 2016-10-16 DIAGNOSIS — I350 Nonrheumatic aortic (valve) stenosis: Secondary | ICD-10-CM | POA: Diagnosis not present

## 2016-10-16 DIAGNOSIS — I080 Rheumatic disorders of both mitral and aortic valves: Secondary | ICD-10-CM | POA: Insufficient documentation

## 2016-10-17 ENCOUNTER — Other Ambulatory Visit: Payer: Self-pay | Admitting: Cardiovascular Disease

## 2016-10-17 DIAGNOSIS — I701 Atherosclerosis of renal artery: Secondary | ICD-10-CM

## 2016-10-17 DIAGNOSIS — I350 Nonrheumatic aortic (valve) stenosis: Secondary | ICD-10-CM

## 2016-12-18 ENCOUNTER — Other Ambulatory Visit: Payer: Self-pay | Admitting: Cardiovascular Disease

## 2017-01-11 DIAGNOSIS — Z0000 Encounter for general adult medical examination without abnormal findings: Secondary | ICD-10-CM | POA: Diagnosis not present

## 2017-02-05 DIAGNOSIS — H524 Presbyopia: Secondary | ICD-10-CM | POA: Diagnosis not present

## 2017-02-05 DIAGNOSIS — H2513 Age-related nuclear cataract, bilateral: Secondary | ICD-10-CM | POA: Diagnosis not present

## 2017-02-05 DIAGNOSIS — H25013 Cortical age-related cataract, bilateral: Secondary | ICD-10-CM | POA: Diagnosis not present

## 2017-02-05 DIAGNOSIS — H5203 Hypermetropia, bilateral: Secondary | ICD-10-CM | POA: Diagnosis not present

## 2017-02-06 DIAGNOSIS — D1801 Hemangioma of skin and subcutaneous tissue: Secondary | ICD-10-CM | POA: Diagnosis not present

## 2017-02-06 DIAGNOSIS — Z85828 Personal history of other malignant neoplasm of skin: Secondary | ICD-10-CM | POA: Diagnosis not present

## 2017-02-06 DIAGNOSIS — L821 Other seborrheic keratosis: Secondary | ICD-10-CM | POA: Diagnosis not present

## 2017-02-06 DIAGNOSIS — D485 Neoplasm of uncertain behavior of skin: Secondary | ICD-10-CM | POA: Diagnosis not present

## 2017-03-10 ENCOUNTER — Other Ambulatory Visit: Payer: Self-pay | Admitting: Student

## 2017-03-22 IMAGING — CR DG CHEST 2V
2 series · 2 of 2 positions shown · non-contrast
Comparison: 07/07/2013

CLINICAL DATA: Preop examination for renal stent placement

EXAM:
CHEST  2 VIEW

[w chest pa]
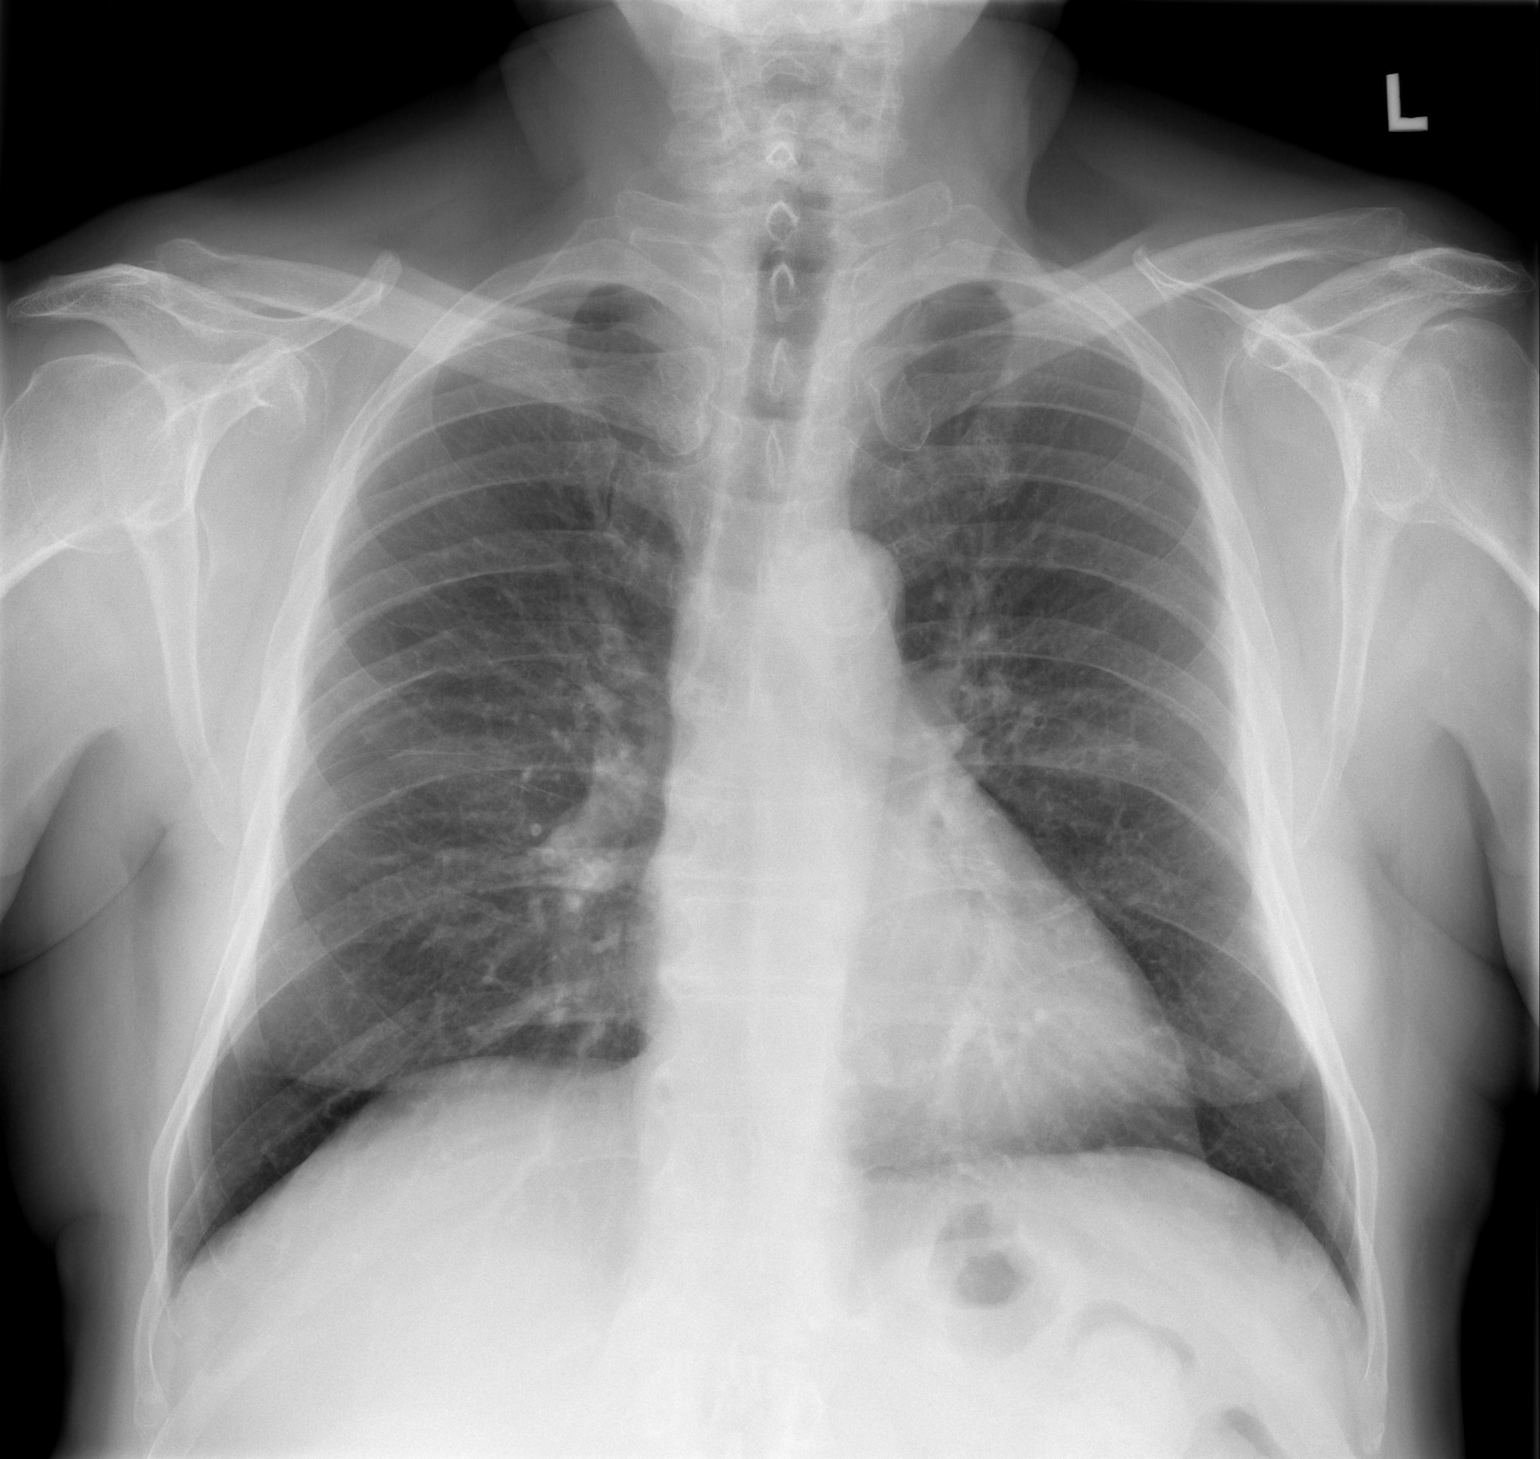

[w chest lat]
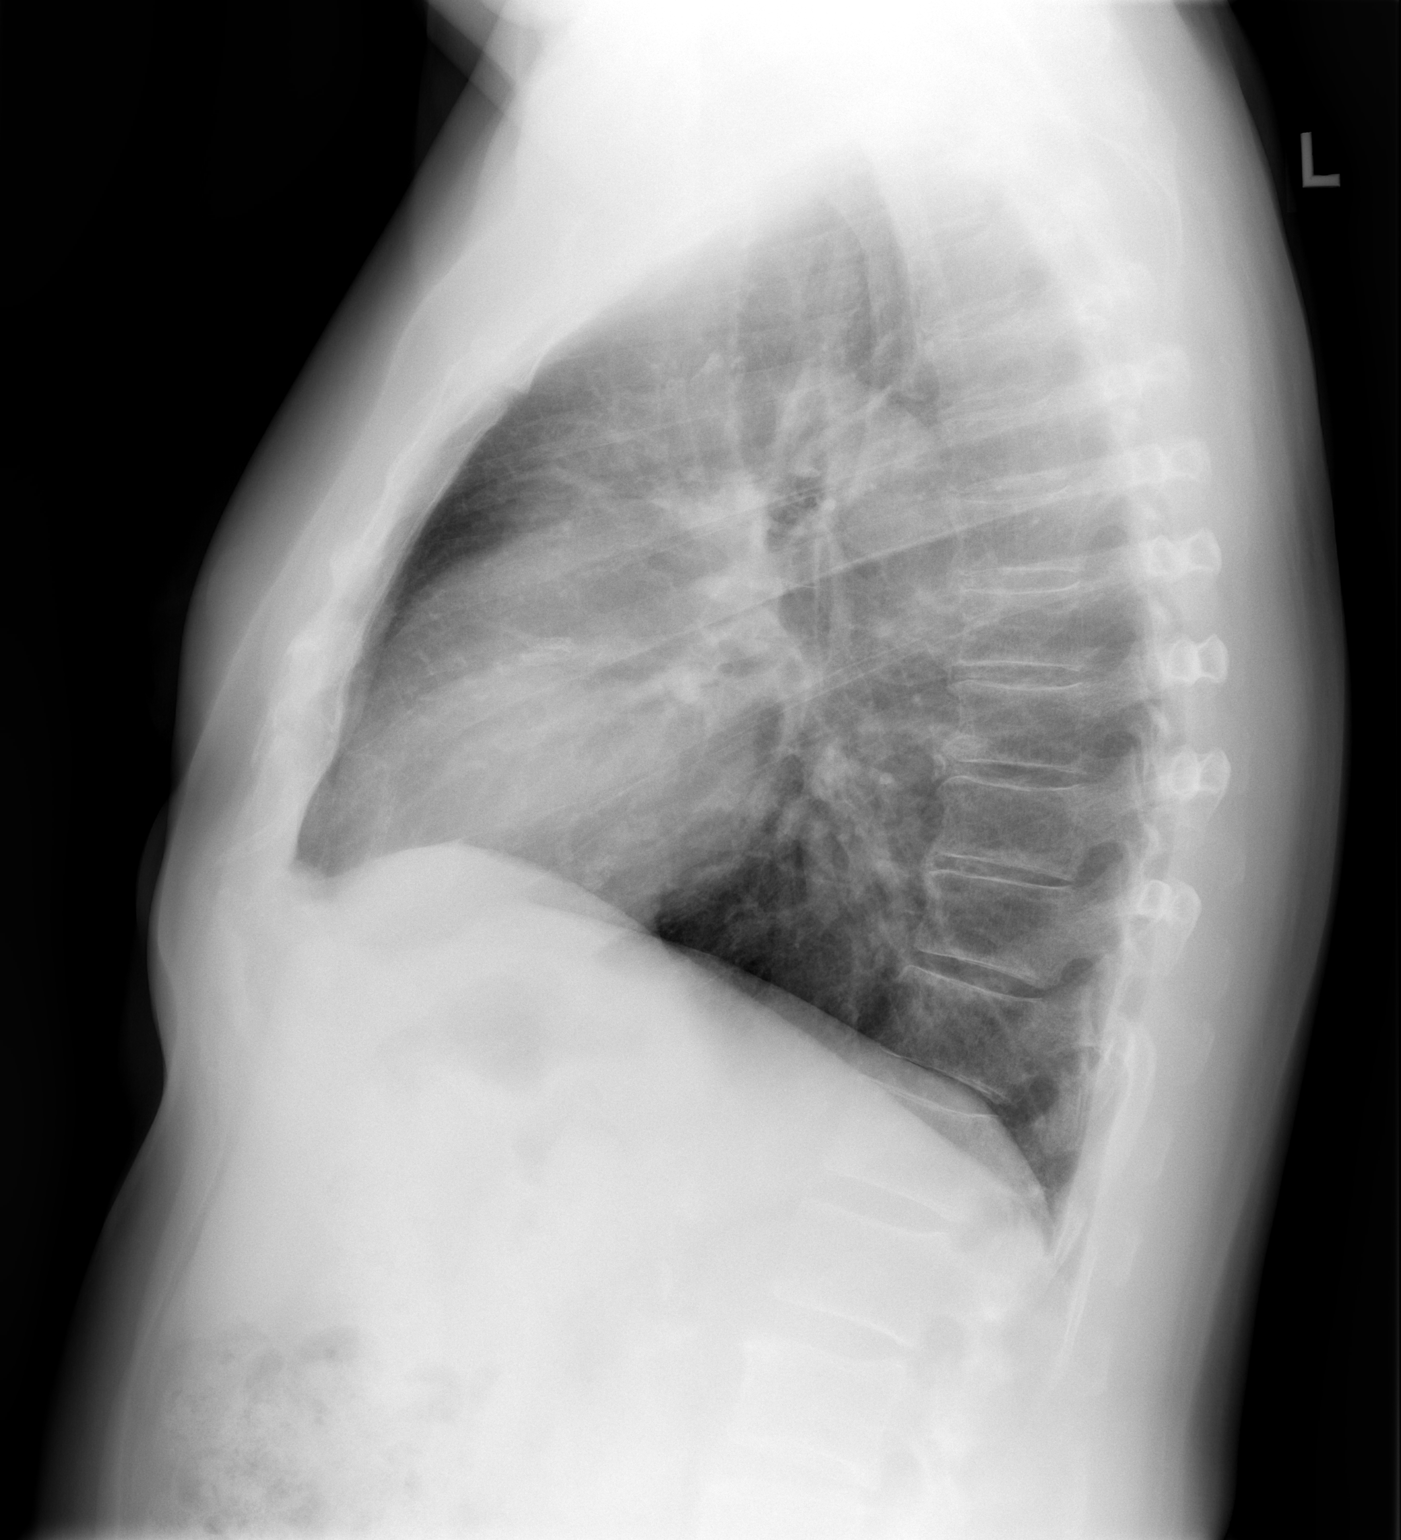

[2 of 2 positions shown; findings below may reference images not displayed]

FINDINGS: Cardiomediastinal silhouette is stable. No acute infiltrate or
pleural effusion. No pulmonary edema. Mild degenerative changes
thoracic spine.
IMPRESSION: No active cardiopulmonary disease.

## 2017-04-12 ENCOUNTER — Other Ambulatory Visit: Payer: Self-pay

## 2017-04-12 MED ORDER — EZETIMIBE 10 MG PO TABS: 10.0000 mg | ORAL_TABLET | Freq: Every day | ORAL | 3 refills | Status: DC

## 2017-04-12 NOTE — Addendum Note (Signed)
Addended by: Derl Barrow on: 04/12/2017 02:16 PM   Modules accepted: Orders

## 2017-04-12 NOTE — Telephone Encounter (Signed)
Pt called stating that his pharmacy has not received his refill request. Pt's medication was refilled today, but sent to wrong pharmacy. I resent pt's medication to the correct pharmacy as requested. Confirmation received.

## 2017-04-23 ENCOUNTER — Ambulatory Visit (HOSPITAL_COMMUNITY)
Admission: RE | Admit: 2017-04-23 | Discharge: 2017-04-23 | Disposition: A | Discharge status: Home / Self Care | Payer: Medicare Other | Source: Ambulatory Visit | Attending: Cardiology | Admitting: Cardiology

## 2017-04-23 DIAGNOSIS — I6523 Occlusion and stenosis of bilateral carotid arteries: Secondary | ICD-10-CM | POA: Insufficient documentation

## 2017-04-23 DIAGNOSIS — I6529 Occlusion and stenosis of unspecified carotid artery: Secondary | ICD-10-CM | POA: Diagnosis present

## 2017-04-26 ENCOUNTER — Other Ambulatory Visit: Payer: Self-pay | Admitting: Cardiovascular Disease

## 2017-04-26 DIAGNOSIS — I779 Disorder of arteries and arterioles, unspecified: Secondary | ICD-10-CM

## 2017-04-26 DIAGNOSIS — I739 Peripheral vascular disease, unspecified: Principal | ICD-10-CM

## 2017-05-08 ENCOUNTER — Encounter: Payer: Self-pay | Admitting: Cardiovascular Disease

## 2017-05-08 ENCOUNTER — Ambulatory Visit (INDEPENDENT_AMBULATORY_CARE_PROVIDER_SITE_OTHER): Payer: Medicare Other | Admitting: Cardiovascular Disease

## 2017-05-08 VITALS — BP 138/60 | HR 55 | Ht 67.5 in | Wt 183.0 lb

## 2017-05-08 DIAGNOSIS — E785 Hyperlipidemia, unspecified: Secondary | ICD-10-CM

## 2017-05-08 DIAGNOSIS — I10 Essential (primary) hypertension: Secondary | ICD-10-CM | POA: Diagnosis not present

## 2017-05-08 DIAGNOSIS — I350 Nonrheumatic aortic (valve) stenosis: Secondary | ICD-10-CM

## 2017-05-08 DIAGNOSIS — I701 Atherosclerosis of renal artery: Secondary | ICD-10-CM

## 2017-05-08 DIAGNOSIS — I2510 Atherosclerotic heart disease of native coronary artery without angina pectoris: Secondary | ICD-10-CM

## 2017-05-08 DIAGNOSIS — I679 Cerebrovascular disease, unspecified: Secondary | ICD-10-CM

## 2017-05-08 NOTE — Assessment & Plan Note (Signed)
History of essential hypertension blood pressure measured 138/60. He is on losartan and metoprolol. Continue current meds at current dosing

## 2017-05-08 NOTE — Assessment & Plan Note (Signed)
History of bilateral renal artery stenosis by angiography 03/20/16 with 90% ostial left renal artery stenosis and 40-50% ostial right. He underwent PTA and stenting by myself using a 6 mm x 15 mm long balloon expandable stent with excellent result. His follow-up renal Doppler studies performed 10/16/16 revealed a widely patent renal artery stent. This will be repeated on an annual basis.

## 2017-05-08 NOTE — Assessment & Plan Note (Signed)
History of carotid artery disease with moderate right ICA stenosis which would follow on an annual basis by duplex ultrasound.

## 2017-05-08 NOTE — Assessment & Plan Note (Signed)
History of hyperlipidemia on statin therapy. We will recheck a lipid and liver profile 

## 2017-05-08 NOTE — Addendum Note (Signed)
Addended by: Zebedee Iba on: 05/08/2017 04:36 PM   Modules accepted: Orders

## 2017-05-08 NOTE — Assessment & Plan Note (Signed)
History of moderate aortic stenosis with 2-D echo performed 10/16/16 revealing normal LV function with an aortic valve area of 1 cm and a peak gradient of 45 mmHg. He is currently a symptomatically from this. We will repeat this on annual basis.

## 2017-05-08 NOTE — Patient Instructions (Addendum)
Medication Instructions: Your physician recommends that you continue on your current medications as directed. Please refer to the Current Medication list given to you today.  Labwork:  Your physician recommends that you return for a FASTING lipid profile and hepatic function panel.   Testing/Procedures: Your physician has requested that you have an echocardiogram in January. Echocardiography is a painless test that uses sound waves to create images of your heart. It provides your doctor with information about the size and shape of your heart and how well your heart's chambers and valves are working. This procedure takes approximately one hour. There are no restrictions for this procedure.  Your physician has requested that you have a carotid duplex in July. This test is an ultrasound of the carotid arteries in your neck. It looks at blood flow through these arteries that supply the brain with blood. Allow one hour for this exam. There are no restrictions or special instructions.  Your physician has requested that you have a renal artery duplex in July. During this test, an ultrasound is used to evaluate blood flow to the kidneys. Allow one hour for this exam. Do not eat after midnight the day before and avoid carbonated beverages. Take your medications as you usually do.  Follow-Up: Your physician wants you to follow-up in: 1 year with Dr. Gwenlyn Found. You will receive a reminder letter in the mail two months in advance. If you don't receive a letter, please call our office to schedule the follow-up appointment.  If you need a refill on your cardiac medications before your next appointment, please call your pharmacy.

## 2017-05-08 NOTE — Assessment & Plan Note (Signed)
History of CAD status post stenting of his LAD diagonal branch by myself 07/14/2003 status post stenting by Dr. Melvern Banker 07/31/2003. He denies chest pain or shortness of breath.

## 2017-05-08 NOTE — Progress Notes (Signed)
05/08/2017 DENO SIDA   28-Apr-1936  [id number]  Primary Physician Shon Baton, MD Primary Cardiologist: Lorretta Harp MD Lupe Carney, [place]  HPI:  Eric [name] is a 81 y.o. male  mildly to moderately overweight married Caucasian male father of 10, grandfather to 8 grandchildren who is accompanied by his wife today as usual. I last saw him in the office 04/26/16.Marland Kitchen He has a history of CAD and PVOD. I stented his LAD and diagonal branch back on July 14, 2003 and subsequent stenting by Dr. Melvern Banker July 31, 2003. He had mild bilateral renal artery stenosis at cath with blockages in the 40% range as well as moderate right internal carotid artery stenosis by duplex ultrasound. He was neurologic asymptomatic. His other problems include hypertension and hyperlipidemia. He has obstructive sleep apnea followed by Dr. Claiborne Billings. His most recent lab work revealed a total cholesterol of 125, LDL of 58 and HDL of 36. Carotid Dopplers performed in September 2016 revealed moderate right ICA stenosis, unchanged from a prior study and renal Dopplers performed 01/05/16 showed progression of bilateral renal artery stenosis. CT angiography performed 02/04/16 that showed high-grade ostial left renal artery stenosis with probable right renal artery stenosis as well. The patient underwent left renal artery PTA and stenting using a 6 mm x 15 mm long balloon expandable stent with excellent angiographic result. His follow-up Dopplers performed on 10/16/16 showed marked improvement in his velocities and renal aortic ratios. Since I saw me regarding his remaining currently stable. He does have moderate aortic stenosis by 2-D echo performed 10/16/16 the valve area 1 cm and a peak gradient of 45 mmHg.   Current Meds  Medication Sig  . aspirin 81 MG tablet Take 81 mg by mouth daily.    Marland Kitchen atorvastatin (LIPITOR) 80 MG tablet Take 1 tablet (80 mg total) by mouth daily at 6 PM.  . clopidogrel (PLAVIX) 75 MG tablet TAKE 1  TABLET EVERY DAY  . diazepam (VALIUM) 5 MG tablet TAKE 1 TABLET EVERY 8 HOURS AS NEEDED (Patient taking differently: Take 5 mg by mouth 2 (two) times daily. TAKE 1 TABLET EVERY 8 HOURS AS NEEDED)  . ezetimibe (ZETIA) 10 MG tablet Take 1 tablet (10 mg total) by mouth daily. PLEASE MAKE APPOINTMENT FOR FURTHER REFILLS  . fluticasone (FLONASE) 50 MCG/ACT nasal spray Place 2 sprays into both nostrils daily.  Marland Kitchen losartan (COZAAR) 100 MG tablet Take 100 mg by mouth daily.  . meclizine (ANTIVERT) 25 MG tablet TAKE ONE TABLET BY MOUTH EVERY 8 HOURS AS NEEDED FOR DIZZINESS  . metoprolol (LOPRESSOR) 50 MG tablet Take 1.5 tablets (75 mg total) by mouth 2 (two) times daily.  . nitroGLYCERIN (NITROSTAT) 0.4 MG SL tablet Place 1 tablet (0.4 mg total) under the tongue every 5 (five) minutes as needed for chest pain.  . pantoprazole (PROTONIX) 40 MG tablet TAKE 1 TABLET BY MOUTH EVERY DAY     Allergies  Allergen Reactions  . Niaspan [Niacin Er]   . Sertraline Hcl     REACTION: disorientation  . Tetanus Toxoids     Social History   Social History  . Marital status: Married    Spouse name: ruth  . Number of children: 4  . Years of education: N/A   Occupational History  . Retired    Social History Main Topics  . Smoking status: Former Smoker    Packs/day: 1.00    Years: 15.00    Types: Cigarettes  Quit date: 10/02/1964  . Smokeless tobacco: Never Used  . Alcohol use Yes     Comment: 03/20/2016 "haven't had a drink since the 1990s"  . Drug use: No  . Sexual activity: Not Currently   Other Topics Concern  . Not on file   Social History Narrative  . No narrative on file     Review of Systems: General: negative for chills, fever, night sweats or weight changes.  Cardiovascular: negative for chest pain, dyspnea on exertion, edema, orthopnea, palpitations, paroxysmal nocturnal dyspnea or shortness of breath Dermatological: negative for rash Respiratory: negative for cough or  wheezing Urologic: negative for hematuria Abdominal: negative for nausea, vomiting, diarrhea, bright red blood per rectum, melena, or hematemesis Neurologic: negative for visual changes, syncope, or dizziness All other systems reviewed and are otherwise negative except as noted above.    Blood pressure 138/60, pulse (!) 55, height 5' 7.5" (1.715 m), weight 183 lb (83 kg).  General appearance: alert and no distress Neck: no adenopathy, no JVD, supple, symmetrical, trachea midline, thyroid not enlarged, symmetric, no tenderness/mass/nodules and Soft left carotid bruit Lungs: clear to auscultation bilaterally Heart: 2/6 outflow tract murmur consistent with aortic stenosis Extremities: extremities normal, atraumatic, no cyanosis or edema  EKG sinus bradycardia 55 without ST or T-wave changes. I personally reviewed this EKG.  ASSESSMENT AND PLAN:   Hyperlipidemia History of hyperlipidemia on statin therapy. We will recheck a lipid and liver profile  Essential hypertension History of essential hypertension blood pressure measured 138/60. He is on losartan and metoprolol. Continue current meds at current dosing  Coronary atherosclerosis History of CAD status post stenting of his LAD diagonal branch by myself 07/14/2003 status post stenting by Dr. Melvern Banker 07/31/2003. He denies chest pain or shortness of breath.  CEREBROVASCULAR DISEASE History of carotid artery disease with moderate right ICA stenosis which would follow on an annual basis by duplex ultrasound.  Bilateral renal artery stenosis (HCC) History of bilateral renal artery stenosis by angiography 03/20/16 with 90% ostial left renal artery stenosis and 40-50% ostial right. He underwent PTA and stenting by myself using a 6 mm x 15 mm long balloon expandable stent with excellent result. His follow-up renal Doppler studies performed 10/16/16 revealed a widely patent renal artery stent. This will be repeated on an annual basis.  Aortic  stenosis, moderate History of moderate aortic stenosis with 2-D echo performed 10/16/16 revealing normal LV function with an aortic valve area of 1 cm and a peak gradient of 45 mmHg. He is currently a symptomatically from this. We will repeat this on annual basis.      Lorretta Harp MD FACP,FACC,FAHA, FSCAI 05/08/2017 3:00 PM

## 2017-05-09 DIAGNOSIS — E785 Hyperlipidemia, unspecified: Secondary | ICD-10-CM | POA: Diagnosis not present

## 2017-05-09 LAB — HEPATIC FUNCTION PANEL
ALBUMIN: 4.2 g/dL (ref 3.5–4.8)
ALT: 22 IU/L (ref 0–44)
AST: 17 IU/L (ref 0–40)
Alkaline Phosphatase: 65 IU/L (ref 39–117)
Bilirubin Total: 1.1 mg/dL (ref 0.0–1.2)
Bilirubin, Direct: 0.23 mg/dL (ref 0.00–0.40)
Total Protein: 6.8 g/dL (ref 6.0–8.5)

## 2017-05-09 LAB — LIPID PANEL
CHOL/HDL RATIO: 2.8 ratio (ref 0.0–5.0)
CHOLESTEROL TOTAL: 123 mg/dL (ref 100–199)
HDL: 44 mg/dL (ref >39)
LDL CALC: 59 mg/dL (ref 0–99)
Triglycerides: 98 mg/dL (ref 0–149)
VLDL CHOLESTEROL CAL: 20 mg/dL (ref 5–40)

## 2017-05-14 ENCOUNTER — Telehealth: Payer: Self-pay | Admitting: Cardiovascular Disease

## 2017-05-14 NOTE — Telephone Encounter (Signed)
New Message     Please call with lab results from last week

## 2017-05-14 NOTE — Telephone Encounter (Signed)
See result note-results have been given.

## 2017-06-11 ENCOUNTER — Other Ambulatory Visit: Payer: Self-pay | Admitting: Cardiovascular Disease

## 2017-06-11 MED ORDER — PANTOPRAZOLE SODIUM 40 MG PO TBEC: 40.0000 mg | DELAYED_RELEASE_TABLET | Freq: Every day | ORAL | 3 refills | Status: AC

## 2017-06-11 NOTE — Telephone Encounter (Signed)
Rx(s) sent to pharmacy electronically.  

## 2017-06-27 DIAGNOSIS — Z23 Encounter for immunization: Secondary | ICD-10-CM | POA: Diagnosis not present

## 2017-07-02 ENCOUNTER — Other Ambulatory Visit: Payer: Self-pay

## 2017-07-02 MED ORDER — ATORVASTATIN CALCIUM 80 MG PO TABS: 80.0000 mg | ORAL_TABLET | Freq: Every day | ORAL | 3 refills | Status: DC

## 2017-07-02 NOTE — Telephone Encounter (Signed)
Rx(s) sent to pharmacy electronically.  

## 2017-07-10 ENCOUNTER — Other Ambulatory Visit: Payer: Self-pay | Admitting: Cardiovascular Disease

## 2017-07-19 DIAGNOSIS — I10 Essential (primary) hypertension: Secondary | ICD-10-CM | POA: Diagnosis not present

## 2017-07-19 DIAGNOSIS — E7849 Other hyperlipidemia: Secondary | ICD-10-CM | POA: Diagnosis not present

## 2017-07-19 DIAGNOSIS — Z125 Encounter for screening for malignant neoplasm of prostate: Secondary | ICD-10-CM | POA: Diagnosis not present

## 2017-07-19 DIAGNOSIS — E1122 Type 2 diabetes mellitus with diabetic chronic kidney disease: Secondary | ICD-10-CM | POA: Diagnosis not present

## 2017-07-26 DIAGNOSIS — I2789 Other specified pulmonary heart diseases: Secondary | ICD-10-CM | POA: Diagnosis not present

## 2017-07-26 DIAGNOSIS — I6529 Occlusion and stenosis of unspecified carotid artery: Secondary | ICD-10-CM | POA: Diagnosis not present

## 2017-07-26 DIAGNOSIS — R82998 Other abnormal findings in urine: Secondary | ICD-10-CM | POA: Diagnosis not present

## 2017-07-26 DIAGNOSIS — I208 Other forms of angina pectoris: Secondary | ICD-10-CM | POA: Diagnosis not present

## 2017-07-26 DIAGNOSIS — Z1389 Encounter for screening for other disorder: Secondary | ICD-10-CM | POA: Diagnosis not present

## 2017-07-26 DIAGNOSIS — I350 Nonrheumatic aortic (valve) stenosis: Secondary | ICD-10-CM | POA: Diagnosis not present

## 2017-07-26 DIAGNOSIS — E1122 Type 2 diabetes mellitus with diabetic chronic kidney disease: Secondary | ICD-10-CM | POA: Diagnosis not present

## 2017-07-26 DIAGNOSIS — I701 Atherosclerosis of renal artery: Secondary | ICD-10-CM | POA: Diagnosis not present

## 2017-07-26 DIAGNOSIS — N183 Chronic kidney disease, stage 3 (moderate): Secondary | ICD-10-CM | POA: Diagnosis not present

## 2017-07-26 DIAGNOSIS — Z6829 Body mass index (BMI) 29.0-29.9, adult: Secondary | ICD-10-CM | POA: Diagnosis not present

## 2017-07-26 DIAGNOSIS — I7389 Other specified peripheral vascular diseases: Secondary | ICD-10-CM | POA: Diagnosis not present

## 2017-07-26 DIAGNOSIS — Z0000 Encounter for general adult medical examination without abnormal findings: Secondary | ICD-10-CM | POA: Diagnosis not present

## 2017-07-26 DIAGNOSIS — I129 Hypertensive chronic kidney disease with stage 1 through stage 4 chronic kidney disease, or unspecified chronic kidney disease: Secondary | ICD-10-CM | POA: Diagnosis not present

## 2017-08-06 DIAGNOSIS — Z1212 Encounter for screening for malignant neoplasm of rectum: Secondary | ICD-10-CM | POA: Diagnosis not present

## 2017-08-08 ENCOUNTER — Other Ambulatory Visit: Payer: Self-pay | Admitting: Cardiovascular Disease

## 2017-08-08 NOTE — Telephone Encounter (Signed)
Rx has been sent to the pharmacy electronically. ° °

## 2017-09-16 ENCOUNTER — Other Ambulatory Visit: Payer: Self-pay | Admitting: Cardiovascular Disease

## 2017-09-17 NOTE — Telephone Encounter (Signed)
REFILL 

## 2017-09-19 ENCOUNTER — Other Ambulatory Visit: Payer: Self-pay | Admitting: Cardiovascular Disease

## 2017-09-19 ENCOUNTER — Telehealth: Payer: Self-pay | Admitting: Cardiovascular Disease

## 2017-09-19 NOTE — Telephone Encounter (Signed)
Returned call to patient's wife.She was calling to confirm doppler appointments.Advised patient is scheduled to have renal and carotid dopplers 10/04/17 at 9:00 am,echo to be done at 11:30 am.

## 2017-09-19 NOTE — Telephone Encounter (Signed)
Please call,question about when he needs his next doppler.If not there,please call [phone].

## 2017-10-04 ENCOUNTER — Ambulatory Visit (HOSPITAL_COMMUNITY): Payer: Medicare Other

## 2017-10-04 ENCOUNTER — Other Ambulatory Visit (HOSPITAL_COMMUNITY): Payer: Medicare Other

## 2017-10-15 ENCOUNTER — Other Ambulatory Visit: Payer: Self-pay

## 2017-10-15 ENCOUNTER — Ambulatory Visit (HOSPITAL_COMMUNITY): Payer: Medicare Other | Attending: Cardiovascular Disease

## 2017-10-15 ENCOUNTER — Encounter (HOSPITAL_COMMUNITY): Payer: Medicare Other

## 2017-10-15 DIAGNOSIS — I420 Dilated cardiomyopathy: Secondary | ICD-10-CM | POA: Diagnosis not present

## 2017-10-15 DIAGNOSIS — I5030 Unspecified diastolic (congestive) heart failure: Secondary | ICD-10-CM | POA: Diagnosis not present

## 2017-10-15 DIAGNOSIS — I350 Nonrheumatic aortic (valve) stenosis: Secondary | ICD-10-CM | POA: Diagnosis not present

## 2017-10-15 DIAGNOSIS — I051 Rheumatic mitral insufficiency: Secondary | ICD-10-CM | POA: Insufficient documentation

## 2017-10-18 ENCOUNTER — Encounter (HOSPITAL_COMMUNITY): Payer: Medicare Other

## 2017-10-19 ENCOUNTER — Telehealth: Payer: Self-pay | Admitting: Cardiovascular Disease

## 2017-10-19 ENCOUNTER — Other Ambulatory Visit: Payer: Self-pay | Admitting: Cardiovascular Disease

## 2017-10-19 DIAGNOSIS — M79605 Pain in left leg: Secondary | ICD-10-CM

## 2017-10-19 DIAGNOSIS — I739 Peripheral vascular disease, unspecified: Secondary | ICD-10-CM

## 2017-10-19 DIAGNOSIS — M79604 Pain in right leg: Secondary | ICD-10-CM

## 2017-10-19 DIAGNOSIS — I350 Nonrheumatic aortic (valve) stenosis: Secondary | ICD-10-CM

## 2017-10-19 NOTE — Telephone Encounter (Signed)
Spoke with pt wife, aware of echo results. He has renals and carotids scheduled on the 30th and she reports he is having a lot of problems with his legs and they want a doppler for that as well. She reports the patients legs hurt at night and if he walks too far. Will forward to dr berry to review and advise.

## 2017-10-19 NOTE — Telephone Encounter (Signed)
Getting lower extremity Dopplers at the same time is fine with me

## 2017-10-19 NOTE — Telephone Encounter (Signed)
°  New Prob  Calling to follow up on most recent echo results. Please call.

## 2017-10-22 NOTE — Telephone Encounter (Signed)
Eric [name] is calling because she received a call stating that Mr. Ohanian is needing to have another Echo Cardiogram . Please call   Thanks

## 2017-10-22 NOTE — Telephone Encounter (Signed)
Returned call to patient's wife. She states they got a call last week to schedule an echo - explained that this will be for next year. Wife does not wish to schedule at this time. LEA ordered per Dr. Gwenlyn Found and patient request. Staff message send to F. Clark to schedule on 1/30, ideally for patient.

## 2017-10-31 ENCOUNTER — Other Ambulatory Visit: Payer: Self-pay | Admitting: Cardiovascular Disease

## 2017-10-31 ENCOUNTER — Ambulatory Visit (HOSPITAL_BASED_OUTPATIENT_CLINIC_OR_DEPARTMENT_OTHER)
Admission: RE | Admit: 2017-10-31 | Discharge: 2017-10-31 | Disposition: A | Discharge status: Home / Self Care | Payer: Medicare Other | Source: Ambulatory Visit | Attending: Cardiovascular Disease | Admitting: Cardiovascular Disease

## 2017-10-31 ENCOUNTER — Ambulatory Visit (HOSPITAL_COMMUNITY)
Admission: RE | Admit: 2017-10-31 | Discharge: 2017-10-31 | Disposition: A | Discharge status: Home / Self Care | Payer: Medicare Other | Source: Ambulatory Visit | Attending: Cardiovascular Disease | Admitting: Cardiovascular Disease

## 2017-10-31 DIAGNOSIS — M79605 Pain in left leg: Secondary | ICD-10-CM | POA: Insufficient documentation

## 2017-10-31 DIAGNOSIS — I739 Peripheral vascular disease, unspecified: Secondary | ICD-10-CM | POA: Insufficient documentation

## 2017-10-31 DIAGNOSIS — I6523 Occlusion and stenosis of bilateral carotid arteries: Secondary | ICD-10-CM | POA: Insufficient documentation

## 2017-10-31 DIAGNOSIS — M79604 Pain in right leg: Secondary | ICD-10-CM | POA: Insufficient documentation

## 2017-10-31 DIAGNOSIS — I779 Disorder of arteries and arterioles, unspecified: Secondary | ICD-10-CM | POA: Diagnosis not present

## 2017-10-31 DIAGNOSIS — I701 Atherosclerosis of renal artery: Secondary | ICD-10-CM | POA: Diagnosis not present

## 2017-11-05 ENCOUNTER — Telehealth: Payer: Self-pay | Admitting: *Deleted

## 2017-11-05 NOTE — Telephone Encounter (Signed)
Left message for patient to call and schedule f/u visit with Dr. Gwenlyn Found to discuss doppler results.

## 2017-11-20 ENCOUNTER — Ambulatory Visit (INDEPENDENT_AMBULATORY_CARE_PROVIDER_SITE_OTHER): Payer: Medicare Other | Admitting: Cardiovascular Disease

## 2017-11-20 ENCOUNTER — Encounter: Payer: Self-pay | Admitting: Cardiovascular Disease

## 2017-11-20 VITALS — BP 128/62 | HR 54 | Ht 68.0 in | Wt 188.6 lb

## 2017-11-20 DIAGNOSIS — I779 Disorder of arteries and arterioles, unspecified: Secondary | ICD-10-CM | POA: Diagnosis not present

## 2017-11-20 DIAGNOSIS — I679 Cerebrovascular disease, unspecified: Secondary | ICD-10-CM

## 2017-11-20 DIAGNOSIS — I6521 Occlusion and stenosis of right carotid artery: Secondary | ICD-10-CM

## 2017-11-20 DIAGNOSIS — I739 Peripheral vascular disease, unspecified: Secondary | ICD-10-CM | POA: Diagnosis not present

## 2017-11-20 DIAGNOSIS — I701 Atherosclerosis of renal artery: Secondary | ICD-10-CM | POA: Diagnosis not present

## 2017-11-20 DIAGNOSIS — I350 Nonrheumatic aortic (valve) stenosis: Secondary | ICD-10-CM

## 2017-11-20 NOTE — Assessment & Plan Note (Signed)
History of carotid artery stenosis with Dopplers performed 10/31/17 revealing moderate bilateral ICA stenosis which will be repeated on an annual basis.

## 2017-11-20 NOTE — Assessment & Plan Note (Signed)
History of peripheral arterial disease with recent Dopplers that show normal ABIs. The patient denies claudication.

## 2017-11-20 NOTE — Assessment & Plan Note (Signed)
History of recent 2-D echocardiogram revealing moderate aortic stenosis with a valve area 1.037 m and a peak gradient of 34 mmHg. This will be followed on an annual basis.

## 2017-11-20 NOTE — Assessment & Plan Note (Signed)
History of CAD status post LAD and diagonal branch stenting by myself 07/14/03 with subsequent stenting by Dr. Melvern Banker 07/31/03. He denies chest pain or shortness of breath.

## 2017-11-20 NOTE — Assessment & Plan Note (Signed)
History of hyperlipidemia on statin therapy with recent lipid profile performed 05/09/17 revealing an LDL cholesterol 59 and an HDL cholesterol 44.

## 2017-11-20 NOTE — Assessment & Plan Note (Signed)
History of essential hypertension blood pressure measures 128/62. He is on losartan and metoprolol. Continue current meds at current dosing.

## 2017-11-20 NOTE — Progress Notes (Signed)
11/20/2017 Eric [name]   08-Feb-1936  [id number]  Primary Physician Eric Baton, MD Primary Cardiologist: Eric Harp MD Eric [name], [place]  HPI:  Eric [name] is a 82 y.o.  mildly to moderately overweight married Caucasian male father of 59, grandfather to 8 grandchildren who is accompanied by his wife today as usual. I last saw him in the office 05/08/17.Marland Kitchen He has a history of CAD and PVOD. I stented his LAD and diagonal branch back on July 14, 2003 and subsequent stenting by Dr. Melvern [name] July 31, 2003. He had mild bilateral renal artery stenosis at cath with blockages in the 40% range as well as moderate right internal carotid artery stenosis by duplex ultrasound. He was neurologic asymptomatic. His other problems include hypertension and hyperlipidemia. He has obstructive sleep apnea followed by Dr. Claiborne [name]. His most recent lab work revealed a total cholesterol of 125, LDL of 58 and HDL of 36. Carotid Dopplers performed in September 2016 revealed moderate right ICA stenosis, unchanged from a prior study and renal Dopplers performed 01/05/16 showed progression of bilateral renal artery stenosis. CT angiography performed 02/04/16 that showed high-grade ostial left renal artery stenosis with probable right renal artery stenosis as well. The patient underwent left renal artery PTA and stenting using a 6 mm x 15 mm long balloon expandable stent with excellent angiographic result. His follow-up Dopplers performed on 10/16/16 showed marked improvement in his velocities and renal aortic ratios. Since I saw me regarding his remaining currently stable. He does have moderate aortic stenosis by 2-D echo performed 10/16/16 the valve area 1 cm and a peak gradient of 45 mmHg. Recent noninvasive tests reveal stable moderate aortic stenosis, patent left renal stent with moderate right renal artery stenosis, stable mild to moderate bilateral ICA stenosis and normal ABIs. The patient denies chest pain,  shortness of breath or claudication.     Current Meds  Medication Sig  . aspirin 81 MG tablet Take 81 mg by mouth daily.    Marland Kitchen atorvastatin (LIPITOR) 80 MG tablet Take 1 tablet (80 mg total) by mouth daily at 6 PM.  . clopidogrel (PLAVIX) 75 MG tablet TAKE 1 TABLET BY MOUTH EVERY DAY  . diazepam (VALIUM) 5 MG tablet TAKE 1 TABLET EVERY 8 HOURS AS NEEDED (Patient taking differently: Take 5 mg by mouth 2 (two) times daily. TAKE 1 TABLET EVERY 8 HOURS AS NEEDED)  . ezetimibe (ZETIA) 10 MG tablet TAKE 1 TABLET BY MOUTH DAILY  . fluticasone (FLONASE) 50 MCG/ACT nasal spray Place 2 sprays into both nostrils daily.  . furosemide (LASIX) 20 MG tablet Take 20 mg by mouth as needed (AS NEEDED FOR SWELLING).  Marland Kitchen losartan (COZAAR) 100 MG tablet Take 100 mg by mouth daily.  . meclizine (ANTIVERT) 25 MG tablet TAKE ONE TABLET BY MOUTH EVERY 8 HOURS AS NEEDED FOR DIZZINESS  . metoprolol tartrate (LOPRESSOR) 50 MG tablet TAKE 1.5 TABLETS BY MOUTH TWICE DAILY  . nitroGLYCERIN (NITROSTAT) 0.4 MG SL tablet Place 1 tablet (0.4 mg total) under the tongue every 5 (five) minutes as needed for chest pain.  . pantoprazole (PROTONIX) 40 MG tablet Take 1 tablet (40 mg total) by mouth daily.     Allergies  Allergen Reactions  . Niaspan [Niacin Er]   . Sertraline Hcl     REACTION: disorientation  . Tetanus Toxoids     Social History   Socioeconomic History  . Marital status: Married    Spouse name: Eric  .  Number of children: 4  . Years of education: Not on file  . Highest education level: Not on file  Social Needs  . Financial resource strain: Not on file  . Food insecurity - worry: Not on file  . Food insecurity - inability: Not on file  . Transportation needs - medical: Not on file  . Transportation needs - non-medical: Not on file  Occupational History  . Occupation: Retired  Tobacco Use  . Smoking status: Former Smoker    Packs/day: 1.00    Years: 15.00    Pack years: 15.00    Types:  Cigarettes    Last attempt to quit: 10/02/1964    Years since quitting: 53.1  . Smokeless tobacco: Never Used  Substance and Sexual Activity  . Alcohol use: Yes    Comment: 03/20/2016 "haven't had a drink since the 1990s"  . Drug use: No  . Sexual activity: Not Currently  Other Topics Concern  . Not on file  Social History Narrative  . Not on file     Review of Systems: General: negative for chills, fever, night sweats or weight changes.  Cardiovascular: negative for chest pain, dyspnea on exertion, edema, orthopnea, palpitations, paroxysmal nocturnal dyspnea or shortness of breath Dermatological: negative for rash Respiratory: negative for cough or wheezing Urologic: negative for hematuria Abdominal: negative for nausea, vomiting, diarrhea, bright red blood per rectum, melena, or hematemesis Neurologic: negative for visual changes, syncope, or dizziness All other systems reviewed and are otherwise negative except as noted above.    Blood pressure 128/62, pulse (!) 54, height 5\' 8"  (1.727 m), weight 188 lb 9.6 oz (85.5 kg).  General appearance: alert and no distress Neck: no adenopathy, no JVD, supple, symmetrical, trachea midline, thyroid not enlarged, symmetric, no tenderness/mass/nodules and Soft bilateral bruits Lungs: clear to auscultation bilaterally Heart: Soft outflow tract murmur consistent with aortic stenosis Extremities: extremities normal, atraumatic, no cyanosis or edema Pulses: 2+ and symmetric Skin: Skin color, texture, turgor normal. No rashes or lesions Neurologic: Alert and oriented X 3, normal strength and tone. Normal symmetric reflexes. Normal coordination and gait  EKG sinus bradycardia 54 without ST or T-wave changes. I personally reviewed this EKG  ASSESSMENT AND PLAN:   Hyperlipidemia History of hyperlipidemia on statin therapy with recent lipid profile performed 05/09/17 revealing an LDL cholesterol 59 and an HDL cholesterol 44.  Essential  hypertension History of essential hypertension blood pressure measures 128/62. He is on losartan and metoprolol. Continue current meds at current dosing.  Coronary atherosclerosis History of CAD status post LAD and diagonal branch stenting by myself 07/14/03 with subsequent stenting by Dr. Melvern [name] 07/31/03. He denies chest pain or shortness of breath.  CEREBROVASCULAR DISEASE History of carotid artery stenosis with Dopplers performed 10/31/17 revealing moderate bilateral ICA stenosis which will be repeated on an annual basis.  Bilateral renal artery stenosis (HCC) History of bilateral renal artery stenosis with history of left renal artery stenting 01/2016 recent renal Doppler studies performed 10/31/17 revealed a patent left renal artery stent with moderate right renal artery stenosis. This will be followed noninvasively on annual basis.  Aortic stenosis, moderate History of recent 2-D echocardiogram revealing moderate aortic stenosis with a valve area 1.037 m and a peak gradient of 34 mmHg. This will be followed on an annual basis.  Peripheral vascular disease ([place]) History of peripheral arterial disease with recent Dopplers that show normal ABIs. The patient denies claudication.      Eric Harp MD [place], [facility] 11/20/2017 11:34  AM

## 2017-11-20 NOTE — Assessment & Plan Note (Signed)
History of bilateral renal artery stenosis with history of left renal artery stenting 01/2016 recent renal Doppler studies performed 10/31/17 revealed a patent left renal artery stent with moderate right renal artery stenosis. This will be followed noninvasively on annual basis.

## 2017-11-20 NOTE — Patient Instructions (Signed)
Medication Instructions: Your physician recommends that you continue on your current medications as directed. Please refer to the Current Medication list given to you today.   Testing/Procedures:  In 1 year: Your physician has requested that you have an echocardiogram. Echocardiography is a painless test that uses sound waves to create images of your heart. It provides your doctor with information about the size and shape of your heart and how well your heart's chambers and valves are working. This procedure takes approximately one hour. There are no restrictions for this procedure.  Your physician has requested that you have a carotid duplex. This test is an ultrasound of the carotid arteries in your neck. It looks at blood flow through these arteries that supply the brain with blood. Allow one hour for this exam. There are no restrictions or special instructions.  Your physician has requested that you have a renal artery duplex. During this test, an ultrasound is used to evaluate blood flow to the kidneys. Allow one hour for this exam. Do not eat after midnight the day before and avoid carbonated beverages. Take your medications as you usually do.  Your physician has requested that you have a lower extremity arterial duplex. During this test, ultrasound is used to evaluate arterial blood flow in the legs. Allow one hour for this exam. There are no restrictions or special instructions.  Your physician has requested that you have an ankle brachial index (ABI). During this test an ultrasound and blood pressure cuff are used to evaluate the arteries that supply the arms and legs with blood. Allow thirty minutes for this exam. There are no restrictions or special instructions.  Follow-Up: Your physician wants you to follow-up in: 1 year with Dr. Veleta Miners all testing completed. You will receive a reminder letter in the mail two months in advance. If you don't receive a letter, please call our  office to schedule the follow-up appointment.  If you need a refill on your cardiac medications before your next appointment, please call your pharmacy.

## 2018-02-11 DIAGNOSIS — H25013 Cortical age-related cataract, bilateral: Secondary | ICD-10-CM | POA: Diagnosis not present

## 2018-02-11 DIAGNOSIS — H2513 Age-related nuclear cataract, bilateral: Secondary | ICD-10-CM | POA: Diagnosis not present

## 2018-03-06 DIAGNOSIS — H2513 Age-related nuclear cataract, bilateral: Secondary | ICD-10-CM | POA: Diagnosis not present

## 2018-03-06 DIAGNOSIS — H25013 Cortical age-related cataract, bilateral: Secondary | ICD-10-CM | POA: Diagnosis not present

## 2018-03-18 DIAGNOSIS — Z6830 Body mass index (BMI) 30.0-30.9, adult: Secondary | ICD-10-CM | POA: Diagnosis not present

## 2018-03-18 DIAGNOSIS — E1122 Type 2 diabetes mellitus with diabetic chronic kidney disease: Secondary | ICD-10-CM | POA: Diagnosis not present

## 2018-03-18 DIAGNOSIS — Z125 Encounter for screening for malignant neoplasm of prostate: Secondary | ICD-10-CM | POA: Diagnosis not present

## 2018-03-18 DIAGNOSIS — I129 Hypertensive chronic kidney disease with stage 1 through stage 4 chronic kidney disease, or unspecified chronic kidney disease: Secondary | ICD-10-CM | POA: Diagnosis not present

## 2018-03-18 DIAGNOSIS — E7849 Other hyperlipidemia: Secondary | ICD-10-CM | POA: Diagnosis not present

## 2018-03-18 DIAGNOSIS — N183 Chronic kidney disease, stage 3 (moderate): Secondary | ICD-10-CM | POA: Diagnosis not present

## 2018-03-21 DIAGNOSIS — Z1212 Encounter for screening for malignant neoplasm of rectum: Secondary | ICD-10-CM | POA: Diagnosis not present

## 2018-03-27 DIAGNOSIS — H2511 Age-related nuclear cataract, right eye: Secondary | ICD-10-CM | POA: Diagnosis not present

## 2018-03-27 DIAGNOSIS — H25011 Cortical age-related cataract, right eye: Secondary | ICD-10-CM | POA: Diagnosis not present

## 2018-03-27 DIAGNOSIS — H25012 Cortical age-related cataract, left eye: Secondary | ICD-10-CM | POA: Diagnosis not present

## 2018-03-27 DIAGNOSIS — H2512 Age-related nuclear cataract, left eye: Secondary | ICD-10-CM | POA: Diagnosis not present

## 2018-04-10 DIAGNOSIS — H25012 Cortical age-related cataract, left eye: Secondary | ICD-10-CM | POA: Diagnosis not present

## 2018-04-10 DIAGNOSIS — H2512 Age-related nuclear cataract, left eye: Secondary | ICD-10-CM | POA: Diagnosis not present

## 2018-06-02 ENCOUNTER — Other Ambulatory Visit: Payer: Self-pay | Admitting: Cardiovascular Disease

## 2018-06-04 NOTE — Telephone Encounter (Signed)
Rx sent to pharmacy   

## 2018-06-27 DIAGNOSIS — Z23 Encounter for immunization: Secondary | ICD-10-CM | POA: Diagnosis not present

## 2018-08-13 DIAGNOSIS — Z961 Presence of intraocular lens: Secondary | ICD-10-CM | POA: Diagnosis not present

## 2018-08-20 DIAGNOSIS — R82998 Other abnormal findings in urine: Secondary | ICD-10-CM | POA: Diagnosis not present

## 2018-08-20 DIAGNOSIS — E1122 Type 2 diabetes mellitus with diabetic chronic kidney disease: Secondary | ICD-10-CM | POA: Diagnosis not present

## 2018-08-20 DIAGNOSIS — I10 Essential (primary) hypertension: Secondary | ICD-10-CM | POA: Diagnosis not present

## 2018-08-20 DIAGNOSIS — Z125 Encounter for screening for malignant neoplasm of prostate: Secondary | ICD-10-CM | POA: Diagnosis not present

## 2018-08-20 DIAGNOSIS — E7849 Other hyperlipidemia: Secondary | ICD-10-CM | POA: Diagnosis not present

## 2018-11-15 ENCOUNTER — Ambulatory Visit (HOSPITAL_COMMUNITY)
Admission: RE | Admit: 2018-11-15 | Discharge: 2018-11-15 | Disposition: A | Discharge status: Home / Self Care | Payer: Medicare Other | Source: Ambulatory Visit | Attending: Cardiology | Admitting: Cardiology

## 2018-11-15 ENCOUNTER — Ambulatory Visit (HOSPITAL_BASED_OUTPATIENT_CLINIC_OR_DEPARTMENT_OTHER)
Admission: RE | Admit: 2018-11-15 | Discharge: 2018-11-15 | Disposition: A | Discharge status: Home / Self Care | Payer: Medicare Other | Source: Ambulatory Visit | Attending: Cardiovascular Disease | Admitting: Cardiovascular Disease

## 2018-11-15 DIAGNOSIS — I6521 Occlusion and stenosis of right carotid artery: Secondary | ICD-10-CM

## 2018-11-15 DIAGNOSIS — I739 Peripheral vascular disease, unspecified: Secondary | ICD-10-CM | POA: Insufficient documentation

## 2018-11-15 DIAGNOSIS — I701 Atherosclerosis of renal artery: Secondary | ICD-10-CM | POA: Insufficient documentation

## 2018-11-19 ENCOUNTER — Other Ambulatory Visit: Payer: Self-pay | Admitting: *Deleted

## 2018-11-19 DIAGNOSIS — I6521 Occlusion and stenosis of right carotid artery: Secondary | ICD-10-CM

## 2018-11-19 DIAGNOSIS — I701 Atherosclerosis of renal artery: Secondary | ICD-10-CM

## 2018-11-21 DIAGNOSIS — N183 Chronic kidney disease, stage 3 (moderate): Secondary | ICD-10-CM | POA: Diagnosis not present

## 2018-11-21 DIAGNOSIS — Z1339 Encounter for screening examination for other mental health and behavioral disorders: Secondary | ICD-10-CM | POA: Diagnosis not present

## 2018-11-21 DIAGNOSIS — I701 Atherosclerosis of renal artery: Secondary | ICD-10-CM | POA: Diagnosis not present

## 2018-11-21 DIAGNOSIS — I129 Hypertensive chronic kidney disease with stage 1 through stage 4 chronic kidney disease, or unspecified chronic kidney disease: Secondary | ICD-10-CM | POA: Diagnosis not present

## 2018-11-21 DIAGNOSIS — I272 Pulmonary hypertension, unspecified: Secondary | ICD-10-CM | POA: Diagnosis not present

## 2018-11-21 DIAGNOSIS — Z0000 Encounter for general adult medical examination without abnormal findings: Secondary | ICD-10-CM | POA: Diagnosis not present

## 2018-11-21 DIAGNOSIS — I209 Angina pectoris, unspecified: Secondary | ICD-10-CM | POA: Diagnosis not present

## 2018-11-21 DIAGNOSIS — Z6829 Body mass index (BMI) 29.0-29.9, adult: Secondary | ICD-10-CM | POA: Diagnosis not present

## 2018-11-21 DIAGNOSIS — E1122 Type 2 diabetes mellitus with diabetic chronic kidney disease: Secondary | ICD-10-CM | POA: Diagnosis not present

## 2018-11-21 DIAGNOSIS — I350 Nonrheumatic aortic (valve) stenosis: Secondary | ICD-10-CM | POA: Diagnosis not present

## 2018-11-21 DIAGNOSIS — E668 Other obesity: Secondary | ICD-10-CM | POA: Diagnosis not present

## 2018-11-21 DIAGNOSIS — I739 Peripheral vascular disease, unspecified: Secondary | ICD-10-CM | POA: Diagnosis not present

## 2018-11-21 DIAGNOSIS — Z1331 Encounter for screening for depression: Secondary | ICD-10-CM | POA: Diagnosis not present

## 2018-11-21 DIAGNOSIS — R600 Localized edema: Secondary | ICD-10-CM | POA: Diagnosis not present

## 2018-11-30 ENCOUNTER — Other Ambulatory Visit: Payer: Self-pay | Admitting: Cardiovascular Disease

## 2018-12-06 ENCOUNTER — Encounter: Payer: Self-pay | Admitting: Cardiovascular Disease

## 2018-12-06 ENCOUNTER — Ambulatory Visit (INDEPENDENT_AMBULATORY_CARE_PROVIDER_SITE_OTHER): Payer: Medicare Other | Admitting: Cardiovascular Disease

## 2018-12-06 VITALS — BP 150/66 | HR 51 | Ht 68.0 in | Wt 190.0 lb

## 2018-12-06 DIAGNOSIS — E782 Mixed hyperlipidemia: Secondary | ICD-10-CM

## 2018-12-06 DIAGNOSIS — I10 Essential (primary) hypertension: Secondary | ICD-10-CM | POA: Diagnosis not present

## 2018-12-06 DIAGNOSIS — I739 Peripheral vascular disease, unspecified: Secondary | ICD-10-CM | POA: Diagnosis not present

## 2018-12-06 DIAGNOSIS — I701 Atherosclerosis of renal artery: Secondary | ICD-10-CM

## 2018-12-06 DIAGNOSIS — I350 Nonrheumatic aortic (valve) stenosis: Secondary | ICD-10-CM | POA: Diagnosis not present

## 2018-12-06 DIAGNOSIS — I679 Cerebrovascular disease, unspecified: Secondary | ICD-10-CM | POA: Diagnosis not present

## 2018-12-06 DIAGNOSIS — I2510 Atherosclerotic heart disease of native coronary artery without angina pectoris: Secondary | ICD-10-CM

## 2018-12-06 DIAGNOSIS — I779 Disorder of arteries and arterioles, unspecified: Secondary | ICD-10-CM

## 2018-12-06 NOTE — Assessment & Plan Note (Signed)
History of CAD status post cardiac catheterization January 2010 revealing patent stents in the RCA, LAD and diagonal branch system.  He had stenting back July 14, 2003 and subsequently July 31, 2003 by Dr. Melvern Banker.  He denies chest pain or shortness of breath.

## 2018-12-06 NOTE — Assessment & Plan Note (Signed)
History of carotid artery disease by duplex ultrasound last checked 11/15/2018 with moderate right and mild left ICA stenosis.  This will be rechecked on annual basis.

## 2018-12-06 NOTE — Progress Notes (Signed)
12/06/2018 Eric [name]   1936/01/30  [id number]  Primary Physician Eric Baton, MD Primary Cardiologist: Eric Harp MD Eric [name], [place]  HPI:  Eric [name] is a 83 y.o.  mildly to moderately overweight married Caucasian male father of 40, grandfather to 8 grandchildren who is accompanied by his wife today as usual. I last saw him in the office  11/20/2017.Marland Kitchen He has a history of CAD and PVOD. I stented his LAD and diagonal branch back on July 14, 2003 and subsequent stenting by Dr. Melvern [name] July 31, 2003. He had mild bilateral renal artery stenosis at cath with blockages in the 40% range as well as moderate right internal carotid artery stenosis by duplex ultrasound. He was neurologic asymptomatic. His other problems include hypertension and hyperlipidemia. He has obstructive sleep apnea followed by Dr. Claiborne [name]. His most recent lab work revealed a total cholesterol of 125, LDL of 58 and HDL of 36. Carotid Dopplers performed in September 2016 revealed moderate right ICA stenosis, unchanged from a prior study and renal Dopplers performed 01/05/16 showed progression of bilateral renal artery stenosis. CT angiography performed 02/04/16 that showed high-grade ostial left renal artery stenosis with probable right renal artery stenosis as well. The patient underwent left renal artery PTA and stenting using a 6 mm x 15 mm long balloon expandable stent with excellent angiographic result. His follow-up Dopplers performed on1/15/18showed marked improvement in his velocities and renal aortic ratios.Since I saw me regarding his remaining currently stable. He does have moderate aortic stenosis by 2-D echo performed 10/15/2017 the valve area 1 cm and a peak gradient of 34 mmHg.   Recent noninvasive tests show stable right ICA stenosis, patent left renal artery stent with moderate right renal artery stenosis and non-compressible vessels not allowing the determination of ABIs.  He does complain of pain  in his hips and legs which does not sound like claudication.  It may be statin related and we talked about starting a statin holiday for 3 months.  Current Meds  Medication Sig  . aspirin 81 MG tablet Take 81 mg by mouth daily.    Marland Kitchen atorvastatin (LIPITOR) 80 MG tablet TAKE 1 TABLET BY MOUTH EVERY DAY AT 6 PM  . clopidogrel (PLAVIX) 75 MG tablet TAKE 1 TABLET BY MOUTH EVERY DAY  . diazepam (VALIUM) 5 MG tablet TAKE 1 TABLET EVERY 8 HOURS AS NEEDED (Patient taking differently: Take 5 mg by mouth 2 (two) times daily. TAKE 1 TABLET EVERY 8 HOURS AS NEEDED)  . ezetimibe (ZETIA) 10 MG tablet TAKE 1 TABLET BY MOUTH DAILY  . fluticasone (FLONASE) 50 MCG/ACT nasal spray Place 2 sprays into both nostrils daily.  . furosemide (LASIX) 20 MG tablet Take 20 mg by mouth as needed (AS NEEDED FOR SWELLING).  Marland Kitchen losartan (COZAAR) 100 MG tablet Take 100 mg by mouth daily.  . meclizine (ANTIVERT) 25 MG tablet TAKE ONE TABLET BY MOUTH EVERY 8 HOURS AS NEEDED FOR DIZZINESS  . metoprolol tartrate (LOPRESSOR) 50 MG tablet TAKE 1.5 TABLETS BY MOUTH TWICE DAILY  . nitroGLYCERIN (NITROSTAT) 0.4 MG SL tablet Place 1 tablet (0.4 mg total) under the tongue every 5 (five) minutes as needed for chest pain.  . pantoprazole (PROTONIX) 40 MG tablet Take 1 tablet (40 mg total) by mouth daily.     Allergies  Allergen Reactions  . Niaspan [Niacin Er]   . Sertraline Hcl     REACTION: disorientation  . Tetanus Toxoids  Social History   Socioeconomic History  . Marital status: Married    Spouse name: Eric [name]  . Number of children: 4  . Years of education: Not on file  . Highest education level: Not on file  Occupational History  . Occupation: Retired  Scientific laboratory technician  . Financial resource strain: Not on file  . Food insecurity:    Worry: Not on file    Inability: Not on file  . Transportation needs:    Medical: Not on file    Non-medical: Not on file  Tobacco Use  . Smoking status: Former Smoker    Packs/day: 1.00      Years: 15.00    Pack years: 15.00    Types: Cigarettes    Last attempt to quit: 10/02/1964    Years since quitting: 54.2  . Smokeless tobacco: Never Used  Substance and Sexual Activity  . Alcohol use: Yes    Comment: 03/20/2016 "haven't had a drink since the 1990s"  . Drug use: No  . Sexual activity: Not Currently  Lifestyle  . Physical activity:    Days per week: Not on file    Minutes per session: Not on file  . Stress: Not on file  Relationships  . Social connections:    Talks on phone: Not on file    Gets together: Not on file    Attends religious service: Not on file    Active member of club or organization: Not on file    Attends meetings of clubs or organizations: Not on file    Relationship status: Not on file  . Intimate partner violence:    Fear of current or ex partner: Not on file    Emotionally abused: Not on file    Physically abused: Not on file    Forced sexual activity: Not on file  Other Topics Concern  . Not on file  Social History Narrative  . Not on file     Review of Systems: General: negative for chills, fever, night sweats or weight changes.  Cardiovascular: negative for chest pain, dyspnea on exertion, edema, orthopnea, palpitations, paroxysmal nocturnal dyspnea or shortness of breath Dermatological: negative for rash Respiratory: negative for cough or wheezing Urologic: negative for hematuria Abdominal: negative for nausea, vomiting, diarrhea, bright red blood per rectum, melena, or hematemesis Neurologic: negative for visual changes, syncope, or dizziness All other systems reviewed and are otherwise negative except as noted above.    Blood pressure (!) 150/66, pulse (!) 51, height 5\' 8"  (1.727 m), weight 190 lb (86.2 kg).  General appearance: alert and no distress Neck: no adenopathy, no JVD, supple, symmetrical, trachea midline, thyroid not enlarged, symmetric, no tenderness/mass/nodules and Soft right carotid bruit Lungs: clear to  auscultation bilaterally Heart: Soft outflow tract murmur consistent with aortic stenosis Extremities: extremities normal, atraumatic, no cyanosis or edema Pulses: 2+ and symmetric Skin: Skin color, texture, turgor normal. No rashes or lesions Neurologic: Alert and oriented X 3, normal strength and tone. Normal symmetric reflexes. Normal coordination and gait  EKG sinus bradycardia 51 without ST or T wave changes.  I personally reviewed this EKG.  ASSESSMENT AND PLAN:   Hyperlipidemia History of hyperlipidemia on high-dose statin therapy with lipid profile performed 08/20/2018 revealing total cholesterol of 130, LDL of 59 and HDL 40.  Essential hypertension History of essential hypertension blood pressure measured today 150/66.  He is on losartan and metoprolol.  Coronary atherosclerosis History of CAD status post cardiac catheterization January 2010 revealing patent stents in  the RCA, LAD and diagonal branch system.  He had stenting back July 14, 2003 and subsequently July 31, 2003 by Dr. Melvern [name].  He denies chest pain or shortness of breath.  CEREBROVASCULAR DISEASE History of carotid artery disease by duplex ultrasound last checked 11/15/2018 with moderate right and mild left ICA stenosis.  This will be rechecked on annual basis.  Bilateral renal artery stenosis (HCC) History of renal artery stenosis status post left renal artery PTA and stenting using a 6 mm x 15 mm long balloon expandable stent back in April 2017.  His most recent renal Dopplers revealed this to be widely patent on 11/15/2018 with moderate right renal artery stenosis and preserved renal dimensions.  This will be rechecked on annual basis.  Aortic stenosis, moderate Moderate aortic stenosis by 2D echo 10/15/2017 with a valve area of 1.08 cm and the peak gradient of 34 mmHg.  We will recheck a 2D echocardiogram.  He does have a soft outflow tract murmur but denies chest pain or shortness of  breath.      Eric Harp MD FACP,FACC,FAHA, [facility] 12/06/2018 11:15 AM

## 2018-12-06 NOTE — Assessment & Plan Note (Signed)
History of renal artery stenosis status post left renal artery PTA and stenting using a 6 mm x 15 mm long balloon expandable stent back in April 2017.  His most recent renal Dopplers revealed this to be widely patent on 11/15/2018 with moderate right renal artery stenosis and preserved renal dimensions.  This will be rechecked on annual basis.

## 2018-12-06 NOTE — Assessment & Plan Note (Signed)
Moderate aortic stenosis by 2D echo 10/15/2017 with a valve area of 1.08 cm and the peak gradient of 34 mmHg.  We will recheck a 2D echocardiogram.  He does have a soft outflow tract murmur but denies chest pain or shortness of breath.

## 2018-12-06 NOTE — Assessment & Plan Note (Signed)
History of essential hypertension blood pressure measured today 150/66.  He is on losartan and metoprolol.

## 2018-12-06 NOTE — Assessment & Plan Note (Signed)
History of hyperlipidemia on high-dose statin therapy with lipid profile performed 08/20/2018 revealing total cholesterol of 130, LDL of 59 and HDL 40.

## 2018-12-06 NOTE — Patient Instructions (Addendum)
Medication Instructions:  Your physician has recommended you make the following change in your medication:   HOLD YOUR ATORVASTATIN (LIPITOR) FOR 3 MONTHS. THIS IS REFERRED TO AS A "STATIN HOLIDAY". IF YOU CONTINUE TO HAVE DISCOMFORT/PAIN/SORENESS TO YOUR LEGS, PLEASE RESUME TAKING YOUR LIPITOR.   IF YOUR LEGS FEEL BETTER AFTER HOLDING THE MEDICATION FOR 3 MONTHS, YOU WILL HAVE A CONSULT WITH ONE OF OUR CLINICAL PHARMACISTS FOR A MEDICATION CALLED REPATHA. PLEASE CALL OUR OFFICE 3 MONTHS AFTER HOLDING YOUR LIPITOR TO UPDATE [place] ON THE STATUS OF YOUR SYMPTOMS.  If you need a refill on your cardiac medications before your next appointment, please call your pharmacy.   Lab work: NONE If you have labs (blood work) drawn today and your tests are completely normal, you will receive your results only by: Marland Kitchen MyChart Message (if you have MyChart) OR . A paper copy in the mail If you have any lab test that is abnormal or we need to change your treatment, we will call you to review the results.  Testing/Procedures: Your physician has requested that you have an echocardiogram. Echocardiography is a painless test that uses sound waves to create images of your heart. It provides your doctor with information about the size and shape of your heart and how well your heart's chambers and valves are working. This procedure takes approximately one hour. There are no restrictions for this procedure. SCHEDULE NOW  Your physician has requested that you have an aorta/iliac duplex. During this test, an ultrasound is used to evaluate blood flow to the aorta and iliac arteries. Allow one hour for this exam. Do not eat after midnight the day before and avoid carbonated beverages. SCHEDULE NOW  Your physician has requested that you have an ankle brachial index (ABI). During this test an ultrasound and blood pressure cuff are used to evaluate the arteries that supply the arms and legs with blood. Allow thirty minutes for  this exam. There are no restrictions or special instructions. SCHEDULE NOW  Your physician has requested that you have a carotid duplex. This test is an ultrasound of the carotid arteries in your neck. It looks at blood flow through these arteries that supply the brain with blood. Allow one hour for this exam. There are no restrictions or special instructions. SCHEDULE IN 1 YEAR  Your physician has requested that you have a renal artery duplex. During this test, an ultrasound is used to evaluate blood flow to the kidneys. Allow one hour for this exam. Do not eat after midnight the day before and avoid carbonated beverages. Take your medications as you usually do. SCHEDULE IN 1 YEAR   Follow-Up: At [facility], you and your health needs are our priority.  As part of our continuing mission to provide you with exceptional heart care, we have created designated Provider Care Teams.  These Care Teams include your primary Cardiologist (physician) and Advanced Practice Providers (APPs -  Physician Assistants and Nurse Practitioners) who all work together to provide you with the care you need, when you need it. . You will need a follow up appointment in 12 months.  Please call our office 2 months in advance to schedule this appointment.  You may see Dr. Gwenlyn Found or one of the following Advanced Practice Providers on your designated Care Team:   . Kerin Ransom, [place] . Almyra Deforest, PA-C . Fabian Sharp, PA-C . Jory Sims, DNP . Rosaria Ferries, PA-C . Roby Lofts, PA-C . Sande Rives, PA-C

## 2019-01-03 ENCOUNTER — Other Ambulatory Visit (HOSPITAL_COMMUNITY): Payer: Medicare Other

## 2019-01-03 ENCOUNTER — Inpatient Hospital Stay (HOSPITAL_COMMUNITY): Admission: RE | Admit: 2019-01-03 | Payer: Medicare Other | Source: Ambulatory Visit

## 2019-02-06 ENCOUNTER — Encounter (HOSPITAL_COMMUNITY): Payer: Medicare Other

## 2019-02-06 ENCOUNTER — Other Ambulatory Visit (HOSPITAL_COMMUNITY): Payer: Medicare Other

## 2019-03-17 ENCOUNTER — Telehealth: Payer: Self-pay | Admitting: Cardiovascular Disease

## 2019-03-17 NOTE — Telephone Encounter (Signed)
New message:    Patient calling concering his up coming appt's. Patient didn't wont to tell much more, he just wanted the to call to discuss it. Please call patient back.

## 2019-03-17 NOTE — Telephone Encounter (Signed)
Call placed to the patient. He stated that he has been off his Lipitor since March, "statin holiday", to see if the discomfort in his legs would get any better. He is also scheduled for an Aorta and Abi on 7/9   He stated that he has not really seen much of a difference in the pain but may want to go on the [place]. He would like to have the duplex completed first and then discuss with Dr Gwenlyn Found about whether he should go back on Lipitor or start Repatha.

## 2019-03-17 NOTE — Telephone Encounter (Signed)
That’s fine with me

## 2019-03-31 ENCOUNTER — Other Ambulatory Visit (HOSPITAL_COMMUNITY): Payer: Self-pay | Admitting: Cardiovascular Disease

## 2019-03-31 DIAGNOSIS — I701 Atherosclerosis of renal artery: Secondary | ICD-10-CM | POA: Diagnosis not present

## 2019-03-31 DIAGNOSIS — I350 Nonrheumatic aortic (valve) stenosis: Secondary | ICD-10-CM | POA: Diagnosis not present

## 2019-03-31 DIAGNOSIS — I129 Hypertensive chronic kidney disease with stage 1 through stage 4 chronic kidney disease, or unspecified chronic kidney disease: Secondary | ICD-10-CM | POA: Diagnosis not present

## 2019-03-31 DIAGNOSIS — E1122 Type 2 diabetes mellitus with diabetic chronic kidney disease: Secondary | ICD-10-CM | POA: Diagnosis not present

## 2019-03-31 DIAGNOSIS — N183 Chronic kidney disease, stage 3 (moderate): Secondary | ICD-10-CM | POA: Diagnosis not present

## 2019-03-31 DIAGNOSIS — I739 Peripheral vascular disease, unspecified: Secondary | ICD-10-CM

## 2019-03-31 DIAGNOSIS — I272 Pulmonary hypertension, unspecified: Secondary | ICD-10-CM | POA: Diagnosis not present

## 2019-03-31 DIAGNOSIS — R252 Cramp and spasm: Secondary | ICD-10-CM | POA: Diagnosis not present

## 2019-04-10 ENCOUNTER — Encounter (INDEPENDENT_AMBULATORY_CARE_PROVIDER_SITE_OTHER): Payer: Self-pay

## 2019-04-10 ENCOUNTER — Ambulatory Visit (HOSPITAL_COMMUNITY)
Admission: RE | Admit: 2019-04-10 | Discharge: 2019-04-10 | Disposition: A | Discharge status: Home / Self Care | Payer: Medicare Other | Source: Ambulatory Visit | Attending: Cardiology | Admitting: Cardiology

## 2019-04-10 ENCOUNTER — Ambulatory Visit (HOSPITAL_BASED_OUTPATIENT_CLINIC_OR_DEPARTMENT_OTHER)
Admission: RE | Admit: 2019-04-10 | Discharge: 2019-04-10 | Disposition: A | Discharge status: Home / Self Care | Payer: Medicare Other | Source: Ambulatory Visit | Attending: Cardiovascular Disease | Admitting: Cardiovascular Disease

## 2019-04-10 ENCOUNTER — Other Ambulatory Visit: Payer: Self-pay

## 2019-04-10 ENCOUNTER — Ambulatory Visit (HOSPITAL_BASED_OUTPATIENT_CLINIC_OR_DEPARTMENT_OTHER): Payer: Medicare Other

## 2019-04-10 DIAGNOSIS — I739 Peripheral vascular disease, unspecified: Secondary | ICD-10-CM | POA: Diagnosis not present

## 2019-04-10 DIAGNOSIS — I350 Nonrheumatic aortic (valve) stenosis: Secondary | ICD-10-CM

## 2019-04-11 ENCOUNTER — Telehealth: Payer: Self-pay | Admitting: Cardiovascular Disease

## 2019-04-11 ENCOUNTER — Other Ambulatory Visit: Payer: Self-pay

## 2019-04-11 ENCOUNTER — Telehealth: Payer: Self-pay

## 2019-04-11 DIAGNOSIS — I350 Nonrheumatic aortic (valve) stenosis: Secondary | ICD-10-CM

## 2019-04-11 NOTE — Telephone Encounter (Signed)
    COVID-19 Pre-Screening Questions:  . In the past 7 to 10 days have you had a cough,  shortness of breath, headache, congestion, fever (100 or greater) body aches, chills, sore throat, or sudden loss of taste or sense of smell? NO . Have you been around anyone with known Covid 19? NO . Have you been around anyone who is awaiting Covid 19 test results in the past 7 to 10 days? NO . Have you been around anyone who has been exposed to Covid 19, or has mentioned symptoms of Covid 19 within the past 7 to 10 days? NO  If you have any concerns/questions about symptoms patients report during screening (either on the phone or at threshold). Contact the provider seeing the patient or DOD for further guidance.  If neither are available contact a member of the leadership team.   Returned call to pt wife regarding ultrasound results. She would like to set pt up for OV with Dr.Berry to discuss results. Spoke with pt about COVID-19, mandatory mask donning, and restrictions for guests. Pt aware of appt set for 7/17 at 1:30pm. Pt verbalized understanding. Explained telephone call process during OV for guests that would like to listen in on appt. Pt wife agreeable.

## 2019-04-11 NOTE — Telephone Encounter (Signed)
New Message    Patients wife is calling on his behalf. They are calling to obtain doppler results. Please call to discuss.

## 2019-04-17 ENCOUNTER — Telehealth: Payer: Self-pay | Admitting: Cardiovascular Disease

## 2019-04-17 NOTE — Telephone Encounter (Signed)

## 2019-04-18 ENCOUNTER — Encounter: Payer: Self-pay | Admitting: Cardiovascular Disease

## 2019-04-18 ENCOUNTER — Telehealth: Payer: Self-pay | Admitting: Pharmacist

## 2019-04-18 ENCOUNTER — Ambulatory Visit (INDEPENDENT_AMBULATORY_CARE_PROVIDER_SITE_OTHER): Payer: Medicare Other | Admitting: Cardiovascular Disease

## 2019-04-18 ENCOUNTER — Other Ambulatory Visit: Payer: Self-pay

## 2019-04-18 VITALS — BP 138/58 | HR 48 | Temp 97.3°F | Ht 68.0 in | Wt 188.0 lb

## 2019-04-18 DIAGNOSIS — I10 Essential (primary) hypertension: Secondary | ICD-10-CM

## 2019-04-18 DIAGNOSIS — I2510 Atherosclerotic heart disease of native coronary artery without angina pectoris: Secondary | ICD-10-CM

## 2019-04-18 DIAGNOSIS — I350 Nonrheumatic aortic (valve) stenosis: Secondary | ICD-10-CM | POA: Diagnosis not present

## 2019-04-18 DIAGNOSIS — I701 Atherosclerosis of renal artery: Secondary | ICD-10-CM

## 2019-04-18 DIAGNOSIS — I739 Peripheral vascular disease, unspecified: Secondary | ICD-10-CM | POA: Diagnosis not present

## 2019-04-18 DIAGNOSIS — E782 Mixed hyperlipidemia: Secondary | ICD-10-CM | POA: Diagnosis not present

## 2019-04-18 DIAGNOSIS — I779 Disorder of arteries and arterioles, unspecified: Secondary | ICD-10-CM

## 2019-04-18 NOTE — Assessment & Plan Note (Signed)
History of essential hypertension with blood pressure measured today 138/58.  He is on losartan and metoprolol.

## 2019-04-18 NOTE — Telephone Encounter (Signed)
Patient previously on atorvastatin 80mg  with good LDL control, but unable to continue therapy due to severe muscle pain.   04/18/2019: pending  08/20/2018:  cholesterol 130, LDL of 59 and HDL 40 on atorvastatin 80mg  daily   Samples Repatha 140mg  given to the patient for self-administration during office visit, quantity 1 Lot Number [id number], Exp 10/22

## 2019-04-18 NOTE — Assessment & Plan Note (Addendum)
History of hyperlipidemia previously on high-dose atorvastatin which it turns out he is intolerant to because of hip pain .  He is on Zetia as well.  Lipid profile performed 08/20/2018 revealing a total cholesterol 130, LDL of 59 and HDL 40.  He may be a candidate for Repatha.

## 2019-04-18 NOTE — Addendum Note (Signed)
Addended by: Annita Brod on: 04/18/2019 02:34 PM   Modules accepted: Orders

## 2019-04-18 NOTE — Assessment & Plan Note (Signed)
History of moderate aortic stenosis by recent 2D echo performed 04/10/2019 with normal LV function, valve area 1.1 cm with a peak gradient of 31 mmHg.  We will repeat this in 12 months.  He has no symptoms of chest pain or shortness of breath.

## 2019-04-18 NOTE — Patient Instructions (Addendum)
Medication Instructions:  Your physician recommends that you continue on your current medications as directed. Please refer to the Current Medication list given to you today.  If you need a refill on your cardiac medications before your next appointment, please call your pharmacy.   Lab work: Your physician recommends that you return for lab work TODAY; LIPID PANEL If you have labs (blood work) drawn today and your tests are completely normal, you will receive your results only by: Marland Kitchen MyChart Message (if you have MyChart) OR . A paper copy in the mail If you have any lab test that is abnormal or we need to change your treatment, we will call you to review the results.  Testing/Procedures: Your physician has requested that you have a carotid duplex. This test is an ultrasound of the carotid arteries in your neck. It looks at blood flow through these arteries that supply the brain with blood. Allow one hour for this exam. There are no restrictions or special instructions. TO BE SCHEDULED 12 MONTHS   Your physician has requested that you have a renal artery duplex. During this test, an ultrasound is used to evaluate blood flow to the kidneys. Allow one hour for this exam. Do not eat after midnight the day before and avoid carbonated beverages. Take your medications as you usually do. TO BE SCHEDULED 12 MONTHS  Your physician has requested that you have an echocardiogram. Echocardiography is a painless test that uses sound waves to create images of your heart. It provides your doctor with information about the size and shape of your heart and how well your heart's chambers and valves are working. This procedure takes approximately one hour. There are no restrictions for this procedure. TO BE SCHEDULED 12 MONTHS  Follow-Up: At [facility], you and your health needs are our priority.  As part of our continuing mission to provide you with exceptional heart care, we have created designated Provider  Care Teams.  These Care Teams include your primary Cardiologist (physician) and Advanced Practice Providers (APPs -  Physician Assistants and Nurse Practitioners) who all work together to provide you with the care you need, when you need it. You will need a follow up appointment in 12 months with Dr. Gwenlyn Found.  Please call our office 2 months in advance to schedule this appointment.

## 2019-04-18 NOTE — Assessment & Plan Note (Signed)
History of bilateral renal artery stenosis by angiography and CTA status post left renal artery stenting using a 6 mm x 15 mm long balloon expandable stent 2017.  Follow-up Dopplers showed this to be widely patent.

## 2019-04-18 NOTE — Assessment & Plan Note (Signed)
History of CAD status post LAD and diagonal branch stenting which I performed 07/14/2003 and subsequent stenting by Dr. Melvern Banker 07/31/2003.

## 2019-04-18 NOTE — Progress Notes (Signed)
04/18/2019 Eric [name]   Apr 17, 1936  [id number]  Primary Physician Shon Baton, MD Primary Cardiologist: Lorretta Harp MD Lupe Carney, [place]  HPI:  Eric [name] is a 83 y.o.  mildly to moderately overweight married Caucasian male father of 70, grandfather to 8 grandchildren who is accompanied by his wife today as usual. I last saw him in the office  12/06/2018.Marland Kitchen He has a history of CAD and PVOD. I stented his LAD and diagonal branch back on July 14, 2003 and subsequent stenting by Dr. Melvern Banker July 31, 2003. He had mild bilateral renal artery stenosis at cath with blockages in the 40% range as well as moderate right internal carotid artery stenosis by duplex ultrasound. He was neurologic asymptomatic. His other problems include hypertension and hyperlipidemia. He has obstructive sleep apnea followed by Dr. Claiborne Billings. His most recent lab work revealed a total cholesterol of 125, LDL of 58 and HDL of 36. Carotid Dopplers performed in September 2016 revealed moderate right ICA stenosis, unchanged from a prior study and renal Dopplers performed 01/05/16 showed progression of bilateral renal artery stenosis. CT angiography performed 02/04/16 that showed high-grade ostial left renal artery stenosis with probable right renal artery stenosis as well. The patient underwent left renal artery PTA and stenting using a 6 mm x 15 mm long balloon expandable stent with excellent angiographic result. His follow-up Dopplers performed on1/15/18showed marked improvement in his velocities and renal aortic ratios.Since I saw me regarding his remaining currently stable. He does have moderate aortic stenosis by 2-D echo performed 10/15/2017 the valve area 1 cm and a peak gradient of 34 mmHg.   Recent noninvasive tests show stable right ICA stenosis, patent left renal artery stent with moderate right renal artery stenosis and non-compressible vessels not allowing the determination of ABIs.  He does complain of  pain in his hips and legs which does not sound like claudication.  It may be statin related and we talked about starting a statin holiday for 3 months.  Since stopping his high-dose statin several months ago his lower extremity pain and hip pain has significantly improved suggesting this was a statin intolerance.  We will explore the possibility of using Repatha.  2D echo revealed normal LV function with a valve area of 1.1 cm and a peak gradient of 31 mmHg although the patient is asymptomatic from this.    Current Meds  Medication Sig  . aspirin 81 MG tablet Take 81 mg by mouth daily.    Marland Kitchen atorvastatin (LIPITOR) 80 MG tablet TAKE 1 TABLET BY MOUTH EVERY DAY AT 6 PM  . clopidogrel (PLAVIX) 75 MG tablet TAKE 1 TABLET BY MOUTH EVERY DAY  . diazepam (VALIUM) 5 MG tablet TAKE 1 TABLET EVERY 8 HOURS AS NEEDED (Patient taking differently: Take 5 mg by mouth 2 (two) times daily. TAKE 1 TABLET EVERY 8 HOURS AS NEEDED)  . ezetimibe (ZETIA) 10 MG tablet TAKE 1 TABLET BY MOUTH DAILY  . fluticasone (FLONASE) 50 MCG/ACT nasal spray Place 2 sprays into both nostrils daily.  . furosemide (LASIX) 20 MG tablet Take 20 mg by mouth as needed (AS NEEDED FOR SWELLING).  Marland Kitchen losartan (COZAAR) 100 MG tablet Take 100 mg by mouth daily.  . meclizine (ANTIVERT) 25 MG tablet TAKE ONE TABLET BY MOUTH EVERY 8 HOURS AS NEEDED FOR DIZZINESS  . metoprolol tartrate (LOPRESSOR) 50 MG tablet TAKE 1.5 TABLETS BY MOUTH TWICE DAILY  . nitroGLYCERIN (NITROSTAT) 0.4 MG SL tablet Place 1  tablet (0.4 mg total) under the tongue every 5 (five) minutes as needed for chest pain.  . pantoprazole (PROTONIX) 40 MG tablet Take 1 tablet (40 mg total) by mouth daily.     Allergies  Allergen Reactions  . Niaspan [Niacin Er]   . Sertraline Hcl     REACTION: disorientation  . Tetanus Toxoids     Social History   Socioeconomic History  . Marital status: Married    Spouse name: ruth  . Number of children: 4  . Years of education: Not on  file  . Highest education level: Not on file  Occupational History  . Occupation: Retired  Scientific laboratory technician  . Financial resource strain: Not on file  . Food insecurity    Worry: Not on file    Inability: Not on file  . Transportation needs    Medical: Not on file    Non-medical: Not on file  Tobacco Use  . Smoking status: Former Smoker    Packs/day: 1.00    Years: 15.00    Pack years: 15.00    Types: Cigarettes    Quit date: 10/02/1964    Years since quitting: 54.5  . Smokeless tobacco: Never Used  Substance and Sexual Activity  . Alcohol use: Yes    Comment: 03/20/2016 "haven't had a drink since the 1990s"  . Drug use: No  . Sexual activity: Not Currently  Lifestyle  . Physical activity    Days per week: Not on file    Minutes per session: Not on file  . Stress: Not on file  Relationships  . Social Herbalist on phone: Not on file    Gets together: Not on file    Attends religious service: Not on file    Active member of club or organization: Not on file    Attends meetings of clubs or organizations: Not on file    Relationship status: Not on file  . Intimate partner violence    Fear of current or ex partner: Not on file    Emotionally abused: Not on file    Physically abused: Not on file    Forced sexual activity: Not on file  Other Topics Concern  . Not on file  Social History Narrative  . Not on file     Review of Systems: General: negative for chills, fever, night sweats or weight changes.  Cardiovascular: negative for chest pain, dyspnea on exertion, edema, orthopnea, palpitations, paroxysmal nocturnal dyspnea or shortness of breath Dermatological: negative for rash Respiratory: negative for cough or wheezing Urologic: negative for hematuria Abdominal: negative for nausea, vomiting, diarrhea, bright red blood per rectum, melena, or hematemesis Neurologic: negative for visual changes, syncope, or dizziness All other systems reviewed and are otherwise  negative except as noted above.    Blood pressure (!) 138/58, pulse (!) 48, temperature (!) 97.3 F (36.3 C), height 5\' 8"  (1.727 m), weight 188 lb (85.3 kg), SpO2 95 %.  General appearance: alert and no distress Neck: no adenopathy, no JVD, supple, symmetrical, trachea midline, thyroid not enlarged, symmetric, no tenderness/mass/nodules and Bilateral carotid bruits versus transmitted murmur Lungs: clear to auscultation bilaterally Heart: 2/6 outflow tract murmur consistent with aortic stenosis Extremities: extremities normal, atraumatic, no cyanosis or edema Pulses: 2+ and symmetric Skin: Skin color, texture, turgor normal. No rashes or lesions Neurologic: Alert and oriented X 3, normal strength and tone. Normal symmetric reflexes. Normal coordination and gait  EKG not performed today  ASSESSMENT AND PLAN:  Hyperlipidemia History of hyperlipidemia previously on high-dose atorvastatin which it turns out he is intolerant to because of hip pain .  He is on Zetia as well.  Lipid profile performed 08/20/2018 revealing a total cholesterol 130, LDL of 59 and HDL 40.  He may be a candidate for Repatha.  Essential hypertension History of essential hypertension with blood pressure measured today 138/58.  He is on losartan and metoprolol.  Coronary atherosclerosis History of CAD status post LAD and diagonal branch stenting which I performed 07/14/2003 and subsequent stenting by Dr. Melvern Banker 07/31/2003.  Bilateral renal artery stenosis (HCC) History of bilateral renal artery stenosis by angiography and CTA status post left renal artery stenting using a 6 mm x 15 mm long balloon expandable stent 2017.  Follow-up Dopplers showed this to be widely patent.  Aortic stenosis, moderate History of moderate aortic stenosis by recent 2D echo performed 04/10/2019 with normal LV function, valve area 1.1 cm with a peak gradient of 31 mmHg.  We will repeat this in 12 months.  He has no symptoms of chest pain or  shortness of breath.      Lorretta Harp MD FACP,FACC,FAHA, [facility] 04/18/2019 1:41 PM

## 2019-04-19 LAB — LIPID PANEL
Chol/HDL Ratio: 5.6 ratio — ABNORMAL HIGH (ref 0.0–5.0)
Cholesterol, Total: 217 mg/dL — ABNORMAL HIGH (ref 100–199)
HDL: 39 mg/dL — ABNORMAL LOW (ref >39)
LDL Calculated: 146 mg/dL — ABNORMAL HIGH (ref 0–99)
Triglycerides: 162 mg/dL — ABNORMAL HIGH (ref 0–149)
VLDL Cholesterol Cal: 32 mg/dL (ref 5–40)

## 2019-04-25 ENCOUNTER — Telehealth: Payer: Self-pay | Admitting: Cardiovascular Disease

## 2019-04-25 NOTE — Telephone Encounter (Signed)
Prior authorization submitted , waiting for response. Update given to patient today.  Will follow up as needed.

## 2019-04-25 NOTE — Telephone Encounter (Signed)
Patient's wife called stating he was in last week and they took him off of lipitor and changed his medication, someone was going to get back to him about medication being covered by insurance she wants to know what is that status of that.

## 2019-04-29 ENCOUNTER — Telehealth: Payer: Self-pay | Admitting: Pharmacist Clinician (PhC)/ Clinical Pharmacy Specialist

## 2019-04-29 MED ORDER — PRALUENT 150 MG/ML ~~LOC~~ SOAJ: 150.0000 mg | SUBCUTANEOUS | 12 refills | Status: DC

## 2019-04-29 NOTE — Telephone Encounter (Signed)
Spoke with patient.  Explained the differences between [place] and [place] - pt was given sample of Repatha in office, however insurance will only cover Praluent.  Patient voiced understanding, will call back if cost prohibitive.

## 2019-05-06 NOTE — Telephone Encounter (Signed)
Patient called back, would like to speak you.

## 2019-05-06 NOTE — Telephone Encounter (Signed)
Spoke with patient.  He has applied for [employer], but not had response back.  Currently past due for Praluent injection.  Will give him one sample for now, he is to reach out to [place] if he doesn't hear from them in the next week.    Praluent 150 mg  Lot [id number] Exp 07/02/2019

## 2019-05-19 ENCOUNTER — Telehealth: Payer: Self-pay | Admitting: *Deleted

## 2019-05-19 NOTE — Telephone Encounter (Signed)
Returned call to patient.  He had his daughter in law sign him up for [facility], but has not heard anything since being approved online.     Downloaded card information:   [id number] 04/30/2019 03/29/2020 [id number] PDMI  Will share information with patient - he can submit to [facility] for billing

## 2019-05-19 NOTE — Telephone Encounter (Signed)
Request to speak with pharmacist. Says he was transferred to [facility] office.

## 2019-06-12 DIAGNOSIS — Z23 Encounter for immunization: Secondary | ICD-10-CM | POA: Diagnosis not present

## 2019-07-18 ENCOUNTER — Telehealth: Payer: Self-pay | Admitting: Pharmacist Clinician (PhC)/ Clinical Pharmacy Specialist

## 2019-07-18 ENCOUNTER — Other Ambulatory Visit: Payer: Self-pay | Admitting: Pharmacist Clinician (PhC)/ Clinical Pharmacy Specialist

## 2019-07-18 DIAGNOSIS — E782 Mixed hyperlipidemia: Secondary | ICD-10-CM

## 2019-07-18 NOTE — Telephone Encounter (Signed)
Patient on Praluent since August.  Lab orders mailed to repeat lipids.  Pt aware.

## 2019-07-28 DIAGNOSIS — E782 Mixed hyperlipidemia: Secondary | ICD-10-CM | POA: Diagnosis not present

## 2019-07-28 LAB — LIPID PANEL
Chol/HDL Ratio: 2.7 ratio (ref 0.0–5.0)
Cholesterol, Total: 108 mg/dL (ref 100–199)
HDL: 40 mg/dL (ref >39)
LDL Chol Calc (NIH): 47 mg/dL (ref 0–99)
Triglycerides: 116 mg/dL (ref 0–149)
VLDL Cholesterol Cal: 21 mg/dL (ref 5–40)

## 2019-07-29 ENCOUNTER — Encounter: Payer: Self-pay | Admitting: *Deleted

## 2019-07-30 ENCOUNTER — Telehealth: Payer: Self-pay | Admitting: Cardiovascular Disease

## 2019-07-30 NOTE — Telephone Encounter (Signed)
New Message  Patient is wanting a call back to go over lab results. Please give patient's wife a call back.

## 2019-07-30 NOTE — Telephone Encounter (Signed)
Spoke with pt wife who was calling concerning results of pt most recent lab work. Pt on Repatha. Reviewed 10/26 lipid panel results with pt wife. She requests that these results be faxed to pt PCP Shon Baton, MD. No further questions/concerns.  Lipid panel results forwarded to Shon Baton, MD via Epic fax function. Letter of results was mailed to pt address on 10/27

## 2019-08-04 ENCOUNTER — Telehealth: Payer: Self-pay | Admitting: Pharmacist Clinician (PhC)/ Clinical Pharmacy Specialist

## 2019-08-04 NOTE — Telephone Encounter (Signed)
Patient called to report injection site irritation with last Repatha dose.  Had given previous doses in abdomen with no adverse issues.  Gave this dose in upper thigh.    Patient reports rash was in 2-3" area around injection site, has cleared up mostly since the injection was given.  Advised he do next injection in abdomen again and if irritation returns to call office.    Patient voiced understanding.

## 2019-09-09 DIAGNOSIS — H02831 Dermatochalasis of right upper eyelid: Secondary | ICD-10-CM | POA: Diagnosis not present

## 2019-09-09 DIAGNOSIS — H02834 Dermatochalasis of left upper eyelid: Secondary | ICD-10-CM | POA: Diagnosis not present

## 2019-09-09 DIAGNOSIS — Z961 Presence of intraocular lens: Secondary | ICD-10-CM | POA: Diagnosis not present

## 2019-09-09 DIAGNOSIS — H43392 Other vitreous opacities, left eye: Secondary | ICD-10-CM | POA: Diagnosis not present

## 2019-10-04 DIAGNOSIS — Z9189 Other specified personal risk factors, not elsewhere classified: Secondary | ICD-10-CM | POA: Diagnosis not present

## 2019-10-04 DIAGNOSIS — Z20828 Contact with and (suspected) exposure to other viral communicable diseases: Secondary | ICD-10-CM | POA: Diagnosis not present

## 2019-10-24 ENCOUNTER — Ambulatory Visit: Payer: Medicare Other | Attending: Internal Medicine

## 2019-10-24 DIAGNOSIS — Z23 Encounter for immunization: Secondary | ICD-10-CM | POA: Insufficient documentation

## 2019-10-24 NOTE — Progress Notes (Signed)
   Covid-19 Vaccination Clinic  Name:  Eric [name]    MRN: [id number] DOB: 1935/12/13  10/24/2019  [name] was observed post Covid-19 immunization for 30 minutes without incidence. He was provided with Vaccine Information Sheet and instruction to access the V-Safe system.   [name] was instructed to call 911 with any severe reactions post vaccine: Marland Kitchen Difficulty breathing  . Swelling of your face and throat  . A fast heartbeat  . A bad rash all over your body  . Dizziness and weakness    Immunizations Administered    Name Date Dose VIS Date Route   Pfizer COVID-19 Vaccine 10/24/2019 11:37 AM 0.3 mL 09/12/2019 Intramuscular   Manufacturer: [place]   Lot: [id number]   [place]: [id number]

## 2019-11-14 ENCOUNTER — Ambulatory Visit: Payer: Medicare Other | Attending: Internal Medicine

## 2019-11-14 DIAGNOSIS — Z23 Encounter for immunization: Secondary | ICD-10-CM | POA: Insufficient documentation

## 2019-11-14 NOTE — Progress Notes (Signed)
   Covid-19 Vaccination Clinic  Name:  Eric [name]    MRN: [id number] DOB: 11/28/1935  11/14/2019  Mr. Brodrick was observed post Covid-19 immunization for 30 minutes based on pre-vaccination screening without incidence. He was provided with Vaccine Information Sheet and instruction to access the V-Safe system.   Mr. Garinger was instructed to call 911 with any severe reactions post vaccine: Marland Kitchen Difficulty breathing  . Swelling of your face and throat  . A fast heartbeat  . A bad rash all over your body  . Dizziness and weakness    Immunizations Administered    Name Date Dose VIS Date Route   Pfizer COVID-19 Vaccine 11/14/2019 11:35 AM 0.3 mL 09/12/2019 Intramuscular   Manufacturer: [place]   Lot: [id number]   [place]: [id number]

## 2019-11-17 ENCOUNTER — Encounter (HOSPITAL_COMMUNITY): Payer: Medicare Other

## 2019-11-18 ENCOUNTER — Other Ambulatory Visit: Payer: Self-pay | Admitting: Cardiovascular Disease

## 2019-11-25 DIAGNOSIS — I2510 Atherosclerotic heart disease of native coronary artery without angina pectoris: Secondary | ICD-10-CM | POA: Diagnosis not present

## 2019-11-25 DIAGNOSIS — E1122 Type 2 diabetes mellitus with diabetic chronic kidney disease: Secondary | ICD-10-CM | POA: Diagnosis not present

## 2019-11-25 DIAGNOSIS — N1831 Chronic kidney disease, stage 3a: Secondary | ICD-10-CM | POA: Diagnosis not present

## 2019-11-25 DIAGNOSIS — I209 Angina pectoris, unspecified: Secondary | ICD-10-CM | POA: Diagnosis not present

## 2019-11-25 DIAGNOSIS — G4733 Obstructive sleep apnea (adult) (pediatric): Secondary | ICD-10-CM | POA: Diagnosis not present

## 2019-11-25 DIAGNOSIS — I129 Hypertensive chronic kidney disease with stage 1 through stage 4 chronic kidney disease, or unspecified chronic kidney disease: Secondary | ICD-10-CM | POA: Diagnosis not present

## 2019-11-25 DIAGNOSIS — I701 Atherosclerosis of renal artery: Secondary | ICD-10-CM | POA: Diagnosis not present

## 2019-11-25 DIAGNOSIS — E785 Hyperlipidemia, unspecified: Secondary | ICD-10-CM | POA: Diagnosis not present

## 2019-11-25 DIAGNOSIS — I739 Peripheral vascular disease, unspecified: Secondary | ICD-10-CM | POA: Diagnosis not present

## 2019-11-25 DIAGNOSIS — I272 Pulmonary hypertension, unspecified: Secondary | ICD-10-CM | POA: Diagnosis not present

## 2019-11-25 DIAGNOSIS — I350 Nonrheumatic aortic (valve) stenosis: Secondary | ICD-10-CM | POA: Diagnosis not present

## 2019-11-25 DIAGNOSIS — R252 Cramp and spasm: Secondary | ICD-10-CM | POA: Diagnosis not present

## 2019-12-01 ENCOUNTER — Ambulatory Visit (HOSPITAL_BASED_OUTPATIENT_CLINIC_OR_DEPARTMENT_OTHER)
Admission: RE | Admit: 2019-12-01 | Discharge: 2019-12-01 | Disposition: A | Discharge status: Home / Self Care | Payer: Medicare Other | Source: Ambulatory Visit | Attending: Cardiovascular Disease | Admitting: Cardiovascular Disease

## 2019-12-01 ENCOUNTER — Ambulatory Visit (HOSPITAL_COMMUNITY)
Admission: RE | Admit: 2019-12-01 | Discharge: 2019-12-01 | Disposition: A | Discharge status: Home / Self Care | Payer: Medicare Other | Source: Ambulatory Visit | Attending: Cardiovascular Disease | Admitting: Cardiovascular Disease

## 2019-12-01 ENCOUNTER — Other Ambulatory Visit: Payer: Self-pay

## 2019-12-01 DIAGNOSIS — I739 Peripheral vascular disease, unspecified: Secondary | ICD-10-CM | POA: Diagnosis not present

## 2019-12-01 DIAGNOSIS — I779 Disorder of arteries and arterioles, unspecified: Secondary | ICD-10-CM

## 2019-12-01 DIAGNOSIS — I701 Atherosclerosis of renal artery: Secondary | ICD-10-CM | POA: Insufficient documentation

## 2019-12-03 ENCOUNTER — Telehealth: Payer: Self-pay

## 2019-12-03 ENCOUNTER — Telehealth: Payer: Self-pay | Admitting: Cardiovascular Disease

## 2019-12-03 NOTE — Telephone Encounter (Signed)
Called the pt and made them aware that they need to call [facility] [employer] directly. Pt voiced undertanding.

## 2019-12-03 NOTE — Telephone Encounter (Signed)
Patient's wife calling for test results.

## 2019-12-04 NOTE — Telephone Encounter (Signed)
Spoke with patient's spouse. Discussed Carotid and Renal studies. Spouse verbalized understanding. Yearly follow up scheduled for April 6th at 2pm.

## 2019-12-04 NOTE — Telephone Encounter (Signed)
Follow up    Patients wife is calling back to obtain doppler study test results. Please call.

## 2019-12-04 NOTE — Telephone Encounter (Signed)
Spoke w/pt and instructed them to come pick up a months worth of samples. Pt voiced understanding

## 2019-12-12 NOTE — Telephone Encounter (Signed)
Patient [facility] requesting call back. Needs help with paperwork for Praluent.

## 2019-12-15 NOTE — Telephone Encounter (Signed)
Pt called and clarified that the last time they [facility] was feb

## 2019-12-15 NOTE — Telephone Encounter (Signed)
Called the pt back to discuss the need for more samples. I instructed the pt to call us back because I am concerned due to the fact that the [place] website displays that he picked up meds 12/02/19 after receiving a month worth of samples from [place]. Meaning the pt has two month of meds at home.

## 2019-12-29 ENCOUNTER — Telehealth: Payer: Self-pay

## 2019-12-29 NOTE — Telephone Encounter (Signed)
Pt called in and stated that they needed samples. I told the pt that I could sample them out the repatha instead since we didn't have any 150mg  praluent on hand and the pt voiced understanding

## 2020-01-01 DIAGNOSIS — E7849 Other hyperlipidemia: Secondary | ICD-10-CM | POA: Diagnosis not present

## 2020-01-01 DIAGNOSIS — E1122 Type 2 diabetes mellitus with diabetic chronic kidney disease: Secondary | ICD-10-CM | POA: Diagnosis not present

## 2020-01-06 ENCOUNTER — Ambulatory Visit: Payer: Medicare Other | Admitting: Cardiovascular Disease

## 2020-01-14 ENCOUNTER — Encounter: Payer: Self-pay | Admitting: Podiatry

## 2020-01-14 ENCOUNTER — Ambulatory Visit (INDEPENDENT_AMBULATORY_CARE_PROVIDER_SITE_OTHER): Payer: Medicare Other

## 2020-01-14 ENCOUNTER — Other Ambulatory Visit: Payer: Self-pay | Admitting: Podiatry

## 2020-01-14 ENCOUNTER — Ambulatory Visit (INDEPENDENT_AMBULATORY_CARE_PROVIDER_SITE_OTHER): Payer: Medicare Other | Admitting: Podiatry

## 2020-01-14 ENCOUNTER — Other Ambulatory Visit: Payer: Self-pay

## 2020-01-14 DIAGNOSIS — Q828 Other specified congenital malformations of skin: Secondary | ICD-10-CM | POA: Diagnosis not present

## 2020-01-14 DIAGNOSIS — M216X9 Other acquired deformities of unspecified foot: Secondary | ICD-10-CM

## 2020-01-14 DIAGNOSIS — M216X1 Other acquired deformities of right foot: Secondary | ICD-10-CM

## 2020-01-14 DIAGNOSIS — I701 Atherosclerosis of renal artery: Secondary | ICD-10-CM | POA: Diagnosis not present

## 2020-01-14 DIAGNOSIS — M79671 Pain in right foot: Secondary | ICD-10-CM

## 2020-01-14 DIAGNOSIS — D689 Coagulation defect, unspecified: Secondary | ICD-10-CM | POA: Diagnosis not present

## 2020-01-16 NOTE — Progress Notes (Signed)
Subjective:   Patient ID: Eric [name], male   DOB: 84 y.o.   MRN: [id number]   HPI Patient presents with generalized foot pain with abnormal foot structure and lesions that occur secondary to pressure.  States is been going on for a while and is gotten worse recently and patient does not currently smoke likes to be active   Review of Systems  All other systems reviewed and are negative.       Objective:  Physical Exam Vitals and nursing note reviewed.  Constitutional:      Appearance: He is well-developed.  Pulmonary:     Effort: Pulmonary effort is normal.  Musculoskeletal:        General: Normal range of motion.  Skin:    General: Skin is warm.  Neurological:     Mental Status: He is alert.     Neurovascular status was found to be intact with muscle strength found to be adequate.  Patient is noted to have lesions underneath both feet that become sore secondary to bone pressure with diminished fat pad also noted associated with condition.  Patient is on blood thinner and is at risk for taking care of this himself and does try to use cushion type over-the-counter insoles.  Patient has good digital perfusion well oriented      Assessment:  Significant foot structural issues bilateral with cavus foot structure diminished fat pad porokeratotic painful lesions and at risk condition with patient on blood thinner     Plan:  H&P all conditions reviewed and today sterile sharp debridement accomplished no iatrogenic bleeding discussed different types of padding mechanisms and shoe gear.  Patient will be seen back as needed may require orthotics or other treatment depending on response  X-rays right indicate mild osteoporosis arthritis but no indications of stress fracture advanced arthritis

## 2020-01-23 DIAGNOSIS — R05 Cough: Secondary | ICD-10-CM | POA: Diagnosis not present

## 2020-01-23 DIAGNOSIS — J019 Acute sinusitis, unspecified: Secondary | ICD-10-CM | POA: Diagnosis not present

## 2020-01-23 DIAGNOSIS — J209 Acute bronchitis, unspecified: Secondary | ICD-10-CM | POA: Diagnosis not present

## 2020-01-30 ENCOUNTER — Encounter: Payer: Self-pay | Admitting: Cardiovascular Disease

## 2020-01-30 ENCOUNTER — Other Ambulatory Visit: Payer: Self-pay

## 2020-01-30 ENCOUNTER — Ambulatory Visit (INDEPENDENT_AMBULATORY_CARE_PROVIDER_SITE_OTHER): Payer: Medicare Other | Admitting: Cardiovascular Disease

## 2020-01-30 DIAGNOSIS — E782 Mixed hyperlipidemia: Secondary | ICD-10-CM

## 2020-01-30 DIAGNOSIS — G4733 Obstructive sleep apnea (adult) (pediatric): Secondary | ICD-10-CM | POA: Diagnosis not present

## 2020-01-30 DIAGNOSIS — I2510 Atherosclerotic heart disease of native coronary artery without angina pectoris: Secondary | ICD-10-CM | POA: Diagnosis not present

## 2020-01-30 DIAGNOSIS — I701 Atherosclerosis of renal artery: Secondary | ICD-10-CM | POA: Diagnosis not present

## 2020-01-30 DIAGNOSIS — I10 Essential (primary) hypertension: Secondary | ICD-10-CM

## 2020-01-30 DIAGNOSIS — I739 Peripheral vascular disease, unspecified: Secondary | ICD-10-CM | POA: Diagnosis not present

## 2020-01-30 DIAGNOSIS — I350 Nonrheumatic aortic (valve) stenosis: Secondary | ICD-10-CM

## 2020-01-30 NOTE — Assessment & Plan Note (Signed)
History of obstructive sleep apnea on CPAP followed by Dr. Kelly 

## 2020-01-30 NOTE — Assessment & Plan Note (Signed)
History of essential hypertension blood pressure measured today at 130/62.  He is on metoprolol and losartan.

## 2020-01-30 NOTE — Assessment & Plan Note (Signed)
History of moderate aortic stenosis by 2D echo 10/05/2017 with preserved EF.  We will repeat his 2D echocardiogram.

## 2020-01-30 NOTE — Assessment & Plan Note (Signed)
History of hyperlipidemia on Praluent and Zetia with lipid profile performed 01/01/2020 revealing total cholesterol 118, LDL of 54 and HDL 37.

## 2020-01-30 NOTE — Patient Instructions (Signed)
Medication Instructions:  The current medical regimen is effective;  continue present plan and medications.  *If you need a refill on your cardiac medications before your next appointment, please call your pharmacy*  Testing/Procedures: Echocardiogram - Your physician has requested that you have an echocardiogram. Echocardiography is a painless test that uses sound waves to create images of your heart. It provides your doctor with information about the size and shape of your heart and how well your heart's chambers and valves are working. This procedure takes approximately one hour. There are no restrictions for this procedure. This will be performed at our [address] location - [address], [address].    Follow-Up: At [facility], you and your health needs are our priority.  As part of our continuing mission to provide you with exceptional heart care, we have created designated Provider Care Teams.  These Care Teams include your primary Cardiologist (physician) and Advanced Practice Providers (APPs -  Physician Assistants and Nurse Practitioners) who all work together to provide you with the care you need, when you need it.  We recommend signing up for the patient portal called "MyChart".  Sign up information is provided on this After Visit Summary.  MyChart is used to connect with patients for Virtual Visits (Telemedicine).  Patients are able to view lab/test results, encounter notes, upcoming appointments, etc.  Non-urgent messages can be sent to your provider as well.   To learn more about what you can do with MyChart, go to [email or web address].    Your next appointment:   12 month(s)  The format for your next appointment:   In Person  Provider:   Jonathan Berry, MD     

## 2020-01-30 NOTE — Progress Notes (Signed)
01/30/2020 Eric [name]   1936/06/03  [id number]  Primary Physician Shon Baton, MD Primary Cardiologist: Lorretta Harp MD Lupe Carney, [place]  HPI:  Eric [name] is a 84 y.o.  mildly to moderately overweight married Caucasian male father of 79, grandfather to 8 grandchildren who is accompanied by his wife Eric [name] today as usual. I last saw him in the office  04/18/2019.Marland Kitchen He has a history of CAD and PVOD. I stented his LAD and diagonal branch back on July 14, 2003 and subsequent stenting by Dr. Melvern Banker July 31, 2003. He had mild bilateral renal artery stenosis at cath with blockages in the 40% range as well as moderate right internal carotid artery stenosis by duplex ultrasound. He was neurologic asymptomatic. His other problems include hypertension and hyperlipidemia. He has obstructive sleep apnea followed by Dr. Claiborne Billings. His most recent lab work revealed a total cholesterol of 125, LDL of 58 and HDL of 36. Carotid Dopplers performed in September 2016 revealed moderate right ICA stenosis, unchanged from a prior study and renal Dopplers performed 01/05/16 showed progression of bilateral renal artery stenosis. CT angiography performed 02/04/16 that showed high-grade ostial left renal artery stenosis with probable right renal artery stenosis as well. The patient underwent left renal artery PTA and stenting using a 6 mm x 15 mm long balloon expandable stent with excellent angiographic result. His follow-up Dopplers performed on1/15/18showed marked improvement in his velocities and renal aortic ratios.Since I saw me regarding his remaining currently stable. He does have moderate aortic stenosis by 2-D echo performed1/14/2019the valve area 1 cm and a peak gradient of 47mmHg.   Recent noninvasive tests show stable right ICA stenosis, patent left renal artery stent with moderate right renal artery stenosis and non-compressible vessels not allowing the determination of ABIs. He does  complain of pain in his hips and legs which does not sound like claudication. It may be statin related and we talked about starting a statin holiday for 3 months.  Since stopping his high-dose statin several months ago his lower extremity pain and hip pain has significantly improved suggesting this was a statin intolerance.  As result, we did start Praluent which has improved his lipid profile..  2D echo performed 10/05/2017 revealed normal LV function with a valve area of 1.1 cm and a peak gradient of 31 mmHg although the patient is asymptomatic from this.  Since I saw him a year ago he continues to do well.  He is recovering from a sinus infection.  He denies chest pain or shortness of breath.   Current Meds  Medication Sig  . Alirocumab (PRALUENT) 150 MG/ML SOAJ Inject 150 mg into the skin every 14 (fourteen) days.  Marland Kitchen aspirin 81 MG tablet Take 81 mg by mouth daily.    . cefdinir (OMNICEF) 300 MG capsule   . clopidogrel (PLAVIX) 75 MG tablet TAKE 1 TABLET BY MOUTH EVERY DAY  . diazepam (VALIUM) 5 MG tablet TAKE 1 TABLET EVERY 8 HOURS AS NEEDED  . ezetimibe (ZETIA) 10 MG tablet TAKE 1 TABLET BY MOUTH DAILY  . fluticasone (FLONASE) 50 MCG/ACT nasal spray Place 2 sprays into both nostrils daily.  . furosemide (LASIX) 20 MG tablet Take 20 mg by mouth as needed (AS NEEDED FOR SWELLING).  Marland Kitchen losartan (COZAAR) 100 MG tablet Take 100 mg by mouth daily.  . meclizine (ANTIVERT) 25 MG tablet TAKE ONE TABLET BY MOUTH EVERY 8 HOURS AS NEEDED FOR DIZZINESS  . metoprolol tartrate (LOPRESSOR) 50  MG tablet TAKE 1.5 TABLETS BY MOUTH TWICE DAILY  . nitroGLYCERIN (NITROSTAT) 0.4 MG SL tablet Place 1 tablet (0.4 mg total) under the tongue every 5 (five) minutes as needed for chest pain.  . pantoprazole (PROTONIX) 40 MG tablet Take 1 tablet (40 mg total) by mouth daily.  . predniSONE (DELTASONE) 20 MG tablet Take 20 mg by mouth daily.     Allergies  Allergen Reactions  . Niaspan [Niacin Er]   . Sertraline  Hcl     REACTION: disorientation  . Tetanus Toxoids     Social History   Socioeconomic History  . Marital status: Married    Spouse name: Eric [name]  . Number of children: 4  . Years of education: Not on file  . Highest education level: Not on file  Occupational History  . Occupation: Retired  Tobacco Use  . Smoking status: Former Smoker    Packs/day: 1.00    Years: 15.00    Pack years: 15.00    Types: Cigarettes    Quit date: 10/02/1964    Years since quitting: 55.3  . Smokeless tobacco: Never Used  Substance and Sexual Activity  . Alcohol use: Yes    Comment: 03/20/2016 "haven't had a drink since the 1990s"  . Drug use: No  . Sexual activity: Not Currently  Other Topics Concern  . Not on file  Social History Narrative  . Not on file   Social Determinants of Health   Financial Resource Strain:   . Difficulty of Paying Living Expenses:   Food Insecurity:   . Worried About Charity fundraiser in the Last Year:   . Arboriculturist in the Last Year:   Transportation Needs:   . Film/video editor (Medical):   Marland Kitchen Lack of Transportation (Non-Medical):   Physical Activity:   . Days of Exercise per Week:   . Minutes of Exercise per Session:   Stress:   . Feeling of Stress :   Social Connections:   . Frequency of Communication with Friends and Family:   . Frequency of Social Gatherings with Friends and Family:   . Attends Religious Services:   . Active Member of Clubs or Organizations:   . Attends Archivist Meetings:   Marland Kitchen Marital Status:   Intimate Partner Violence:   . Fear of Current or Ex-Partner:   . Emotionally Abused:   Marland Kitchen Physically Abused:   . Sexually Abused:      Review of Systems: General: negative for chills, fever, night sweats or weight changes.  Cardiovascular: negative for chest pain, dyspnea on exertion, edema, orthopnea, palpitations, paroxysmal nocturnal dyspnea or shortness of breath Dermatological: negative for rash Respiratory:  negative for cough or wheezing Urologic: negative for hematuria Abdominal: negative for nausea, vomiting, diarrhea, bright red blood per rectum, melena, or hematemesis Neurologic: negative for visual changes, syncope, or dizziness All other systems reviewed and are otherwise negative except as noted above.    Blood pressure 130/62, pulse (!) 51, height 5\' 8"  (1.727 m), weight 185 lb 3.2 oz (84 kg), SpO2 97 %.  General appearance: alert and no distress Neck: no adenopathy, no carotid bruit, no JVD, supple, symmetrical, trachea midline and thyroid not enlarged, symmetric, no tenderness/mass/nodules Lungs: clear to auscultation bilaterally Heart: 2/6 systolic ejection murmur at the base consistent with aortic stenosis Extremities: extremities normal, atraumatic, no cyanosis or edema Pulses: 2+ and symmetric Skin: Skin color, texture, turgor normal. No rashes or lesions Neurologic: Alert and oriented X 3,  normal strength and tone. Normal symmetric reflexes. Normal coordination and gait  EKG sinus bradycardia 51 without ST or T wave changes.  I personally reviewed this EKG.  ASSESSMENT AND PLAN:   Hyperlipidemia History of hyperlipidemia on Praluent and Zetia with lipid profile performed 01/01/2020 revealing total cholesterol 118, LDL of 54 and HDL 37.  Essential hypertension History of essential hypertension blood pressure measured today at 130/62.  He is on metoprolol and losartan.  Coronary atherosclerosis History of CAD status post stenting of the LAD and diagonal branch by myself July 14, 2003 with subsequent stenting by Dr. Melvern Banker July 31, 2003.  He denies chest pain or shortness of breath.  Peripheral vascular disease ([place]) History of peripheral arterial disease status post left renal artery PTA and stenting by myself back in 2017 with follow-up Dopplers performed 12/01/2019 revealing a patent left renal artery stent with moderate right renal artery stenosis and a right renal  aortic ratio of 5.26 with preserved renal dimensions.  We will repeat renal Doppler studies in 1 year.  OSA (obstructive sleep apnea) History of obstructive sleep apnea on CPAP followed by Dr. Claiborne Billings  Aortic stenosis, moderate History of moderate aortic stenosis by 2D echo 10/05/2017 with preserved EF.  We will repeat his 2D echocardiogram.      Lorretta Harp MD [name], [facility] 01/30/2020 11:08 AM

## 2020-01-30 NOTE — Assessment & Plan Note (Signed)
History of peripheral arterial disease status post left renal artery PTA and stenting by myself back in 2017 with follow-up Dopplers performed 12/01/2019 revealing a patent left renal artery stent with moderate right renal artery stenosis and a right renal aortic ratio of 5.26 with preserved renal dimensions.  We will repeat renal Doppler studies in 1 year.

## 2020-01-30 NOTE — Assessment & Plan Note (Signed)
History of CAD status post stenting of the LAD and diagonal branch by myself July 14, 2003 with subsequent stenting by Dr. Melvern Banker July 31, 2003.  He denies chest pain or shortness of breath.

## 2020-02-02 DIAGNOSIS — Z85828 Personal history of other malignant neoplasm of skin: Secondary | ICD-10-CM | POA: Diagnosis not present

## 2020-02-02 DIAGNOSIS — L570 Actinic keratosis: Secondary | ICD-10-CM | POA: Diagnosis not present

## 2020-02-02 DIAGNOSIS — D0439 Carcinoma in situ of skin of other parts of face: Secondary | ICD-10-CM | POA: Diagnosis not present

## 2020-02-02 DIAGNOSIS — D485 Neoplasm of uncertain behavior of skin: Secondary | ICD-10-CM | POA: Diagnosis not present

## 2020-02-05 ENCOUNTER — Other Ambulatory Visit (HOSPITAL_COMMUNITY): Payer: Self-pay | Admitting: Cardiovascular Disease

## 2020-02-05 DIAGNOSIS — I739 Peripheral vascular disease, unspecified: Secondary | ICD-10-CM

## 2020-02-20 ENCOUNTER — Other Ambulatory Visit: Payer: Self-pay

## 2020-02-20 ENCOUNTER — Other Ambulatory Visit (HOSPITAL_COMMUNITY): Payer: Medicare Other

## 2020-02-20 ENCOUNTER — Ambulatory Visit (HOSPITAL_COMMUNITY): Payer: Medicare Other | Attending: Cardiology

## 2020-02-20 DIAGNOSIS — I350 Nonrheumatic aortic (valve) stenosis: Secondary | ICD-10-CM | POA: Insufficient documentation

## 2020-02-25 ENCOUNTER — Telehealth: Payer: Self-pay | Admitting: Cardiovascular Disease

## 2020-02-25 DIAGNOSIS — I350 Nonrheumatic aortic (valve) stenosis: Secondary | ICD-10-CM

## 2020-02-25 NOTE — Telephone Encounter (Signed)
Lorretta Harp, MD  02/21/2020 9:03 AM EDT    Nl LV systolic FXN. G2DD. Mod AS, peak grad mildly increased and AoV area mildly decreased. Repeat 1 59months   Spoke with the pt and informed him of echo results and recommendations per Dr. Gwenlyn Found.  Informed the pt that per Dr. Gwenlyn Found, he would like for him to have a repeat echo done in 12 months, to reassess AoV area mildly decreased.  Informed the pt that I will place the order for the echo in the system, and send our Echo Scheduler a message to call the pt back and arrange this appt.  Pt verbalized understanding and agrees with this plan.

## 2020-02-25 NOTE — Telephone Encounter (Signed)
Patient's wife calling back for echo results.

## 2020-02-25 NOTE — Telephone Encounter (Signed)
  Wife is calling for echo results

## 2020-03-02 ENCOUNTER — Telehealth: Payer: Self-pay | Admitting: Pharmacist

## 2020-03-02 NOTE — Telephone Encounter (Signed)
RETURNED CALL to pt and they wanted to know when the [employer] needs to be renewed and that isn't due until 6/28 and to call them then. Pt voiced understanding

## 2020-03-02 NOTE — Telephone Encounter (Signed)
Patient returning call to [facility]. Please call him back when possible.

## 2020-03-29 MED ORDER — PRALUENT 150 MG/ML ~~LOC~~ SOAJ: 150.0000 mg | SUBCUTANEOUS | 12 refills | Status: DC

## 2020-03-29 NOTE — Addendum Note (Signed)
Addended by: Allean Found on: 03/29/2020 09:31 AM   Modules accepted: Orders

## 2020-03-29 NOTE — Telephone Encounter (Signed)
Called the pt and they stated that they were approved for the [employer] refill sent

## 2020-03-29 NOTE — Telephone Encounter (Signed)
LMOMED THE PT TO CALL BACK

## 2020-05-05 DIAGNOSIS — K219 Gastro-esophageal reflux disease without esophagitis: Secondary | ICD-10-CM | POA: Diagnosis not present

## 2020-05-05 DIAGNOSIS — I129 Hypertensive chronic kidney disease with stage 1 through stage 4 chronic kidney disease, or unspecified chronic kidney disease: Secondary | ICD-10-CM | POA: Diagnosis not present

## 2020-05-05 DIAGNOSIS — G4733 Obstructive sleep apnea (adult) (pediatric): Secondary | ICD-10-CM | POA: Diagnosis not present

## 2020-05-05 DIAGNOSIS — N1831 Chronic kidney disease, stage 3a: Secondary | ICD-10-CM | POA: Diagnosis not present

## 2020-05-05 DIAGNOSIS — R05 Cough: Secondary | ICD-10-CM | POA: Diagnosis not present

## 2020-05-05 DIAGNOSIS — J329 Chronic sinusitis, unspecified: Secondary | ICD-10-CM | POA: Diagnosis not present

## 2020-05-11 DIAGNOSIS — L570 Actinic keratosis: Secondary | ICD-10-CM | POA: Diagnosis not present

## 2020-05-11 DIAGNOSIS — Z85828 Personal history of other malignant neoplasm of skin: Secondary | ICD-10-CM | POA: Diagnosis not present

## 2020-06-29 ENCOUNTER — Other Ambulatory Visit: Payer: Self-pay | Admitting: Cardiovascular Disease

## 2020-06-29 MED ORDER — CLOPIDOGREL BISULFATE 75 MG PO TABS: 75.0000 mg | ORAL_TABLET | Freq: Every day | ORAL | 3 refills | Status: DC

## 2020-06-29 NOTE — Telephone Encounter (Signed)
*  STAT* If patient is at the pharmacy, call can be transferred to refill team.   1. Which medications need to be refilled? (please list name of each medication and dose if known)  clopidogrel (PLAVIX) 75 MG tablet  2. Which pharmacy/location (including street and city if local pharmacy) is medication to be sent to? [id number] - [place], [place] - [address] AT [address]  3. Do they need a 30 day or 90 day supply? 90 day supply

## 2020-07-07 DIAGNOSIS — Z23 Encounter for immunization: Secondary | ICD-10-CM | POA: Diagnosis not present

## 2020-11-19 ENCOUNTER — Telehealth: Payer: Self-pay | Admitting: Cardiovascular Disease

## 2020-11-19 NOTE — Telephone Encounter (Signed)
Pt c/o medication issue:  1. Name of Medication: Alirocumab (PRALUENT) 150 MG/ML SOAJ  2. How are you currently taking this medication (dosage and times per day)? One injection every 2 weeks  3. Are you having a reaction (difficulty breathing--STAT)?   4. What is your medication issue? Patient wanted to know when he needs to re-enroll in the financial assistance program for this medication. Please advise

## 2020-11-19 NOTE — Telephone Encounter (Signed)
Will send over to pharmd to advise.

## 2020-11-22 NOTE — Telephone Encounter (Signed)
Called and spoke w/pt and stated that they are approved until June 28th and to give me a call then and he voiced understanding

## 2020-11-24 ENCOUNTER — Encounter (HOSPITAL_COMMUNITY): Payer: Medicare Other

## 2020-11-29 ENCOUNTER — Ambulatory Visit (HOSPITAL_COMMUNITY)
Admission: RE | Admit: 2020-11-29 | Discharge: 2020-11-29 | Disposition: A | Discharge status: Home / Self Care | Payer: Medicare Other | Source: Ambulatory Visit | Attending: Internal Medicine | Admitting: Internal Medicine

## 2020-11-29 ENCOUNTER — Other Ambulatory Visit: Payer: Self-pay

## 2020-11-29 DIAGNOSIS — I701 Atherosclerosis of renal artery: Secondary | ICD-10-CM

## 2020-12-02 ENCOUNTER — Telehealth: Payer: Self-pay | Admitting: Cardiovascular Disease

## 2020-12-02 NOTE — Telephone Encounter (Signed)
Spoke with pt, aware dr berry has not reviewed the results but we will be back in touch once he reviews the results.

## 2020-12-02 NOTE — Telephone Encounter (Signed)
  Pt wife called in and would like to see if someone could call them back today with results of the  VAS US RENAL ARTERY DUPLEX   [id number] Cell number - [phone]

## 2020-12-07 NOTE — Telephone Encounter (Signed)
Per Dr Fredric Mare increased suggesting progression of disease although renal dimensions stable and BP well controlled. Repeat 12 months

## 2020-12-07 NOTE — Telephone Encounter (Signed)
Wife called in again and would like to know the results of pts tests.  She stated "she has not heard from anyone and would like to know what is going on"   Best number [phone]

## 2020-12-07 NOTE — Telephone Encounter (Signed)
Pt's wife aware of renal ultrasound results ./cy

## 2020-12-28 DIAGNOSIS — M79671 Pain in right foot: Secondary | ICD-10-CM | POA: Diagnosis not present

## 2021-01-12 DIAGNOSIS — L84 Corns and callosities: Secondary | ICD-10-CM | POA: Diagnosis not present

## 2021-01-12 DIAGNOSIS — M79671 Pain in right foot: Secondary | ICD-10-CM | POA: Diagnosis not present

## 2021-01-17 ENCOUNTER — Other Ambulatory Visit (HOSPITAL_COMMUNITY): Payer: Self-pay | Admitting: Cardiovascular Disease

## 2021-01-17 DIAGNOSIS — I739 Peripheral vascular disease, unspecified: Secondary | ICD-10-CM

## 2021-01-24 ENCOUNTER — Ambulatory Visit (HOSPITAL_BASED_OUTPATIENT_CLINIC_OR_DEPARTMENT_OTHER)
Admission: RE | Admit: 2021-01-24 | Discharge: 2021-01-24 | Disposition: A | Discharge status: Home / Self Care | Payer: Medicare Other | Source: Ambulatory Visit | Attending: Cardiovascular Disease | Admitting: Cardiovascular Disease

## 2021-01-24 ENCOUNTER — Ambulatory Visit (HOSPITAL_COMMUNITY)
Admission: RE | Admit: 2021-01-24 | Discharge: 2021-01-24 | Disposition: A | Discharge status: Home / Self Care | Payer: Medicare Other | Source: Ambulatory Visit | Attending: Cardiology | Admitting: Cardiology

## 2021-01-24 ENCOUNTER — Ambulatory Visit (HOSPITAL_BASED_OUTPATIENT_CLINIC_OR_DEPARTMENT_OTHER): Payer: Medicare Other

## 2021-01-24 ENCOUNTER — Other Ambulatory Visit: Payer: Self-pay

## 2021-01-24 ENCOUNTER — Telehealth: Payer: Self-pay | Admitting: Cardiovascular Disease

## 2021-01-24 DIAGNOSIS — I6523 Occlusion and stenosis of bilateral carotid arteries: Secondary | ICD-10-CM

## 2021-01-24 DIAGNOSIS — I739 Peripheral vascular disease, unspecified: Secondary | ICD-10-CM

## 2021-01-24 DIAGNOSIS — I350 Nonrheumatic aortic (valve) stenosis: Secondary | ICD-10-CM

## 2021-01-24 DIAGNOSIS — I779 Disorder of arteries and arterioles, unspecified: Secondary | ICD-10-CM | POA: Insufficient documentation

## 2021-01-24 LAB — ECHOCARDIOGRAM COMPLETE
AR max vel: 1.09 cm2
AV Area VTI: 1.1 cm2
AV Area mean vel: 0.97 cm2
AV Mean grad: 22.5 mmHg
AV Peak grad: 37.8 mmHg
Ao pk vel: 3.07 m/s
Area-P 1/2: 1.87 cm2
MV VTI: 1.4 cm2
P 1/2 time: 464 msec
S' Lateral: 2.9 cm

## 2021-01-24 NOTE — Telephone Encounter (Signed)
Spoke with patient's wife about vascular testing. She said patient has aorta vascular scan yearly - explained this was last done July 2020 and was "essentially normal, repeat when clinically indicated". She is adamant that he had it done last year, and is asking why it was not done this year. Explained again the test results and what vascular studies are done on an annual basis. He will discuss with Dr. Gwenlyn Found if he has further questions.

## 2021-01-24 NOTE — Telephone Encounter (Signed)
PT's wife is calling with concerns in regards to the testing that was done on her husband.She states that he arotid check today and wants to know why when he always has it done>please advise

## 2021-01-28 ENCOUNTER — Other Ambulatory Visit: Payer: Self-pay

## 2021-01-28 ENCOUNTER — Ambulatory Visit (INDEPENDENT_AMBULATORY_CARE_PROVIDER_SITE_OTHER): Payer: Medicare Other | Admitting: Cardiovascular Disease

## 2021-01-28 ENCOUNTER — Encounter: Payer: Self-pay | Admitting: Cardiovascular Disease

## 2021-01-28 DIAGNOSIS — I701 Atherosclerosis of renal artery: Secondary | ICD-10-CM | POA: Diagnosis not present

## 2021-01-28 DIAGNOSIS — E782 Mixed hyperlipidemia: Secondary | ICD-10-CM

## 2021-01-28 DIAGNOSIS — I10 Essential (primary) hypertension: Secondary | ICD-10-CM

## 2021-01-28 DIAGNOSIS — I350 Nonrheumatic aortic (valve) stenosis: Secondary | ICD-10-CM | POA: Diagnosis not present

## 2021-01-28 DIAGNOSIS — I739 Peripheral vascular disease, unspecified: Secondary | ICD-10-CM

## 2021-01-28 DIAGNOSIS — I2510 Atherosclerotic heart disease of native coronary artery without angina pectoris: Secondary | ICD-10-CM

## 2021-01-28 NOTE — Assessment & Plan Note (Addendum)
History of PAD with recent Doppler studies performed 01/04/2021 revealing noncompressible ABIs with high-grade right popliteal artery stenosis although the patient really denies claudication.

## 2021-01-28 NOTE — Assessment & Plan Note (Signed)
History of CAD status post LAD and diagonal branch stenting by myself 07/14/2003 with subsequent stenting by Dr. Melvern Banker 07/31/2003.  He has not had any cardiac catheterization since.  He denies chest pain or shortness of breath.

## 2021-01-28 NOTE — Assessment & Plan Note (Signed)
History of essential hypertension a blood pressure measured today 126/74.  He is on losartan and metoprolol.

## 2021-01-28 NOTE — Assessment & Plan Note (Signed)
History of peripheral arterial disease status post left renal artery PTA and stent by myself 03/20/2016 with a 6 mm x 15 mm long balloon expandable stent.  He did have 40 to 50% right renal artery stenosis.  His most recent renal Dopplers performed 12/01/2020 suggest that his renal stent is patent.  He has had mild progression of his right renal artery stenosis.

## 2021-01-28 NOTE — Assessment & Plan Note (Signed)
History of hyperlipidemia on Zetia and Praluent with lipid profile performed 01/01/2020 revealing total cholesterol 118, LDL 54 and HDL 37.

## 2021-01-28 NOTE — Assessment & Plan Note (Signed)
History of essentially normal coronary arteries by recent Doppler study.

## 2021-01-28 NOTE — Assessment & Plan Note (Signed)
History of moderate aortic stenosis by 2D echo recently performed 01/24/2021 with a valve area of 1.1 cm and a peak gradient of 38 mmHg.  He is relatively asymptomatic.  I will repeat this in 1 year.

## 2021-01-28 NOTE — Progress Notes (Signed)
01/28/2021 [name]   [birth year]  [id number]  Primary Physician Shon Baton, MD Primary Cardiologist: Lorretta Harp MD Eric [name], [place]  HPI:  Eric [name] is a 85 y.o.  mildly to moderately overweight married Caucasian male father of 64, grandfather to 8 grandchildren who is accompanied by his wife Eric [name] today as usual. I last saw him in the office 01/30/2020.Marland Kitchen He has a history of CAD and PVOD. I stented his LAD and diagonal branch back on July 14, 2003 and subsequent stenting by Dr. Melvern Banker July 31, 2003. He had mild bilateral renal artery stenosis at cath with blockages in the 40% range as well as moderate right internal carotid artery stenosis by duplex ultrasound. He was neurologic asymptomatic. His other problems include hypertension and hyperlipidemia. He has obstructive sleep apnea followed by Dr. Claiborne Billings. His most recent lab work revealed a total cholesterol of 125, LDL of 58 and HDL of 36. Carotid Dopplers performed in September 2016 revealed moderate right ICA stenosis, unchanged from a prior study and renal Dopplers performed 01/05/16 showed progression of bilateral renal artery stenosis. CT angiography performed 02/04/16 that showed high-grade ostial left renal artery stenosis with probable right renal artery stenosis as well. The patient underwent left renal artery PTA and stenting using a 6 mm x 15 mm long balloon expandable stent with excellent angiographic result. His follow-up Dopplers performed on1/15/18showed marked improvement in his velocities and renal aortic ratios.Since I saw me regarding his remaining currently stable. He does have moderate aortic stenosis by 2-D echo performed1/14/2019the valve area 1 cm and a peak gradient of 82mmHg.   Recent noninvasive tests show stable right ICA stenosis, patent left renal artery stent with moderate right renal artery stenosis and non-compressible vessels not allowing the determination of ABIs. He does complain  of pain in his hips and legs which does not sound like claudication. It may be statin related and we talked about starting a statin holiday for 3 months.  Since stopping his high-dose statin several months ago his lower extremity pain and hip pain has significantly improved suggesting this was a statin intolerance.  As result, we did start Praluent which has improved his lipid profile.. 2D echo performed 10/05/2017 revealed normal LV function with a valve area of 1.1 cm and a peak gradient of 31 mmHg although the patient is asymptomatic from this.  Since I saw him a year ago he continues to do well.  He denies chest pain, shortness of breath or claudication.   Current Meds  Medication Sig  . Alirocumab (PRALUENT) 150 MG/ML SOAJ Inject 150 mg into the skin every 14 (fourteen) days.  Marland Kitchen aspirin 81 MG tablet Take 81 mg by mouth daily.  . clopidogrel (PLAVIX) 75 MG tablet Take 1 tablet (75 mg total) by mouth daily.  . diazepam (VALIUM) 5 MG tablet TAKE 1 TABLET EVERY 8 HOURS AS NEEDED  . ezetimibe (ZETIA) 10 MG tablet TAKE 1 TABLET BY MOUTH DAILY  . fluticasone (FLONASE) 50 MCG/ACT nasal spray Place 2 sprays into both nostrils daily.  . furosemide (LASIX) 20 MG tablet Take 20 mg by mouth as needed (AS NEEDED FOR SWELLING).  Marland Kitchen losartan (COZAAR) 100 MG tablet Take 100 mg by mouth daily.  . meclizine (ANTIVERT) 25 MG tablet TAKE ONE TABLET BY MOUTH EVERY 8 HOURS AS NEEDED FOR DIZZINESS  . metoprolol tartrate (LOPRESSOR) 50 MG tablet TAKE 1.5 TABLETS BY MOUTH TWICE DAILY  . nitroGLYCERIN (NITROSTAT) 0.4 MG SL tablet  Place 1 tablet (0.4 mg total) under the tongue every 5 (five) minutes as needed for chest pain.  . pantoprazole (PROTONIX) 40 MG tablet Take 1 tablet (40 mg total) by mouth daily.  . predniSONE (DELTASONE) 20 MG tablet Take 20 mg by mouth daily.  . [DISCONTINUED] cefdinir (OMNICEF) 300 MG capsule      Allergies  Allergen Reactions  . Niaspan [Niacin Er]   . Sertraline Hcl      REACTION: disorientation  . Tetanus Toxoids     Social History   Socioeconomic History  . Marital status: Married    Spouse name: ruth  . Number of children: 4  . Years of education: Not on file  . Highest education level: Not on file  Occupational History  . Occupation: Retired  Tobacco Use  . Smoking status: Former Smoker    Packs/day: 1.00    Years: 15.00    Pack years: 15.00    Types: Cigarettes    Quit date: 10/02/1964    Years since quitting: 56.3  . Smokeless tobacco: Never Used  Substance and Sexual Activity  . Alcohol use: Yes    Comment: 03/20/2016 "haven't had a drink since the 1990s"  . Drug use: No  . Sexual activity: Not Currently  Other Topics Concern  . Not on file  Social History Narrative  . Not on file   Social Determinants of Health   Financial Resource Strain: Not on file  Food Insecurity: Not on file  Transportation Needs: Not on file  Physical Activity: Not on file  Stress: Not on file  Social Connections: Not on file  Intimate Partner Violence: Not on file     Review of Systems: General: negative for chills, fever, night sweats or weight changes.  Cardiovascular: negative for chest pain, dyspnea on exertion, edema, orthopnea, palpitations, paroxysmal nocturnal dyspnea or shortness of breath Dermatological: negative for rash Respiratory: negative for cough or wheezing Urologic: negative for hematuria Abdominal: negative for nausea, vomiting, diarrhea, bright red blood per rectum, melena, or hematemesis Neurologic: negative for visual changes, syncope, or dizziness All other systems reviewed and are otherwise negative except as noted above.    Blood pressure 126/74, pulse (!) 52, height 5\' 8"  (1.727 m), weight 196 lb 12.8 oz (89.3 kg), SpO2 98 %.  General appearance: alert and no distress Neck: no adenopathy, no JVD, supple, symmetrical, trachea midline, thyroid not enlarged, symmetric, no tenderness/mass/nodules and Soft left carotid  bruit Lungs: clear to auscultation bilaterally Heart: 2/6 systolic ejection murmur at the base consistent with aortic stenosis. Extremities: extremities normal, atraumatic, no cyanosis or edema Pulses: 2+ and symmetric Skin: Skin color, texture, turgor normal. No rashes or lesions Neurologic: Alert and oriented X 3, normal strength and tone. Normal symmetric reflexes. Normal coordination and gait  EKG sinus bradycardia 52 without ST or T wave changes.  I personally reviewed this EKG.  ASSESSMENT AND PLAN:   Hyperlipidemia History of hyperlipidemia on Zetia and Praluent with lipid profile performed 01/01/2020 revealing total cholesterol 118, LDL 54 and HDL 37.  Essential hypertension History of essential hypertension a blood pressure measured today 126/74.  He is on losartan and metoprolol.  Coronary atherosclerosis History of CAD status post LAD and diagonal branch stenting by myself 07/14/2003 with subsequent stenting by Dr. Melvern Banker 07/31/2003.  He has not had any cardiac catheterization since.  He denies chest pain or shortness of breath.  Peripheral vascular disease ([place]) History of PAD with recent Doppler studies performed 01/04/2021 revealing noncompressible ABIs  with high-grade right popliteal artery stenosis although the patient really denies claudication.  Carotid artery disease ([place]) History of essentially normal coronary arteries by recent Doppler study.  Left renal artery stenosis ([facility]) History of peripheral arterial disease status post left renal artery PTA and stent by myself 03/20/2016 with a 6 mm x 15 mm long balloon expandable stent.  He did have 40 to 50% right renal artery stenosis.  His most recent renal Dopplers performed 12/01/2020 suggest that his renal stent is patent.  He has had mild progression of his right renal artery stenosis.  Aortic stenosis, moderate History of moderate aortic stenosis by 2D echo recently performed 01/24/2021 with a valve area of 1.1 cm and a  peak gradient of 38 mmHg.  He is relatively asymptomatic.  I will repeat this in 1 year.      Lorretta Harp MD FACP,FACC,FAHA, [facility] 01/28/2021 2:07 PM

## 2021-01-28 NOTE — Patient Instructions (Signed)
Medication Instructions:  Your physician recommends that you continue on your current medications as directed. Please refer to the Current Medication list given to you today.  *If you need a refill on your cardiac medications before your next appointment, please call your pharmacy*  Testing/Procedures: Your physician has requested that you have an echocardiogram. Echocardiography is a painless test that uses sound waves to create images of your heart. It provides your doctor with information about the size and shape of your heart and how well your heart's chambers and valves are working. This procedure takes approximately one hour. There are no restrictions for this procedure. This procedure is done at [address] 3rd [address]. To be done in April 2023   Your physician has requested that you have a lower extremity arterial duplex. This test is an ultrasound of the arteries in the legs. It looks at arterial blood flow in the legs. Allow one hour for Lower Arterial scans. There are no restrictions or special instructions  Your physician has requested that you have a renal artery duplex. During this test, an ultrasound is used to evaluate blood flow to the kidneys. Allow one hour for this exam. Do not eat after midnight the day before and avoid carbonated beverages. Take your medications as you usually do.  Your physician has requested that you have an ankle brachial index (ABI). During this test an ultrasound and blood pressure cuff are used to evaluate the arteries that supply the arms and legs with blood. Allow thirty minutes for this exam. There are no restrictions or special instructions.  Your physician has requested that you have a carotid duplex. This test is an ultrasound of the carotid arteries in your neck. It looks at blood flow through these arteries that supply the brain with blood. Allow one hour for this exam. There are no restrictions or special instructions.  These procedures are  done at [place]. 2nd Floor. To be done in April 2023.   Follow-Up: At [facility], you and your health needs are our priority.  As part of our continuing mission to provide you with exceptional heart care, we have created designated Provider Care Teams.  These Care Teams include your primary Cardiologist (physician) and Advanced Practice Providers (APPs -  Physician Assistants and Nurse Practitioners) who all work together to provide you with the care you need, when you need it.  We recommend signing up for the patient portal called "MyChart".  Sign up information is provided on this After Visit Summary.  MyChart is used to connect with patients for Virtual Visits (Telemedicine).  Patients are able to view lab/test results, encounter notes, upcoming appointments, etc.  Non-urgent messages can be sent to your provider as well.   To learn more about what you can do with MyChart, go to [email or web address].    Your next appointment:   12 month(s)  The format for your next appointment:   In Person  Provider:   Quay Burow, MD

## 2021-02-15 ENCOUNTER — Telehealth: Payer: Self-pay | Admitting: Cardiovascular Disease

## 2021-02-15 NOTE — Telephone Encounter (Signed)
lmom pt that they are good till 03/29/21 and that he still has an available balance of$521 and to call me back on that date

## 2021-02-15 NOTE — Telephone Encounter (Signed)
Sent to pharmacy [facility]

## 2021-02-15 NOTE — Telephone Encounter (Signed)
   Pt c/o medication issue:  1. Name of Medication:   Alirocumab (PRALUENT) 150 MG/ML SOAJ    2. How are you currently taking this medication (dosage and times per day)? Inject 150 mg into the skin every 14 (fourteen) days.  3. Are you having a reaction (difficulty breathing--STAT)?   4. What is your medication issue? Pt would like to know when and how he can renew his pt assistance with health well for his Praluent. Pt said to call his cell#

## 2021-04-29 DIAGNOSIS — E785 Hyperlipidemia, unspecified: Secondary | ICD-10-CM | POA: Diagnosis not present

## 2021-04-29 DIAGNOSIS — E1122 Type 2 diabetes mellitus with diabetic chronic kidney disease: Secondary | ICD-10-CM | POA: Diagnosis not present

## 2021-04-29 DIAGNOSIS — Z125 Encounter for screening for malignant neoplasm of prostate: Secondary | ICD-10-CM | POA: Diagnosis not present

## 2021-05-02 ENCOUNTER — Telehealth: Payer: Self-pay

## 2021-05-02 DIAGNOSIS — E782 Mixed hyperlipidemia: Secondary | ICD-10-CM

## 2021-05-02 NOTE — Telephone Encounter (Signed)
Letter mailed to pt        Mr. Volner, In order to receive patient assistance for your injectable cholesterol medication Praluent, [facility] needs you to complete the highlighted portions in orange of this form as soon as possible. Please complete them and return them back to [place] [address] Attention to Pharmacist. We have also included a form about a way to call and see if you could qualify for extra help the number to call is circled in green. We also need you to complete fasting labs so I recommend bringing the form back the morning you complete those labs and no appointment is needed.  Thanks, Thor [phone]

## 2021-05-02 NOTE — Telephone Encounter (Signed)
Sent information on LIS as well as application for PASS (Praluent patient assistance)

## 2021-05-02 NOTE — Addendum Note (Signed)
Addended by: Allean Found on: 05/02/2021 09:20 AM   Modules accepted: Orders

## 2021-05-02 NOTE — Telephone Encounter (Signed)
Pt called in and stated that they cannot afford pcsk9 injections. They last filled it for 3 months and that they have 2 more shots left. I advised the pt to make the shots last 3 weeks but they request a callback from a pharmd to discuss other options. Will route to pharmd pool for assistance

## 2021-05-06 DIAGNOSIS — I129 Hypertensive chronic kidney disease with stage 1 through stage 4 chronic kidney disease, or unspecified chronic kidney disease: Secondary | ICD-10-CM | POA: Diagnosis not present

## 2021-05-06 DIAGNOSIS — I739 Peripheral vascular disease, unspecified: Secondary | ICD-10-CM | POA: Diagnosis not present

## 2021-05-06 DIAGNOSIS — I701 Atherosclerosis of renal artery: Secondary | ICD-10-CM | POA: Diagnosis not present

## 2021-05-06 DIAGNOSIS — I2510 Atherosclerotic heart disease of native coronary artery without angina pectoris: Secondary | ICD-10-CM | POA: Diagnosis not present

## 2021-05-06 DIAGNOSIS — E1122 Type 2 diabetes mellitus with diabetic chronic kidney disease: Secondary | ICD-10-CM | POA: Diagnosis not present

## 2021-05-06 DIAGNOSIS — E785 Hyperlipidemia, unspecified: Secondary | ICD-10-CM | POA: Diagnosis not present

## 2021-05-06 DIAGNOSIS — R82998 Other abnormal findings in urine: Secondary | ICD-10-CM | POA: Diagnosis not present

## 2021-05-06 DIAGNOSIS — N1831 Chronic kidney disease, stage 3a: Secondary | ICD-10-CM | POA: Diagnosis not present

## 2021-05-06 DIAGNOSIS — I252 Old myocardial infarction: Secondary | ICD-10-CM | POA: Diagnosis not present

## 2021-05-06 DIAGNOSIS — Z0000 Encounter for general adult medical examination without abnormal findings: Secondary | ICD-10-CM | POA: Diagnosis not present

## 2021-05-06 DIAGNOSIS — I350 Nonrheumatic aortic (valve) stenosis: Secondary | ICD-10-CM | POA: Diagnosis not present

## 2021-05-06 DIAGNOSIS — I272 Pulmonary hypertension, unspecified: Secondary | ICD-10-CM | POA: Diagnosis not present

## 2021-05-06 DIAGNOSIS — I6529 Occlusion and stenosis of unspecified carotid artery: Secondary | ICD-10-CM | POA: Diagnosis not present

## 2021-05-10 ENCOUNTER — Telehealth: Payer: Self-pay

## 2021-05-10 NOTE — Telephone Encounter (Signed)
Faxed pt assistance forms to my praluent per pt's request

## 2021-05-15 ENCOUNTER — Other Ambulatory Visit: Payer: Self-pay | Admitting: Cardiovascular Disease

## 2021-05-19 NOTE — Telephone Encounter (Signed)
Called and spoke to pt and stated that they need to call the mypraluent pt assistance at [phone] to complete the missing information on the forms and the pt voiced understanding

## 2021-06-02 NOTE — Telephone Encounter (Signed)
Called and spoke w/pt and stated that they still need to send me the last years tax return and they voiced understanding for mypraluent pt assistance to be considered

## 2021-06-03 MED ORDER — ATORVASTATIN CALCIUM 80 MG PO TABS: 80.0000 mg | ORAL_TABLET | Freq: Every day | ORAL | 3 refills | Status: DC

## 2021-06-03 NOTE — Telephone Encounter (Signed)
I have already spoken w/pt multiple times and educated on what he still needs to bring to even be considered for pt assistance such as tax forms, ssd check letter, etc... Pt now wishes to be placed on something different. I do not know how to explain it any better than I already have MANY times. Routing to the pharmd pool to PLEASE ADVISE

## 2021-06-03 NOTE — Telephone Encounter (Signed)
Called and spoke w/pt and they stated that they would rather to go back to the atorvastatin '80mg'$ , rx sent, pt voiced understanding

## 2021-06-03 NOTE — Telephone Encounter (Signed)
Pt c/o medication issue:  1. Name of Medication:  Alirocumab (PRALUENT) 150 MG/ML SOAJ  2. How are you currently taking this medication (dosage and times per day)? Injection every 3 weeks   3. Are you having a reaction (difficulty breathing--STAT)? No   4. What is your medication issue? Eric [name] is calling wanting to speak with Rexanne Mano in regards to having this medication switched to something else due to stating the company that assisted with the cost no longer existing. Please advise.

## 2021-06-03 NOTE — Telephone Encounter (Signed)
If patient is not willing to bring in required information for patient assistance, he will need to switch back to statin therapy.  Previously was on atorvastatin '80mg'$  once daily.

## 2021-06-03 NOTE — Addendum Note (Signed)
Addended by: Allean Found on: 06/03/2021 03:10 PM   Modules accepted: Orders

## 2021-06-15 ENCOUNTER — Other Ambulatory Visit: Payer: Self-pay | Admitting: Cardiovascular Disease

## 2021-07-04 NOTE — Progress Notes (Signed)
Cardiology Office Note:    Date:  07/05/2021   ID:  Eric [name], DOB [birth year], MRN [id number]  PCP:  Shon Baton, MD  Cardiologist:  Quay Burow, MD   Referring MD: Shon Baton, MD   Chief Complaint  Patient presents with   Follow-up    History of Present Illness:    Eric [name] is a 85 y.o. male with a hx of renal artery stenosis, HTN, PVC, CAD, carotid artery disease, moderate AS, HLD, and OSA. He has a history of stents to his LAD and diagonal branch on 07/14/2003 with subsequent PCI 07/31/03. He underwent left renal artery PTA and stenting in 2018. Moderate AS noted on echo 2019. He has stable right ICA stenosis. He stopped his statin for hip and leg pain with slight improvement suggesting statin intolerance. He was referred to lipid clinic, but did not fill out the appropriate paperwork for patient assistance for PCSK9i. Echo in 2019 showed normal LVEF and moderate AS. He was last seen by Dr. Gwenlyn Found 12/2020 and was doing well at that time. Repeat echo 12/2020 showed LVEF 60-65%, grade 2 DD, mild LVH, normal RV function, moderate AS.   He is here with swelling and would like to discuss his medications. He has had right leg swelling and pain for 1-2 months. He did not want to finish the patient assistance for PCSK9i and takes issue with sending his tax return and SSI information to the requesting party. Right leg swelling and pain is a bit difficult to parse out because this seems to coincide with restarting lipitor, which caused leg pain in the past. I do appreciate some unilateral swelling on the right leg.   With further questioning, he states the lower extremity swelling and pain started when he restarted the lipitor. He denies dyspnea, DOE, orthopnea, and PND.   He and his wife are nervous about lasix - apparently lasix "put her in the hospital" and they have a friend who is now "crippled" from [place].   Past Medical History:  Diagnosis Date   Anxiety state, unspecified     Arthritis    "hands" (03/20/2016)   Benign neoplasm of colon    Bilateral lower extremity edema    CAD (coronary artery disease)    a. 2004: PCI of LAD and D1   Calculus of kidney    Cerebrovascular disease, unspecified    Heart murmur    Hyperlipidemia    Irritable bowel syndrome    Meniere's disease    Myalgia and myositis, unspecified    Myocardial infarction ([place]) 2001   OSA (obstructive sleep apnea)    "had mask; wouldn't wear it" (03/20/2016)   Peripheral vascular disease, unspecified ([place])    Renal artery stenosis ([place])    a. 03/2016: s/p PTA and stenting of 90% stenosis of L renal artery with 0% residual stenosis   Right-sided carotid artery disease (HCC)    Unspecified essential hypertension    Unspecified hearing loss     Past Surgical History:  Procedure Laterality Date   CARDIAC CATHETERIZATION     CORONARY ANGIOPLASTY     CORONARY ANGIOPLASTY WITH STENT PLACEMENT     "1 + 2"   CYSTOSCOPY WITH URETEROSCOPY, STONE BASKETRY AND STENT PLACEMENT     NM MYOVIEW GHW  [id number]   post stress ejection fraction is 73% normal myocardial perfusion study, low risk scan   PERIPHERAL VASCULAR CATHETERIZATION N/A 03/20/2016   Procedure: Renal Angiography;  Surgeon: Lorretta Harp, MD;  Location:  [place] [facility];  Service: Cardiovascular;  Laterality: N/A;   RENAL ARTERY STENT  03/20/2016   TONSILLECTOMY  ~ 1945   TYMPANOSTOMY TUBE PLACEMENT Right    "for Meniere's"    Current Medications: Current Meds  Medication Sig   aspirin 81 MG tablet Take 81 mg by mouth daily.   clopidogrel (PLAVIX) 75 MG tablet TAKE 1 TABLET BY MOUTH EVERY DAY   diazepam (VALIUM) 5 MG tablet TAKE 1 TABLET EVERY 8 HOURS AS NEEDED   ezetimibe (ZETIA) 10 MG tablet TAKE 1 TABLET BY MOUTH DAILY   fluticasone (FLONASE) 50 MCG/ACT nasal spray Place 2 sprays into both nostrils daily.   furosemide (LASIX) 20 MG tablet Take 20 mg by mouth as needed (AS NEEDED FOR SWELLING).   losartan (COZAAR) 100 MG tablet  Take 100 mg by mouth daily.   meclizine (ANTIVERT) 25 MG tablet TAKE ONE TABLET BY MOUTH EVERY 8 HOURS AS NEEDED FOR DIZZINESS   metoprolol tartrate (LOPRESSOR) 50 MG tablet TAKE 1.5 TABLETS BY MOUTH TWICE DAILY   nitroGLYCERIN (NITROSTAT) 0.4 MG SL tablet Place 1 tablet (0.4 mg total) under the tongue every 5 (five) minutes as needed for chest pain.   pantoprazole (PROTONIX) 40 MG tablet Take 1 tablet (40 mg total) by mouth daily.   rosuvastatin (CRESTOR) 20 MG tablet Take 1 tablet (20 mg total) by mouth daily.   [DISCONTINUED] atorvastatin (LIPITOR) 80 MG tablet Take 1 tablet (80 mg total) by mouth daily.     Allergies:   Niaspan [niacin er], Sertraline hcl, and Tetanus toxoids   Social History   Socioeconomic History   Marital status: Married    Spouse name: ruth   Number of children: 4   Years of education: Not on file   Highest education level: Not on file  Occupational History   Occupation: Retired  Tobacco Use   Smoking status: Former    Packs/day: 1.00    Years: 15.00    Pack years: 15.00    Types: Cigarettes    Quit date: 10/02/1964    Years since quitting: 56.7   Smokeless tobacco: Never  Substance and Sexual Activity   Alcohol use: Yes    Comment: 03/20/2016 "haven't had a drink since the 1990s"   Drug use: No   Sexual activity: Not Currently  Other Topics Concern   Not on file  Social History Narrative   Not on file   Social Determinants of Health   Financial Resource Strain: Not on file  Food Insecurity: Not on file  Transportation Needs: Not on file  Physical Activity: Not on file  Stress: Not on file  Social Connections: Not on file     Family History: The patient's family history includes Breast cancer in his sister; Heart disease in his brother and father; Lung cancer in his brother.  ROS:   Please see the history of present illness.     All other systems reviewed and are negative.  EKGs/Labs/Other Studies Reviewed:    The following studies  were reviewed today:  Echo 01/24/21: 1. Left ventricular ejection fraction, by estimation, is 60 to 65%. The  left ventricle has normal function. The left ventricle has no regional  wall motion abnormalities. There is mild left ventricular hypertrophy.  Left ventricular diastolic parameters  are consistent with Grade II diastolic dysfunction (pseudonormalization).  Elevated left atrial pressure.   2. Right ventricular systolic function is normal. The right ventricular  size is normal. There is normal pulmonary artery systolic  pressure. The  estimated right ventricular systolic pressure is 25.9 mmHg.   3. The inferior vena cava is normal in size with greater than 50%  respiratory variability, suggesting right atrial pressure of 3 mmHg.   4. The mitral valve is degenerative. Trivial mitral valve regurgitation.  Mild mitral stenosis. MG 5mmHg at HR 53bpm, MVA 1.4 cm^2 by continuity  equation. Severe mitral annular calcification.   5. The aortic valve is calcified. There is moderate calcification of the  aortic valve. Aortic valve regurgitation is trivial. Moderate aortic valve  stenosis. Vmax 3.1 m/s, MG 23 mmHg, AVA 1.1 cm^2, DI 0.34   EKG:  EKG is  ordered today.  The ekg ordered today demonstrates sinus bradycardia with HR 58, inferior infarct seen in prior tracings  Recent Labs: No results found for requested labs within last 8760 hours.  Recent Lipid Panel    Component Value Date/Time   CHOL 108 07/28/2019 0909   TRIG 116 07/28/2019 0909   HDL 40 07/28/2019 0909   CHOLHDL 2.7 07/28/2019 0909   CHOLHDL 4.3 10/09/2014 0813   VLDL 53 (H) 10/09/2014 0813   LDLCALC 47 07/28/2019 0909   LDLDIRECT 63.1 07/09/2013 0822    Physical Exam:    VS:  BP 126/62   Pulse (!) 58   Ht 5\' 8"  (1.727 m)   Wt 190 lb 6.4 oz (86.4 kg)   SpO2 97%   BMI 28.95 kg/m     Wt Readings from Last 3 Encounters:  07/05/21 190 lb 6.4 oz (86.4 kg)  01/28/21 196 lb 12.8 oz (89.3 kg)  01/30/20 185 lb 3.2  oz (84 kg)     GEN:  Well nourished, well developed in no acute distress HEENT: Normal NECK: No JVD; No carotid bruits LYMPHATICS: No lymphadenopathy CARDIAC: RRR, no murmurs, rubs, gallops RESPIRATORY:  Clear to auscultation without rales, wheezing or rhonchi  ABDOMEN: Soft, non-tender, non-distended MUSCULOSKELETAL:  LE edema R > L, good pedal pulses SKIN: Warm and dry NEUROLOGIC:  Alert and oriented x 3 PSYCHIATRIC:  Normal affect   ASSESSMENT:    1. Localized swelling of right lower extremity   2. Atherosclerosis of native coronary artery of native heart without angina pectoris   3. Mixed hyperlipidemia   4. Essential hypertension   5. Lower leg edema   6. Left renal artery stenosis (HCC)   7. Aortic stenosis, moderate    PLAN:    In order of problems listed above:  Lower extremity swelling and pain - right - B LE swelling with R > L - started at the same time he restarted lipitor - will stop lipitor for 2 weeks - obtain BMP and BNP today - if BNP elevated, will order lasix 40 mg x 3 days - I do not appreciate JVP or crackles on lung exam - I was able to rule out DVT in the right lower extremity today in the office with a doppler - restart 20 mg cestor and monitor symptoms   CAD - s/p remote PCI - maintained on ASA, plavix, BB, zetia, 80 mg lipitor   Hyperlipidemia with LDL goal < 70 - need updated lipid panel - ?restarted 80 mg lipitor - continue zetia - ?patient assistance for PCSK9i   Hypertension Renal artery stenosis s/p left intervention - 100 mg losartan   Moderate AS - recent echo 12/2020   Follow up in 1 months with Dr. Gwenlyn Found.   Medication Adjustments/Labs and Tests Ordered: Current medicines are reviewed at length with the  patient today.  Concerns regarding medicines are outlined above.  Orders Placed This Encounter  Procedures   Basic metabolic panel   Brain natriuretic peptide   EKG 12-Lead   VAS [place] LOWER EXTREMITY VENOUS (DVT)    Meds ordered this encounter  Medications   rosuvastatin (CRESTOR) 20 MG tablet    Sig: Take 1 tablet (20 mg total) by mouth daily.    Dispense:  90 tablet    Refill:  3    STOP Lipitor    Signed, Ledora Bottcher, [place]  07/05/2021 1:36 PM    [facility]

## 2021-07-05 ENCOUNTER — Encounter: Payer: Self-pay | Admitting: Physician Assistant

## 2021-07-05 ENCOUNTER — Ambulatory Visit (INDEPENDENT_AMBULATORY_CARE_PROVIDER_SITE_OTHER): Payer: Medicare Other | Admitting: Physician Assistant

## 2021-07-05 ENCOUNTER — Ambulatory Visit (HOSPITAL_COMMUNITY)
Admission: RE | Admit: 2021-07-05 | Discharge: 2021-07-05 | Disposition: A | Discharge status: Home / Self Care | Payer: Medicare Other | Source: Ambulatory Visit | Attending: Cardiovascular Disease | Admitting: Cardiovascular Disease

## 2021-07-05 ENCOUNTER — Other Ambulatory Visit: Payer: Self-pay

## 2021-07-05 VITALS — BP 126/62 | HR 58 | Ht 68.0 in | Wt 190.4 lb

## 2021-07-05 DIAGNOSIS — R2241 Localized swelling, mass and lump, right lower limb: Secondary | ICD-10-CM | POA: Diagnosis not present

## 2021-07-05 DIAGNOSIS — I701 Atherosclerosis of renal artery: Secondary | ICD-10-CM | POA: Diagnosis not present

## 2021-07-05 DIAGNOSIS — R600 Localized edema: Secondary | ICD-10-CM | POA: Diagnosis not present

## 2021-07-05 DIAGNOSIS — I350 Nonrheumatic aortic (valve) stenosis: Secondary | ICD-10-CM | POA: Diagnosis not present

## 2021-07-05 DIAGNOSIS — I2510 Atherosclerotic heart disease of native coronary artery without angina pectoris: Secondary | ICD-10-CM | POA: Diagnosis not present

## 2021-07-05 DIAGNOSIS — E782 Mixed hyperlipidemia: Secondary | ICD-10-CM | POA: Diagnosis not present

## 2021-07-05 DIAGNOSIS — I10 Essential (primary) hypertension: Secondary | ICD-10-CM | POA: Diagnosis not present

## 2021-07-05 MED ORDER — ROSUVASTATIN CALCIUM 20 MG PO TABS: 20.0000 mg | ORAL_TABLET | Freq: Every day | ORAL | 3 refills | Status: DC

## 2021-07-05 NOTE — Patient Instructions (Addendum)
Medication Instructions:  STOP atorvastatin (Lipitor)  In 2 weeks-START rosuvastatin (Crestor) 20 mg daily  *If you need a refill on your cardiac medications before your next appointment, please call your pharmacy*   Lab Work: BMET, BNP today  If you have labs (blood work) drawn today and your tests are completely normal, you will receive your results only by: [place] (if you have MyChart) OR A paper copy in the mail If you have any lab test that is abnormal or we need to change your treatment, we will call you to review the results.   Testing/Procedures: Lower extremity ultrasound (right) today (R/O DVT)  Follow-Up: At [facility], you and your health needs are our priority.  As part of our continuing mission to provide you with exceptional heart care, we have created designated Provider Care Teams.  These Care Teams include your primary Cardiologist (physician) and Advanced Practice Providers (APPs -  Physician Assistants and Nurse Practitioners) who all work together to provide you with the care you need, when you need it.  We recommend signing up for the patient portal called "MyChart".  Sign up information is provided on this After Visit Summary.  MyChart is used to connect with patients for Virtual Visits (Telemedicine).  Patients are able to view lab/test results, encounter notes, upcoming appointments, etc.  Non-urgent messages can be sent to your provider as well.   To learn more about what you can do with MyChart, go to [email or web address].    Your next appointment:   1 month(s)  The format for your next appointment:   In Person  Provider:   Dr. Gwenlyn Found

## 2021-07-06 LAB — BASIC METABOLIC PANEL
BUN/Creatinine Ratio: 12 (ref 10–24)
BUN: 15 mg/dL (ref 8–27)
CO2: 27 mmol/L (ref 20–29)
Calcium: 10 mg/dL (ref 8.6–10.2)
Chloride: 104 mmol/L (ref 96–106)
Creatinine, Ser: 1.26 mg/dL (ref 0.76–1.27)
Glucose: 83 mg/dL (ref 70–99)
Potassium: 5.3 mmol/L — ABNORMAL HIGH (ref 3.5–5.2)
Sodium: 145 mmol/L — ABNORMAL HIGH (ref 134–144)
eGFR: 57 mL/min/{1.73_m2} — ABNORMAL LOW (ref >59)

## 2021-07-06 LAB — BRAIN NATRIURETIC PEPTIDE: BNP: 169.6 pg/mL — ABNORMAL HIGH (ref 0.0–100.0)

## 2021-07-09 DIAGNOSIS — Z23 Encounter for immunization: Secondary | ICD-10-CM | POA: Diagnosis not present

## 2021-07-12 DIAGNOSIS — Z961 Presence of intraocular lens: Secondary | ICD-10-CM | POA: Diagnosis not present

## 2021-07-18 ENCOUNTER — Other Ambulatory Visit (HOSPITAL_BASED_OUTPATIENT_CLINIC_OR_DEPARTMENT_OTHER): Payer: Self-pay | Admitting: Cardiovascular Disease

## 2021-07-18 DIAGNOSIS — Z8679 Personal history of other diseases of the circulatory system: Secondary | ICD-10-CM

## 2021-08-05 ENCOUNTER — Encounter: Payer: Self-pay | Admitting: Cardiovascular Disease

## 2021-08-05 ENCOUNTER — Other Ambulatory Visit: Payer: Self-pay

## 2021-08-05 ENCOUNTER — Ambulatory Visit (INDEPENDENT_AMBULATORY_CARE_PROVIDER_SITE_OTHER): Payer: Medicare Other | Admitting: Cardiovascular Disease

## 2021-08-05 DIAGNOSIS — I350 Nonrheumatic aortic (valve) stenosis: Secondary | ICD-10-CM | POA: Diagnosis not present

## 2021-08-05 DIAGNOSIS — R600 Localized edema: Secondary | ICD-10-CM

## 2021-08-05 DIAGNOSIS — I701 Atherosclerosis of renal artery: Secondary | ICD-10-CM

## 2021-08-05 DIAGNOSIS — E782 Mixed hyperlipidemia: Secondary | ICD-10-CM

## 2021-08-05 DIAGNOSIS — I10 Essential (primary) hypertension: Secondary | ICD-10-CM

## 2021-08-05 DIAGNOSIS — I2510 Atherosclerotic heart disease of native coronary artery without angina pectoris: Secondary | ICD-10-CM | POA: Diagnosis not present

## 2021-08-05 DIAGNOSIS — I739 Peripheral vascular disease, unspecified: Secondary | ICD-10-CM | POA: Diagnosis not present

## 2021-08-05 NOTE — Assessment & Plan Note (Signed)
History of renal artery stenosis status post left renal artery stenting by myself 02/06/2016.  His blood pressures have been well controlled.  Recent Dopplers suggest a patent stent with elevated renal aortic ratios of 6.3 on the right and 5.5 on the left with stable renal dimensions.  We will continue to follow this noninvasively.

## 2021-08-05 NOTE — Progress Notes (Signed)
08/05/2021 Eric [name]   September 11, 1936  [id number]  Primary Physician Shon Baton, MD Primary Cardiologist: Lorretta Harp MD Lupe Carney, [place]  HPI:  Eric [name] is a 85 y.o.  mildly to moderately overweight married Caucasian male father of 80, grandfather to 8 grandchildren who is accompanied by his wife Eric [name] today as usual. I last saw him in the office 01/28/2021.Marland Kitchen He has a history of CAD and PVOD. I stented his LAD and diagonal branch back on July 14, 2003 and subsequent stenting by Dr. Melvern Banker July 31, 2003. He had mild bilateral renal artery stenosis at cath with blockages in the 40% range as well as moderate right internal carotid artery stenosis by duplex ultrasound. He was neurologic asymptomatic. His other problems include hypertension and hyperlipidemia. He has obstructive sleep apnea followed by Dr. Claiborne Billings. His most recent lab work revealed a total cholesterol of 125, LDL of 58 and HDL of 36. Carotid Dopplers performed in September 2016 revealed moderate right ICA stenosis, unchanged from a prior study and renal Dopplers performed 01/05/16 showed progression of bilateral renal artery stenosis. CT angiography performed 02/04/16 that showed high-grade ostial left renal artery stenosis with probable right renal artery stenosis as well. The patient underwent left renal artery PTA and stenting using a 6 mm x 15 mm long balloon expandable stent with excellent angiographic result. His follow-up Dopplers performed on 10/16/16 showed marked improvement in his velocities and renal aortic ratios. Since I saw me regarding his remaining currently stable. He does have moderate aortic stenosis by 2-D echo performed 10/15/2017 the valve area 1 cm and a peak gradient of 34 mmHg.     Recent noninvasive tests show stable right ICA stenosis, patent left renal artery stent with moderate right renal artery stenosis and non-compressible vessels not allowing the determination of ABIs.  He does complain  of pain in his hips and legs which does not sound like claudication.  It may be statin related and we talked about starting a statin holiday for 3 months.   Since stopping his high-dose statin  his lower extremity pain and hip pain has significantly improved suggesting this was a statin intolerance.  As result, we did start Praluent which has improved his lipid profile.  Unfortunately, his insurance company no longer covers this and he was unable to complete the medication assistance paperwork.  2D echo performed 10/05/2017 revealed normal LV function with a valve area of 1.1 cm and a peak gradient of 31 mmHg although the patient is asymptomatic from this.   Since I saw him in the office 6 months ago his major complaint is swelling in his right greater than left leg.  He is on as needed Lasix.  Serum creatinine is stable.  His most recent echo performed 01/24/2021 revealed stable moderate aortic stenosis with a valve area of 1.1 cm and a peak gradient of approxi-40 mmHg.  He is asymptomatic from this because he really does not get out of the house or ambulate much.   Current Meds  Medication Sig   aspirin 81 MG tablet Take 81 mg by mouth daily.   clopidogrel (PLAVIX) 75 MG tablet TAKE 1 TABLET BY MOUTH EVERY DAY   diazepam (VALIUM) 5 MG tablet TAKE 1 TABLET EVERY 8 HOURS AS NEEDED   ezetimibe (ZETIA) 10 MG tablet TAKE 1 TABLET BY MOUTH DAILY   fluticasone (FLONASE) 50 MCG/ACT nasal spray Place 2 sprays into both nostrils daily.   furosemide (LASIX) 20  MG tablet Take 20 mg by mouth as needed (AS NEEDED FOR SWELLING).   losartan (COZAAR) 100 MG tablet Take 100 mg by mouth daily.   meclizine (ANTIVERT) 25 MG tablet TAKE ONE TABLET BY MOUTH EVERY 8 HOURS AS NEEDED FOR DIZZINESS   metoprolol tartrate (LOPRESSOR) 50 MG tablet TAKE 1.5 TABLETS BY MOUTH TWICE DAILY   nitroGLYCERIN (NITROSTAT) 0.4 MG SL tablet Place 1 tablet (0.4 mg total) under the tongue every 5 (five) minutes as needed for chest pain.    pantoprazole (PROTONIX) 40 MG tablet Take 1 tablet (40 mg total) by mouth daily.   rosuvastatin (CRESTOR) 20 MG tablet Take 1 tablet (20 mg total) by mouth daily.     Allergies  Allergen Reactions   Niaspan [Niacin Er]    Sertraline Hcl     REACTION: disorientation   Tetanus Toxoids     Social History   Socioeconomic History   Marital status: Married    Spouse name: ruth   Number of children: 4   Years of education: Not on file   Highest education level: Not on file  Occupational History   Occupation: Retired  Tobacco Use   Smoking status: Former    Packs/day: 1.00    Years: 15.00    Pack years: 15.00    Types: Cigarettes    Quit date: 10/02/1964    Years since quitting: 56.8   Smokeless tobacco: Never  Substance and Sexual Activity   Alcohol use: Yes    Comment: 03/20/2016 "haven't had a drink since the 1990s"   Drug use: No   Sexual activity: Not Currently  Other Topics Concern   Not on file  Social History Narrative   Not on file   Social Determinants of Health   Financial Resource Strain: Not on file  Food Insecurity: Not on file  Transportation Needs: Not on file  Physical Activity: Not on file  Stress: Not on file  Social Connections: Not on file  Intimate Partner Violence: Not on file     Review of Systems: General: negative for chills, fever, night sweats or weight changes.  Cardiovascular: negative for chest pain, dyspnea on exertion, edema, orthopnea, palpitations, paroxysmal nocturnal dyspnea or shortness of breath Dermatological: negative for rash Respiratory: negative for cough or wheezing Urologic: negative for hematuria Abdominal: negative for nausea, vomiting, diarrhea, bright red blood per rectum, melena, or hematemesis Neurologic: negative for visual changes, syncope, or dizziness All other systems reviewed and are otherwise negative except as noted above.    Blood pressure (!) 160/86, pulse (!) 58, height 5\' 8"  (1.727 m), weight 190 lb  (86.2 kg), SpO2 98 %.  General appearance: alert and no distress Neck: no adenopathy, no JVD, supple, symmetrical, trachea midline, thyroid not enlarged, symmetric, no tenderness/mass/nodules, and bilateral carotid bruits versus transmitted murmur Lungs: clear to auscultation bilaterally Heart: High-pitched outflow tract murmur at the base consistent with aortic stenosis. Extremities: extremities normal, atraumatic, no cyanosis or edema Pulses: 2+ and symmetric Skin: Skin color, texture, turgor normal. No rashes or lesions Neurologic: Grossly normal  EKG not performed today  ASSESSMENT AND PLAN:   Hyperlipidemia History of hyperlipidemia intolerant to statin drugs.  He currently is on rosuvastatin and Zetia.  He was on Praluent but unfortunately his insurance company did not cover it.  He there was issues with him filling out the medication assistance paperwork.  His last lipid profile performed 01/01/2020 revealed total cholesterol 118, LDL 54 and HDL 37.  I am going to recheck  a fasting lipid liver profile.  We will have our Pharm.D.'s assist in getting him recertified for a PCSK9  Essential hypertension History of essential hypertension a blood pressure measured today at 126/62.  He is on losartan and metoprolol.  Coronary atherosclerosis History of CAD status post LAD stenting and diagonal branch intervention July 14, 2003 by myself and subsequent stenting by Dr. Melvern Banker 07/31/2003.  He denies chest pain or shortness of breath.  Peripheral vascular disease ([place]) History of PAD with Dopplers performed 01/24/2021 revealing a right popliteal artery stenosis although he really denies claudication.  Bilateral lower extremity edema Bilateral lower extremity edema probably related to diastolic dysfunction on as needed Lasix.  Left renal artery stenosis (HCC) History of renal artery stenosis status post left renal artery stenting by myself 02/06/2016.  His blood pressures have been well  controlled.  Recent Dopplers suggest a patent stent with elevated renal aortic ratios of 6.3 on the right and 5.5 on the left with stable renal dimensions.  We will continue to follow this noninvasively.  Aortic stenosis, moderate History of moderate aortic stenosis by 2D echo performed 01/24/2021 with a valve area of 1.1 cm and a peak gradient of 37 mmHg.     Lorretta Harp MD FACP,FACC,FAHA, [facility] 08/05/2021 2:44 PM

## 2021-08-05 NOTE — Patient Instructions (Addendum)
Medication Instructions:  Your physician recommends that you continue on your current medications as directed. Please refer to the Current Medication list given to you today.  *If you need a refill on your cardiac medications before your next appointment, please call your pharmacy*   Lab Work: Your physician recommends that you return for lab work in: Next week or 2 for FASTING lipid/liver profile.  If you have labs (blood work) drawn today and your tests are completely normal, you will receive your results only by: [place] (if you have MyChart) OR A paper copy in the mail If you have any lab test that is abnormal or we need to change your treatment, we will call you to review the results.   Testing/Procedures: Your physician has requested that you have a renal artery duplex. During this test, an ultrasound is used to evaluate blood flow to the kidneys. Allow one hour for this exam. Do not eat after midnight the day before and avoid carbonated beverages. Take your medications as you usually do.  Your physician has requested that you have a lower extremity arterial duplex. This test is an ultrasound of the arteries in the legs. It looks at arterial blood flow in the legs. Allow one hour for Lower Arterial scans. There are no restrictions or special instructions   Your physician has requested that you have an ankle brachial index (ABI). During this test an ultrasound and blood pressure cuff are used to evaluate the arteries that supply the arms and legs with blood. Allow thirty minutes for this exam. There are no restrictions or special instructions.  Your physician has requested that you have a carotid duplex. This test is an ultrasound of the carotid arteries in your neck. It looks at blood flow through these arteries that supply the brain with blood. Allow one hour for this exam. There are no restrictions or special instructions. To be done in April 2023. These procedures are done at  [facility].   Your physician has requested that you have an echocardiogram. Echocardiography is a painless test that uses sound waves to create images of your heart. It provides your doctor with information about the size and shape of your heart and how well your heart's chambers and valves are working. This procedure takes approximately one hour. There are no restrictions for this procedure. To be done in April 2023. This procedure is done at [address].     Follow-Up: At [facility], you and your health needs are our priority.  As part of our continuing mission to provide you with exceptional heart care, we have created designated Provider Care Teams.  These Care Teams include your primary Cardiologist (physician) and Advanced Practice Providers (APPs -  Physician Assistants and Nurse Practitioners) who all work together to provide you with the care you need, when you need it.  We recommend signing up for the patient portal called "MyChart".  Sign up information is provided on this After Visit Summary.  MyChart is used to connect with patients for Virtual Visits (Telemedicine).  Patients are able to view lab/test results, encounter notes, upcoming appointments, etc.  Non-urgent messages can be sent to your provider as well.   To learn more about what you can do with MyChart, go to [email or web address].    Your next appointment:   6 month(s)  The format for your next appointment:   In Person  Provider:   Fabian Sharp, PA-C      Then, Roderic Palau  Gwenlyn Found, MD will plan to see you again in 12 month(s).

## 2021-08-05 NOTE — Assessment & Plan Note (Addendum)
History of hyperlipidemia intolerant to statin drugs.  He currently is on rosuvastatin and Zetia.  He was on Praluent but unfortunately his insurance company did not cover it.  He there was issues with him filling out the medication assistance paperwork.  His last lipid profile performed 01/01/2020 revealed total cholesterol 118, LDL 54 and HDL 37.  I am going to recheck a fasting lipid liver profile.  We will have our Pharm.D.'s assist in getting him recertified for a PCSK9

## 2021-08-05 NOTE — Assessment & Plan Note (Signed)
History of essential hypertension a blood pressure measured today at 126/62.  He is on losartan and metoprolol.

## 2021-08-05 NOTE — Assessment & Plan Note (Signed)
Bilateral lower extremity edema probably related to diastolic dysfunction on as needed Lasix.

## 2021-08-05 NOTE — Assessment & Plan Note (Signed)
History of CAD status post LAD stenting and diagonal branch intervention July 14, 2003 by myself and subsequent stenting by Dr. Melvern Banker 07/31/2003.  He denies chest pain or shortness of breath.

## 2021-08-05 NOTE — Assessment & Plan Note (Signed)
History of PAD with Dopplers performed 01/24/2021 revealing a right popliteal artery stenosis although he really denies claudication.

## 2021-08-05 NOTE — Assessment & Plan Note (Signed)
History of moderate aortic stenosis by 2D echo performed 01/24/2021 with a valve area of 1.1 cm and a peak gradient of 37 mmHg.

## 2021-08-08 DIAGNOSIS — E782 Mixed hyperlipidemia: Secondary | ICD-10-CM | POA: Diagnosis not present

## 2021-08-08 LAB — LIPID PANEL
Chol/HDL Ratio: 3.2 ratio (ref 0.0–5.0)
Cholesterol, Total: 140 mg/dL (ref 100–199)
HDL: 44 mg/dL (ref >39)
LDL Chol Calc (NIH): 75 mg/dL (ref 0–99)
Triglycerides: 116 mg/dL (ref 0–149)
VLDL Cholesterol Cal: 21 mg/dL (ref 5–40)

## 2021-08-08 LAB — HEPATIC FUNCTION PANEL
ALT: 14 IU/L (ref 0–44)
AST: 19 IU/L (ref 0–40)
Albumin: 4.3 g/dL (ref 3.6–4.6)
Alkaline Phosphatase: 68 IU/L (ref 44–121)
Bilirubin Total: 0.6 mg/dL (ref 0.0–1.2)
Bilirubin, Direct: 0.17 mg/dL (ref 0.00–0.40)
Total Protein: 6.7 g/dL (ref 6.0–8.5)

## 2021-08-08 NOTE — Telephone Encounter (Signed)
Called pt and stated that they were approved for [employer]. [place] approved and pt picking up form from office to take to pharmacy pt voiced understanding

## 2021-08-30 DIAGNOSIS — L821 Other seborrheic keratosis: Secondary | ICD-10-CM | POA: Diagnosis not present

## 2021-08-30 DIAGNOSIS — Z85828 Personal history of other malignant neoplasm of skin: Secondary | ICD-10-CM | POA: Diagnosis not present

## 2021-08-30 DIAGNOSIS — L814 Other melanin hyperpigmentation: Secondary | ICD-10-CM | POA: Diagnosis not present

## 2021-08-30 DIAGNOSIS — D692 Other nonthrombocytopenic purpura: Secondary | ICD-10-CM | POA: Diagnosis not present

## 2021-08-30 DIAGNOSIS — D1801 Hemangioma of skin and subcutaneous tissue: Secondary | ICD-10-CM | POA: Diagnosis not present

## 2021-09-30 DIAGNOSIS — I129 Hypertensive chronic kidney disease with stage 1 through stage 4 chronic kidney disease, or unspecified chronic kidney disease: Secondary | ICD-10-CM | POA: Diagnosis not present

## 2021-09-30 DIAGNOSIS — R519 Headache, unspecified: Secondary | ICD-10-CM | POA: Diagnosis not present

## 2021-09-30 DIAGNOSIS — Z20828 Contact with and (suspected) exposure to other viral communicable diseases: Secondary | ICD-10-CM | POA: Diagnosis not present

## 2021-09-30 DIAGNOSIS — R0981 Nasal congestion: Secondary | ICD-10-CM | POA: Diagnosis not present

## 2021-09-30 DIAGNOSIS — Z1152 Encounter for screening for COVID-19: Secondary | ICD-10-CM | POA: Diagnosis not present

## 2021-09-30 DIAGNOSIS — N1831 Chronic kidney disease, stage 3a: Secondary | ICD-10-CM | POA: Diagnosis not present

## 2021-09-30 DIAGNOSIS — R5383 Other fatigue: Secondary | ICD-10-CM | POA: Diagnosis not present

## 2021-09-30 DIAGNOSIS — J029 Acute pharyngitis, unspecified: Secondary | ICD-10-CM | POA: Diagnosis not present

## 2021-09-30 DIAGNOSIS — U071 COVID-19: Secondary | ICD-10-CM | POA: Diagnosis not present

## 2021-09-30 DIAGNOSIS — B349 Viral infection, unspecified: Secondary | ICD-10-CM | POA: Diagnosis not present

## 2021-10-05 ENCOUNTER — Telehealth: Payer: Self-pay | Admitting: Cardiovascular Disease

## 2021-10-05 DIAGNOSIS — R600 Localized edema: Secondary | ICD-10-CM

## 2021-10-05 DIAGNOSIS — Z79899 Other long term (current) drug therapy: Secondary | ICD-10-CM

## 2021-10-05 NOTE — Telephone Encounter (Signed)
Returned call to pt c/o swelling he has only been taking lasix 20mg  as needed. He has taken 20mg  Tuesday and today. He has not been taking his weight. I have directed pt to take 20mg  now. For Thursday and Friday take 40mg  in the am. Make sure to take weight daily for review and medication adjustment. I have reviewed Creatinine it is in the higher normal range. K+ is also slightly elevated. He will come in on Monday for BMET for MD review. He will call Monday with results.

## 2021-10-05 NOTE — Telephone Encounter (Signed)
Pt c/o swelling: STAT is pt has developed SOB within 24 hours  If swelling, where is the swelling located? Swelling in legs and feet.  Has been going on for the past 5 or 6 weeks, the swelling won't go down.   How much weight have you gained and in what time span? Maybe a 1 or 2 pounds  Have you gained 3 pounds in a day or 5 pounds in a week? no  Do you have a log of your daily weights (if so, list)? no  Are you currently taking a fluid pill? yes  Are you currently SOB? no  Have you traveled recently? no

## 2021-10-10 DIAGNOSIS — Z79899 Other long term (current) drug therapy: Secondary | ICD-10-CM | POA: Diagnosis not present

## 2021-10-10 DIAGNOSIS — R600 Localized edema: Secondary | ICD-10-CM | POA: Diagnosis not present

## 2021-10-10 LAB — BASIC METABOLIC PANEL
BUN/Creatinine Ratio: 18 (ref 10–24)
BUN: 24 mg/dL (ref 8–27)
CO2: 24 mmol/L (ref 20–29)
Calcium: 9.5 mg/dL (ref 8.6–10.2)
Chloride: 102 mmol/L (ref 96–106)
Creatinine, Ser: 1.35 mg/dL — ABNORMAL HIGH (ref 0.76–1.27)
Glucose: 91 mg/dL (ref 70–99)
Potassium: 4.6 mmol/L (ref 3.5–5.2)
Sodium: 141 mmol/L (ref 134–144)
eGFR: 52 mL/min/{1.73_m2} — ABNORMAL LOW (ref >59)

## 2021-10-10 NOTE — Telephone Encounter (Signed)
Patient was following up on call from last week

## 2021-10-10 NOTE — Telephone Encounter (Signed)
Spoke with patient of Dr. Gwenlyn Found. He spoke with [facility] LPN on 03/04/88. He took lasix 40mg  on Thursday and Friday as directed by LPN. He reports swelling is about gone, but has some pain in LE. He reports he had NOT been taking lasix 20mg  daily PRN for swelling often prior to talking with Sharyn Lull. Advised that he come by office for BMET as ordered.   Educated on use of PRN lasix. Explained easier to use sparingly as needed, than to wait for swelling to accumulate and he end up needed more than originally prescribed.   Thursday 1/5 weight: 183lbs Friday 1/6 weight: 182lbs Saturday 1/7 weight: 182lbs Sunday 1/8 weight: 182lbs Monday 1/9 weight: 184.5 lbs  Message routed to MD/RN to review message, labs when they result.

## 2021-10-11 ENCOUNTER — Telehealth: Payer: Self-pay | Admitting: Cardiovascular Disease

## 2021-10-11 NOTE — Telephone Encounter (Signed)
Patient's wife is requesting to go over lab results.

## 2021-10-11 NOTE — Telephone Encounter (Signed)
Results discussed with wife and patient. Patient confirmed he is taking lasix 20 mg daily PRN and reports no swelling noted in legs. Lab result mailed to patient and pcp copied.

## 2021-11-14 DIAGNOSIS — U071 COVID-19: Secondary | ICD-10-CM | POA: Diagnosis not present

## 2021-11-29 ENCOUNTER — Other Ambulatory Visit: Payer: Self-pay | Admitting: Cardiovascular Disease

## 2021-12-30 ENCOUNTER — Ambulatory Visit (HOSPITAL_BASED_OUTPATIENT_CLINIC_OR_DEPARTMENT_OTHER): Payer: Medicare Other

## 2021-12-30 ENCOUNTER — Ambulatory Visit (HOSPITAL_COMMUNITY)
Admission: RE | Admit: 2021-12-30 | Discharge: 2021-12-30 | Disposition: A | Discharge status: Home / Self Care | Payer: Medicare Other | Source: Ambulatory Visit | Attending: Cardiovascular Disease | Admitting: Cardiovascular Disease

## 2021-12-30 ENCOUNTER — Other Ambulatory Visit: Payer: Self-pay | Admitting: Cardiovascular Disease

## 2021-12-30 DIAGNOSIS — I10 Essential (primary) hypertension: Secondary | ICD-10-CM | POA: Insufficient documentation

## 2021-12-30 DIAGNOSIS — I2510 Atherosclerotic heart disease of native coronary artery without angina pectoris: Secondary | ICD-10-CM | POA: Insufficient documentation

## 2021-12-30 DIAGNOSIS — I252 Old myocardial infarction: Secondary | ICD-10-CM | POA: Insufficient documentation

## 2021-12-30 DIAGNOSIS — E669 Obesity, unspecified: Secondary | ICD-10-CM | POA: Diagnosis not present

## 2021-12-30 DIAGNOSIS — I739 Peripheral vascular disease, unspecified: Secondary | ICD-10-CM | POA: Insufficient documentation

## 2021-12-30 DIAGNOSIS — I701 Atherosclerosis of renal artery: Secondary | ICD-10-CM | POA: Insufficient documentation

## 2021-12-30 DIAGNOSIS — I351 Nonrheumatic aortic (valve) insufficiency: Secondary | ICD-10-CM | POA: Diagnosis not present

## 2021-12-30 DIAGNOSIS — G4733 Obstructive sleep apnea (adult) (pediatric): Secondary | ICD-10-CM | POA: Insufficient documentation

## 2021-12-30 DIAGNOSIS — I080 Rheumatic disorders of both mitral and aortic valves: Secondary | ICD-10-CM | POA: Diagnosis not present

## 2021-12-30 DIAGNOSIS — I350 Nonrheumatic aortic (valve) stenosis: Secondary | ICD-10-CM

## 2021-12-30 DIAGNOSIS — E782 Mixed hyperlipidemia: Secondary | ICD-10-CM | POA: Insufficient documentation

## 2021-12-30 LAB — ECHOCARDIOGRAM COMPLETE
AR max vel: 0.88 cm2
AV Area VTI: 0.89 cm2
AV Area mean vel: 0.84 cm2
AV Mean grad: 25.5 mmHg
AV Peak grad: 44.9 mmHg
Ao pk vel: 3.35 m/s
Area-P 1/2: 2.55 cm2
P 1/2 time: 352 msec
S' Lateral: 2.8 cm

## 2022-01-24 ENCOUNTER — Other Ambulatory Visit (HOSPITAL_COMMUNITY): Payer: Self-pay | Admitting: Cardiovascular Disease

## 2022-01-24 DIAGNOSIS — I6529 Occlusion and stenosis of unspecified carotid artery: Secondary | ICD-10-CM

## 2022-01-24 DIAGNOSIS — I739 Peripheral vascular disease, unspecified: Secondary | ICD-10-CM

## 2022-01-27 NOTE — Progress Notes (Signed)
?Cardiology Office Note:   ? ?Date:  02/09/2022  ? ?ID:  Eric [name], DOB 29-Apr-1936, MRN [id number] ? ?PCP:  Shon Baton, MD ?  ?[facility] Providers ?Cardiologist:  Quay Burow, MD    ? ?Referring MD: Shon Baton, MD  ? ?Chief Complaint  ?Patient presents with  ? Follow-up  ?  PAD, RAS, CAD  ? ? ?History of Present Illness:   ? ?Eric [name] is a 86 y.o. male with a hx of renal artery stenosis, HTN, PVC, CAD, carotid artery disease, moderate AS, HLD, and OSA. He has a history of stents to his LAD and diagonal branch on 07/14/2003 with subsequent PCI 07/31/03. He underwent left renal artery PTA and stenting in 2018. Moderate AS noted on echo 2019. He haas stable right ICA stenosis. He stopped his statin for hip and leg pain with slight improvement suggesting statin intolerance. He was referred to lipid clinic, but did not fill out the appropriate paperwork for patient assistance for PCSK9i. Last echo in 2019 showed normal LVEF and moderate AS. He was last seen by Dr. Gwenlyn Found 12/2020 and was doing well at that time.  ? ?He did not want to finish filling out patient assistance for PCSK9 inhibitor and took if she was sitting in his tax returns.  I saw him 07/05/2021 for leg swelling and pain that he reports started when he restarted Lipitor.  I advised a statin holiday.  He and his wife were both leery of Lipitor and Lasix.  I appreciated unilateral leg swelling and was able to rule out a DVT in the office. ? ?He saw Dr. Gwenlyn Found back in clinic 08/05/2021. Recent dopplers showed stable right ICA stenosis, patent left renal artery stenosis with progression of right renal artery stenosis (repeat in 1 year).  Noncompressible vessels on ABIs. He has pain with walking below his knees. Statin holiday did not improve his leg pain, but then states the statin made his leg pain worse. Per Dr. Kennon Holter note, statin holiday significantly improved his hip and leg pain. He completed patient assistance and is back on praluent and  is doing well.  ? ?We discussed all recent studies, upcoming studies, and symptoms of AS progression. He may need scheduled lasix or a higher dose and he doesn't want to do this. He is also hypertensive, checked three times, but does not want to add a medication today.  ? ? ?Past Medical History:  ?Diagnosis Date  ? Anxiety state, unspecified   ? Arthritis   ? "hands" (03/20/2016)  ? Benign neoplasm of colon   ? Bilateral lower extremity edema   ? CAD (coronary artery disease)   ? a. 2004: PCI of LAD and D1  ? Calculus of kidney   ? Cerebrovascular disease, unspecified   ? Heart murmur   ? Hyperlipidemia   ? Irritable bowel syndrome   ? Meniere's disease   ? Myalgia and myositis, unspecified   ? Myocardial infarction [facility]) 2001  ? OSA (obstructive sleep apnea)   ? "had mask; wouldn't wear it" (03/20/2016)  ? Peripheral vascular disease, unspecified ([place])   ? Renal artery stenosis ([place])   ? a. 03/2016: s/p PTA and stenting of 90% stenosis of L renal artery with 0% residual stenosis  ? Right-sided carotid artery disease ([place])   ? Unspecified essential hypertension   ? Unspecified hearing loss   ? ? ?Past Surgical History:  ?Procedure Laterality Date  ? CARDIAC CATHETERIZATION    ? CORONARY ANGIOPLASTY    ?  CORONARY ANGIOPLASTY WITH STENT PLACEMENT    ? "1 + 2"  ? CYSTOSCOPY WITH URETEROSCOPY, STONE BASKETRY AND STENT PLACEMENT    ? [place]  [id number]  ? post stress ejection fraction is 73% normal myocardial perfusion study, low risk scan  ? PERIPHERAL VASCULAR CATHETERIZATION N/A 03/20/2016  ? Procedure: Renal Angiography;  Surgeon: Lorretta Harp, MD;  Location: [facility];  Service: Cardiovascular;  Laterality: N/A;  ? RENAL ARTERY STENT  03/20/2016  ? TONSILLECTOMY  ~ 1945  ? TYMPANOSTOMY TUBE PLACEMENT Right   ? "for Meniere's"  ? ? ?Current Medications: ?Current Meds  ?Medication Sig  ? aspirin 81 MG tablet Take 81 mg by mouth daily.  ? clopidogrel (PLAVIX) 75 MG tablet TAKE 1 TABLET BY MOUTH EVERY DAY   ? diazepam (VALIUM) 5 MG tablet TAKE 1 TABLET EVERY 8 HOURS AS NEEDED  ? ezetimibe (ZETIA) 10 MG tablet TAKE 1 TABLET BY MOUTH DAILY  ? fluticasone (FLONASE) 50 MCG/ACT nasal spray Place 2 sprays into both nostrils daily.  ? furosemide (LASIX) 20 MG tablet Take 20 mg by mouth as needed. Take 1-2 tablets once a week  ? losartan (COZAAR) 100 MG tablet Take 100 mg by mouth daily.  ? meclizine (ANTIVERT) 25 MG tablet TAKE ONE TABLET BY MOUTH EVERY 8 HOURS AS NEEDED FOR DIZZINESS  ? metoprolol tartrate (LOPRESSOR) 50 MG tablet TAKE 1.5 TABLETS BY MOUTH TWICE DAILY  ? nitroGLYCERIN (NITROSTAT) 0.4 MG SL tablet Place 1 tablet (0.4 mg total) under the tongue every 5 (five) minutes as needed for chest pain.  ? pantoprazole (PROTONIX) 40 MG tablet Take 1 tablet (40 mg total) by mouth daily.  ? PRALUENT 150 MG/ML SOAJ INJECT 150 MG INTO THE SKIN EVERY 14 (FOURTEEN) DAYS.  ? [DISCONTINUED] predniSONE (DELTASONE) 20 MG tablet Take 20 mg by mouth daily.  ?  ? ?Allergies:   Niaspan [niacin er], Sertraline hcl, and Tetanus toxoids  ? ?Social History  ? ?Socioeconomic History  ? Marital status: Married  ?  Spouse name: ruth  ? Number of children: 4  ? Years of education: Not on file  ? Highest education level: Not on file  ?Occupational History  ? Occupation: Retired  ?Tobacco Use  ? Smoking status: Former  ?  Packs/day: 1.00  ?  Years: 15.00  ?  Pack years: 15.00  ?  Types: Cigarettes  ?  Quit date: 10/02/1964  ?  Years since quitting: 57.3  ? Smokeless tobacco: Never  ?Substance and Sexual Activity  ? Alcohol use: Yes  ?  Comment: 03/20/2016 "haven't had a drink since the 1990s"  ? Drug use: No  ? Sexual activity: Not Currently  ?Other Topics Concern  ? Not on file  ?Social History Narrative  ? Not on file  ? ?Social Determinants of Health  ? ?Financial Resource Strain: Not on file  ?Food Insecurity: Not on file  ?Transportation Needs: Not on file  ?Physical Activity: Not on file  ?Stress: Not on file  ?Social Connections: Not on  file  ?  ? ?Family History: ?The patient's family history includes Breast cancer in his sister; Heart disease in his brother and father; Lung cancer in his brother. ? ?ROS:   ?Please see the history of present illness.    ? All other systems reviewed and are negative. ? ?EKGs/Labs/Other Studies Reviewed:   ? ?The following studies were reviewed today: ? ?Echo 11/2021: ? 1. Left ventricular ejection fraction, by estimation, is 60  to 65%. The  ?left ventricle has normal function. The left ventricle has no regional  ?wall motion abnormalities. There is mild concentric left ventricular  ?hypertrophy. Left ventricular diastolic  ?parameters are consistent with Grade II diastolic dysfunction  ?(pseudonormalization). Elevated left ventricular end-diastolic pressure.  ? 2. Right ventricular systolic function is normal. The right ventricular  ?size is normal. There is normal pulmonary artery systolic pressure. The  ?estimated right ventricular systolic pressure is 48.5 mmHg.  ? 3. Left atrial size was mildly dilated.  ? 4. The mitral valve is degenerative. Mild mitral valve regurgitation.  ?Mild mitral stenosis. The mean mitral valve gradient is 4.0 mmHg. Severe  ?mitral annular calcification.  ? 5. The aortic valve is calcified. There is severe calcifcation of the  ?aortic valve. There is severe thickening of the aortic valve. Aortic valve  ?regurgitation is moderate. Moderate to severe aortic valve stenosis.  ?Aortic regurgitation PHT measures 352  ?msec. Aortic valve area, by VTI measures 0.89 cm?Marland Kitchen Aortic valve mean  ?gradient measures 25.5 mmHg. Aortic valve Vmax measures 3.35 m/s. DVI is  ?0.28 consistene with moderate to severe AS.  ? 6. The inferior vena cava is normal in size with greater than 50%  ?respiratory variability, suggesting right atrial pressure of 3 mmHg.  ? 7. Compared to study dated 01/24/2021, the mean AVG has increased from 23  ?to 53mHg, the DVI has decreased from 0.34 to 0.28 and AVA has decreased   ?from 1.1cm2 to 0.89cm2 consistent with progression of aortic stenosis.  ?There is now also moderate AI.  ? ?EKG:  EKG is not ordered today.   ? ?Recent Labs: ?07/05/2021: BNP 169.6 ?08/08/2021: ALT 14 ?1/

## 2022-02-01 ENCOUNTER — Ambulatory Visit (HOSPITAL_BASED_OUTPATIENT_CLINIC_OR_DEPARTMENT_OTHER)
Admission: RE | Admit: 2022-02-01 | Discharge: 2022-02-01 | Disposition: A | Discharge status: Home / Self Care | Payer: Medicare Other | Source: Ambulatory Visit | Attending: Cardiovascular Disease | Admitting: Cardiovascular Disease

## 2022-02-01 ENCOUNTER — Ambulatory Visit (HOSPITAL_COMMUNITY)
Admission: RE | Admit: 2022-02-01 | Discharge: 2022-02-01 | Disposition: A | Discharge status: Home / Self Care | Payer: Medicare Other | Source: Ambulatory Visit | Attending: Cardiovascular Disease | Admitting: Cardiovascular Disease

## 2022-02-01 DIAGNOSIS — I6523 Occlusion and stenosis of bilateral carotid arteries: Secondary | ICD-10-CM | POA: Diagnosis not present

## 2022-02-01 DIAGNOSIS — I739 Peripheral vascular disease, unspecified: Secondary | ICD-10-CM | POA: Insufficient documentation

## 2022-02-01 DIAGNOSIS — I6529 Occlusion and stenosis of unspecified carotid artery: Secondary | ICD-10-CM | POA: Insufficient documentation

## 2022-02-09 ENCOUNTER — Ambulatory Visit (INDEPENDENT_AMBULATORY_CARE_PROVIDER_SITE_OTHER): Payer: Medicare Other | Admitting: Physician Assistant

## 2022-02-09 ENCOUNTER — Encounter: Payer: Self-pay | Admitting: Physician Assistant

## 2022-02-09 VITALS — BP 158/80 | HR 55 | Ht 68.0 in | Wt 190.0 lb

## 2022-02-09 DIAGNOSIS — I2510 Atherosclerotic heart disease of native coronary artery without angina pectoris: Secondary | ICD-10-CM

## 2022-02-09 DIAGNOSIS — I739 Peripheral vascular disease, unspecified: Secondary | ICD-10-CM

## 2022-02-09 DIAGNOSIS — M7989 Other specified soft tissue disorders: Secondary | ICD-10-CM

## 2022-02-09 DIAGNOSIS — I10 Essential (primary) hypertension: Secondary | ICD-10-CM | POA: Diagnosis not present

## 2022-02-09 DIAGNOSIS — E782 Mixed hyperlipidemia: Secondary | ICD-10-CM | POA: Diagnosis not present

## 2022-02-09 DIAGNOSIS — I350 Nonrheumatic aortic (valve) stenosis: Secondary | ICD-10-CM

## 2022-02-09 DIAGNOSIS — I701 Atherosclerosis of renal artery: Secondary | ICD-10-CM | POA: Diagnosis not present

## 2022-02-09 NOTE — Patient Instructions (Addendum)
Medication Instructions:  ?Your physician recommends that you continue on your current medications as directed. Please refer to the Current Medication list given to you today. ? ?*If you need a refill on your cardiac medications before your next appointment, please call your pharmacy* ? ?Lab Work: ?NONE ordered at this time of appointment  ? ?If you have labs (blood work) drawn today and your tests are completely normal, you will receive your results only by: ?MyChart Message (if you have MyChart) OR ?A paper copy in the mail ?If you have any lab test that is abnormal or we need to change your treatment, we will call you to review the results. ? ?Testing/Procedures: ?NONE ordered at this time of appointment  ? ?Follow-Up: ?At [facility], you and your health needs are our priority.  As part of our continuing mission to provide you with exceptional heart care, we have created designated Provider Care Teams.  These Care Teams include your primary Cardiologist (physician) and Advanced Practice Providers (APPs -  Physician Assistants and Nurse Practitioners) who all work together to provide you with the care you need, when you need it. ? ?We recommend signing up for the patient portal called "MyChart".  Sign up information is provided on this After Visit Summary.  MyChart is used to connect with patients for Virtual Visits (Telemedicine).  Patients are able to view lab/test results, encounter notes, upcoming appointments, etc.  Non-urgent messages can be sent to your provider as well.   ?To learn more about what you can do with MyChart, go to [email or web address].   ? ?Your next appointment:   ?6-7 month(s) ? ?The format for your next appointment:   ?In Person ? ?Provider:   ?Quay Burow, MD   ? ? ?Other Instructions ?MONITOR blood pressure daily at home. Take blood pressure 1-2 hours after eating breakfast and taking morning medications.  ? ?Important Information About Sugar ? ? ? ? ? ? ?

## 2022-02-14 ENCOUNTER — Telehealth: Payer: Self-pay | Admitting: Cardiovascular Disease

## 2022-02-14 ENCOUNTER — Telehealth: Payer: Self-pay | Admitting: Physician Assistant

## 2022-02-14 NOTE — Telephone Encounter (Signed)
Wife asked for how long patient needs his blood pressure monitor. I explained that A. [facility] wants him to check BP 1-2 hours after taking bp medication to ensure the medication is working for patient. She stated patient doesn't want to always check it. I suggested to check it every other day. Wife stated she didn't want to check it. I explained that it was up to her, but continued to recommend checking BP. ?

## 2022-02-14 NOTE — Telephone Encounter (Signed)
Patient wife states they were advised for patient to check he BP daily.  However, they weren't told for how long they need to do that for.  Please advise. ?

## 2022-02-14 NOTE — Telephone Encounter (Signed)
Called pt's wife back regarding blood pressure monitoring. Pt wanted to discuss the frequency of taking and logging his blood pressure. Pt feels like taking his pressure everyday and recording it is a little too much. Pt states that he doesn't have time everyday to take his blood pressure and keep a log. Pt will take his blood pressure 1-2 times a week and record it. In 6 weeks pt will call us with his log for Dr. Gwenlyn Found to review. Pt inquires about normal values for blood pressure. Pt provides me with his blood pressure readings since his office visit on 5/11.  ? ?5/12: 149/69 ?5/13: 136/64 ?5/14: 145/63 ?5/15: 125/65 ?5/16: 139/71 ? ?Pt verbalizes understanding and will call back in about 6 weeks to provide [place] with his blood pressure log.  ?

## 2022-02-14 NOTE — Telephone Encounter (Signed)
Pt's wife calling in regards to pt's BP monitoring. She states that she has questions. Please advise ?

## 2022-02-15 DIAGNOSIS — E1122 Type 2 diabetes mellitus with diabetic chronic kidney disease: Secondary | ICD-10-CM | POA: Diagnosis not present

## 2022-02-15 DIAGNOSIS — E785 Hyperlipidemia, unspecified: Secondary | ICD-10-CM | POA: Diagnosis not present

## 2022-03-28 ENCOUNTER — Telehealth: Payer: Self-pay | Admitting: Physician Assistant

## 2022-03-28 ENCOUNTER — Telehealth: Payer: Self-pay

## 2022-04-01 ENCOUNTER — Other Ambulatory Visit: Payer: Self-pay | Admitting: Cardiovascular Disease

## 2022-04-01 DIAGNOSIS — I2510 Atherosclerotic heart disease of native coronary artery without angina pectoris: Secondary | ICD-10-CM

## 2022-04-01 DIAGNOSIS — E782 Mixed hyperlipidemia: Secondary | ICD-10-CM

## 2022-05-08 DIAGNOSIS — E785 Hyperlipidemia, unspecified: Secondary | ICD-10-CM | POA: Diagnosis not present

## 2022-05-08 DIAGNOSIS — F419 Anxiety disorder, unspecified: Secondary | ICD-10-CM | POA: Diagnosis not present

## 2022-05-08 DIAGNOSIS — R7989 Other specified abnormal findings of blood chemistry: Secondary | ICD-10-CM | POA: Diagnosis not present

## 2022-05-08 DIAGNOSIS — Z125 Encounter for screening for malignant neoplasm of prostate: Secondary | ICD-10-CM | POA: Diagnosis not present

## 2022-05-08 DIAGNOSIS — R739 Hyperglycemia, unspecified: Secondary | ICD-10-CM | POA: Diagnosis not present

## 2022-05-15 DIAGNOSIS — E785 Hyperlipidemia, unspecified: Secondary | ICD-10-CM | POA: Diagnosis not present

## 2022-05-15 DIAGNOSIS — I6529 Occlusion and stenosis of unspecified carotid artery: Secondary | ICD-10-CM | POA: Diagnosis not present

## 2022-05-15 DIAGNOSIS — R82998 Other abnormal findings in urine: Secondary | ICD-10-CM | POA: Diagnosis not present

## 2022-05-15 DIAGNOSIS — I350 Nonrheumatic aortic (valve) stenosis: Secondary | ICD-10-CM | POA: Diagnosis not present

## 2022-05-15 DIAGNOSIS — I739 Peripheral vascular disease, unspecified: Secondary | ICD-10-CM | POA: Diagnosis not present

## 2022-05-15 DIAGNOSIS — E1122 Type 2 diabetes mellitus with diabetic chronic kidney disease: Secondary | ICD-10-CM | POA: Diagnosis not present

## 2022-05-15 DIAGNOSIS — I701 Atherosclerosis of renal artery: Secondary | ICD-10-CM | POA: Diagnosis not present

## 2022-05-15 DIAGNOSIS — I272 Pulmonary hypertension, unspecified: Secondary | ICD-10-CM | POA: Diagnosis not present

## 2022-05-15 DIAGNOSIS — R600 Localized edema: Secondary | ICD-10-CM | POA: Diagnosis not present

## 2022-05-15 DIAGNOSIS — Z0000 Encounter for general adult medical examination without abnormal findings: Secondary | ICD-10-CM | POA: Diagnosis not present

## 2022-05-15 DIAGNOSIS — I129 Hypertensive chronic kidney disease with stage 1 through stage 4 chronic kidney disease, or unspecified chronic kidney disease: Secondary | ICD-10-CM | POA: Diagnosis not present

## 2022-05-15 DIAGNOSIS — Z1331 Encounter for screening for depression: Secondary | ICD-10-CM | POA: Diagnosis not present

## 2022-05-15 DIAGNOSIS — N1831 Chronic kidney disease, stage 3a: Secondary | ICD-10-CM | POA: Diagnosis not present

## 2022-05-15 DIAGNOSIS — I2510 Atherosclerotic heart disease of native coronary artery without angina pectoris: Secondary | ICD-10-CM | POA: Diagnosis not present

## 2022-05-15 DIAGNOSIS — Z1389 Encounter for screening for other disorder: Secondary | ICD-10-CM | POA: Diagnosis not present

## 2022-05-22 ENCOUNTER — Telehealth: Payer: Self-pay | Admitting: Cardiovascular Disease

## 2022-05-22 DIAGNOSIS — I739 Peripheral vascular disease, unspecified: Secondary | ICD-10-CM

## 2022-05-22 NOTE — Telephone Encounter (Signed)
Patient made aware. LEA order has been placed and message sent to scheduling.  Lorretta Harp, MD  You 3 minutes ago (3:37 PM)    Needs LEAs then ROV

## 2022-05-22 NOTE — Telephone Encounter (Signed)
Pt states that his legs have been giving him trouble. He says they are weak, painful and he has trouble walking as well. Please advise

## 2022-05-22 NOTE — Telephone Encounter (Signed)
Pt is calling back for an update.

## 2022-05-22 NOTE — Telephone Encounter (Signed)
Returned the call to the patient. He stated that he has been having bilateral, lower extremity, leg pain for a few weeks. It started on the left side and is now on the right side as well. He also has swelling from the knee down.  He stated that the pain is worse when he ambulates and eases when he is at rest. He denies discoloration but did state that he feels like his legs get numb at times and he feels like they will give out on him. He denies shortness of breath and weight gain.   His PCP has given his Furosemide 20 mg 4 times a week. He stated that this has not helped with the swelling. He refused an appointment with APP and only wants to see Dr. Gwenlyn Found.

## 2022-05-26 ENCOUNTER — Ambulatory Visit (HOSPITAL_COMMUNITY)
Admission: RE | Admit: 2022-05-26 | Discharge: 2022-05-26 | Disposition: A | Discharge status: Home / Self Care | Payer: Medicare Other | Source: Ambulatory Visit | Attending: Cardiology | Admitting: Cardiology

## 2022-05-26 ENCOUNTER — Telehealth: Payer: Self-pay | Admitting: Cardiovascular Disease

## 2022-05-26 DIAGNOSIS — I739 Peripheral vascular disease, unspecified: Secondary | ICD-10-CM

## 2022-05-26 NOTE — Telephone Encounter (Signed)
Attempted to reach the patient. Phone kept ringing, no voicemail.  ABI:  Ledora Bottcher, PA  05/26/2022  4:31 PM EDT     Can you please get a sooner appt with Dr. Gwenlyn Found?   LEA:   Lorretta Harp, MD  05/26/2022  4:30 PM EDT     Mild progression bilaterally.  Repeat 12 months

## 2022-05-26 NOTE — Telephone Encounter (Signed)
Wife calling in to get patient results because his left leg is weak. Please advise

## 2022-05-29 NOTE — Telephone Encounter (Signed)
Patient stated his left leg is weak and he staggers when he walks. Both leges edematous from the knee down with pain on ambulation. Stated he takes Lasix '20mg'$  on M-W-F-Sat. Recommended he keep legs moisturized with lotion and elevate legs when sitting. Reviewed fall precautions with patient.

## 2022-05-29 NOTE — Telephone Encounter (Signed)
Pt spouse returning a call. Pt spouse said to call either her or pt

## 2022-05-30 NOTE — Telephone Encounter (Signed)
Made appointment with E. Monge for 8/31. No change in edema, but reports cramping in both calves.

## 2022-06-01 ENCOUNTER — Ambulatory Visit: Payer: Medicare Other | Attending: Nurse Practitioner | Admitting: Nurse Practitioner

## 2022-06-01 ENCOUNTER — Encounter: Payer: Self-pay | Admitting: Nurse Practitioner

## 2022-06-01 VITALS — BP 140/70 | HR 55 | Ht 68.0 in | Wt 189.0 lb

## 2022-06-01 DIAGNOSIS — E785 Hyperlipidemia, unspecified: Secondary | ICD-10-CM | POA: Diagnosis not present

## 2022-06-01 DIAGNOSIS — I739 Peripheral vascular disease, unspecified: Secondary | ICD-10-CM | POA: Diagnosis not present

## 2022-06-01 DIAGNOSIS — I2510 Atherosclerotic heart disease of native coronary artery without angina pectoris: Secondary | ICD-10-CM | POA: Insufficient documentation

## 2022-06-01 DIAGNOSIS — I5189 Other ill-defined heart diseases: Secondary | ICD-10-CM | POA: Insufficient documentation

## 2022-06-01 DIAGNOSIS — R600 Localized edema: Secondary | ICD-10-CM | POA: Diagnosis not present

## 2022-06-01 DIAGNOSIS — I350 Nonrheumatic aortic (valve) stenosis: Secondary | ICD-10-CM | POA: Insufficient documentation

## 2022-06-01 DIAGNOSIS — G4733 Obstructive sleep apnea (adult) (pediatric): Secondary | ICD-10-CM | POA: Diagnosis not present

## 2022-06-01 DIAGNOSIS — I10 Essential (primary) hypertension: Secondary | ICD-10-CM | POA: Insufficient documentation

## 2022-06-01 DIAGNOSIS — I6523 Occlusion and stenosis of bilateral carotid arteries: Secondary | ICD-10-CM | POA: Insufficient documentation

## 2022-06-01 NOTE — Patient Instructions (Signed)
Medication Instructions:  Your physician recommends that you continue on your current medications as directed. Please refer to the Current Medication list given to you today.   *If you need a refill on your cardiac medications before your next appointment, please call your pharmacy*   Lab Work: Your physician recommends that you complete labs today BMET  If you have labs (blood work) drawn today and your tests are completely normal, you will receive your results only by: MyChart Message (if you have MyChart) OR A paper copy in the mail If you have any lab test that is abnormal or we need to change your treatment, we will call you to review the results.   Testing/Procedures: NONE ordered at this time of appointment     Follow-Up: At [facility], you and your health needs are our priority.  As part of our continuing mission to provide you with exceptional heart care, we have created designated Provider Care Teams.  These Care Teams include your primary Cardiologist (physician) and Advanced Practice Providers (APPs -  Physician Assistants and Nurse Practitioners) who all work together to provide you with the care you need, when you need it.  We recommend signing up for the patient portal called "MyChart".  Sign up information is provided on this After Visit Summary.  MyChart is used to connect with patients for Virtual Visits (Telemedicine).  Patients are able to view lab/test results, encounter notes, upcoming appointments, etc.  Non-urgent messages can be sent to your provider as well.   To learn more about what you can do with MyChart, go to [email or web address].    Your next appointment:    Keep follow up   The format for your next appointment:   In Person  Provider:   Quay Burow, MD     Other Instructions   Important Information About Sugar

## 2022-06-01 NOTE — Progress Notes (Signed)
Office Visit    Patient Name: Eric [name] Date of Encounter: 06/01/2022  Primary Care Provider:  Shon Baton, MD Primary Cardiologist:  Quay Burow, MD  Chief Complaint    86 year old male with a history of CAD s/p stentin-LAD and D1, PCI-LAD, moderate AS, diastolic dysfunction, bilateral lower extremity edema, carotid artery disease, PVCs, PAD, renal artery stenosis, hypertension, hyperlipidemia, and OSA who presents for follow-up related to AS and lower extremity edema.   Past Medical History    Past Medical History:  Diagnosis Date   Anxiety state, unspecified    Arthritis    "hands" (03/20/2016)   Benign neoplasm of colon    Bilateral lower extremity edema    CAD (coronary artery disease)    a. 2004: PCI of LAD and D1   Calculus of kidney    Cerebrovascular disease, unspecified    Heart murmur    Hyperlipidemia    Irritable bowel syndrome    Meniere's disease    Myalgia and myositis, unspecified    Myocardial infarction ([place]) 2001   OSA (obstructive sleep apnea)    "had mask; wouldn't wear it" (03/20/2016)   Peripheral vascular disease, unspecified ([place])    Renal artery stenosis ([place])    a. 03/2016: s/p PTA and stenting of 90% stenosis of L renal artery with 0% residual stenosis   Right-sided carotid artery disease ([facility])    Unspecified essential hypertension    Unspecified hearing loss    Past Surgical History:  Procedure Laterality Date   CARDIAC CATHETERIZATION     CORONARY ANGIOPLASTY     CORONARY ANGIOPLASTY WITH STENT PLACEMENT     "1 + 2"   CYSTOSCOPY WITH URETEROSCOPY, STONE BASKETRY AND STENT PLACEMENT     NM MYOVIEW LTD  [id number]   post stress ejection fraction is 73% normal myocardial perfusion study, low risk scan   PERIPHERAL VASCULAR CATHETERIZATION N/A 03/20/2016   Procedure: Renal Angiography;  Surgeon: Lorretta Harp, MD;  Location: [place] [facility];  Service: Cardiovascular;  Laterality: N/A;   RENAL ARTERY STENT  03/20/2016    TONSILLECTOMY  ~ 1945   TYMPANOSTOMY TUBE PLACEMENT Right    "for Meniere's"    Allergies  Allergies  Allergen Reactions   Niaspan [Niacin Er]    Sertraline Hcl     REACTION: disorientation   Tetanus Toxoids     History of Present Illness    86 year old male with the above past medical history including CAD s/p stenting-LAD and D1, PCI-LAD, moderate AS, diastolic dysfunction, bilateral lower extremity edema, carotid artery disease, PVCs, PAD, renal artery stenosis, hypertension, hyperlipidemia, and OSA.  He has a history of prior stents to his LAD and diagonal branch in 2004 with subsequent PCI.  He underwent left renal artery PTA and stenting in 2018.  He has a history of moderate aortic stenosis noted on echocardiogram in 2019.  Additionally, he has a history of carotid artery disease with stable R ICA stenosis.  He has a history of statin intolerance and was previously referred to the lipid clinic, but did not fill out appropriate paperwork for patient assistance for PCSK9 inhibitor, subsequently started on Praluent.  He has a history of lower extremity edema setting of diastolic dysfunction and AS, managed with oral Lasix.  Echocardiogram in 11/2021 showed EF 60 to 65%, normal LV function, no RWMA, mild concentric LVH, G2 DD, normal RV systolic function, mild mitral valve regurgitation, severe calcification of aortic valve, moderate aortic valve regurgitation, moderate to severe aortic  valve stenosis, mean gradient 25.5 mmHg.  Repeat echocardiogram was recommended in 12 months.  Most recent carotid Dopplers in 01/2022 showed 1 to 39% B ICA stenosis, repeat study recommended in 12 months.  ABIs in 01/2022 showed noncompressible vessels bilaterally.  He was last seen in the office on 02/09/2022 and was stable overall from a cardiac standpoint though he did note ongoing bilateral lower extremity edema.  Scheduled Lasix dosing was recommended, however, patient declined.  He contacted our office on  05/22/2022 with rep orts of increased bilateral lower extremity edema, difficulty walking.  Stated his Lasix was not helping with his symptoms and he requested to see Dr. Gwenlyn Found, however, follow-up with PCP was scheduled instead.  Repeat lower extremity arterial duplex showed mild progression of bilateral PAD.  Reviewed by Dr. Gwenlyn Found, follow-up study was recommended in 1 year.  He presents today for follow-up accompanied by his wife.  Since his last visit and since he contacted our office he has been stable from a cardiac standpoint.  His lower extremity edema has improved significantly, though he continues to have mild nonpitting bilateral lower extremity edema.  Additionally, his leg pain/weakness has improved.  His PCP increased his Lasix to 80 mg daily on Monday, Wednesday, Friday, Saturday.  Weight is stable.  He denies any chest pain, dyspnea, PND, orthopnea.  Overall, he reports feeling better, and other than his ongoing lower extremity edema, recent leg pain/weakness, he denies any additional concerns today.  Home Medications    Current Outpatient Medications  Medication Sig Dispense Refill   Alirocumab (PRALUENT) 150 MG/ML SOAJ INJECT 150 MG INTO THE SKIN EVERY 14 (FOURTEEN) DAYS. 6 mL 3   aspirin 81 MG tablet Take 81 mg by mouth daily.     clopidogrel (PLAVIX) 75 MG tablet TAKE 1 TABLET BY MOUTH EVERY DAY 90 tablet 3   diazepam (VALIUM) 5 MG tablet TAKE 1 TABLET EVERY 8 HOURS AS NEEDED 90 tablet 3   ezetimibe (ZETIA) 10 MG tablet TAKE 1 TABLET BY MOUTH DAILY 30 tablet 9   fluticasone (FLONASE) 50 MCG/ACT nasal spray Place 2 sprays into both nostrils daily.  2   furosemide (LASIX) 20 MG tablet Take 80 mg by mouth as needed. Take 4 tablets on Monday, Weds, Friday & Sat     losartan (COZAAR) 100 MG tablet Take 100 mg by mouth daily.  2   meclizine (ANTIVERT) 25 MG tablet TAKE ONE TABLET BY MOUTH EVERY 8 HOURS AS NEEDED FOR DIZZINESS 90 tablet 2   metoprolol tartrate (LOPRESSOR) 50 MG tablet TAKE  1.5 TABLETS BY MOUTH TWICE DAILY 270 tablet 1   nitroGLYCERIN (NITROSTAT) 0.4 MG SL tablet Place 1 tablet (0.4 mg total) under the tongue every 5 (five) minutes as needed for chest pain. 25 tablet 4   pantoprazole (PROTONIX) 40 MG tablet Take 1 tablet (40 mg total) by mouth daily. 90 tablet 3   No current facility-administered medications for this visit.     Review of Systems    He denies chest pain, palpitations, dyspnea, pnd, orthopnea, n, v, dizziness, syncope, weight gain, or early satiety. All other systems reviewed and are otherwise negative except as noted above.   Physical Exam    VS:  BP (!) 140/70 (BP Location: Left Arm, Patient Position: Sitting, Cuff Size: Normal)   Pulse (!) 55   Ht '5\' 8"'$  (1.727 m)   Wt 189 lb (85.7 kg)   BMI 28.74 kg/m  GEN: Well nourished, well developed, in no acute distress.  HEENT: normal. Neck: Supple, no JVD, carotid bruits, or masses. Cardiac: RRR, 3/6 murmur, no rubs, or gallops. No clubbing, cyanosis, non-pitting bilateral lower extremity edema.  Radials/DP/PT 2+ and equal bilaterally.  Respiratory:  Respirations regular and unlabored, clear to auscultation bilaterally. GI: Soft, nontender, nondistended, BS + x 4. MS: no deformity or atrophy. Skin: warm and dry, no rash. Neuro:  Strength and sensation are intact. Psych: Normal affect.  Accessory Clinical Findings    ECG personally reviewed by me today -sinus bradycardia, 55 bpm- no acute changes.   Lab Results  Component Value Date   WBC 11.4 (H) 03/21/2016   HGB 12.6 (L) 03/21/2016   HCT 39.0 03/21/2016   MCV 93.1 03/21/2016   PLT 227 03/21/2016   Lab Results  Component Value Date   CREATININE 1.35 (H) 10/10/2021   BUN 24 10/10/2021   NA 141 10/10/2021   K 4.6 10/10/2021   CL 102 10/10/2021   CO2 24 10/10/2021   Lab Results  Component Value Date   ALT 14 08/08/2021   AST 19 08/08/2021   ALKPHOS 68 08/08/2021   BILITOT 0.6 08/08/2021   Lab Results  Component Value Date    CHOL 140 08/08/2021   HDL 44 08/08/2021   LDLCALC 75 08/08/2021   LDLDIRECT 63.1 07/09/2013   TRIG 116 08/08/2021   CHOLHDL 3.2 08/08/2021    Lab Results  Component Value Date   HGBA1C 6.6 (H) 07/09/2013    Assessment & Plan    1. Bilateral lower extremity edema: He noticed over the past several weeks a dramatic increase in bilateral lower extremity edema that extended up to his knees, leg pain, and leg weakness.  He denies any falls, denies dyspnea, PND, orthopnea, weight gain.  His PCP increased his Lasix to 80 mg 4 times per week and his symptoms dramatically improved.  Weight is stable.  Fluid volume overload likely in the setting of diastolic dysfunction/AS. We will check BMET today.  Continue Lasix.  Discussed ED precautions, ongoing monitoring with daily weights, sodium and fluid recommendations.   2. Diastolic dysfunction: Most recent echo in 11/2021 showed EF 60 to 65%, normal LV function, no RWMA, mild concentric LVH, G2 DD, normal RV systolic function, mild mitral valve regurgitation, severe calcification of aortic valve, moderate aortic valve regurgitation, moderate to severe aortic valve stenosis, mean gradient 25.5 mmHg.  He does have recent evidence of fluid volume overload with worsening bilateral lower extremity edema, improved with increased Lasix dosing.  Continue Lasix, metoprolol, losartan.  3. Aortic stenosis: Moderate to severe on most recent echocardiogram.  He does have evidence of mild fluid volume overload.  Repeat echo was recommended in 1 year, however, if symptoms progress, consider repeat echo sooner.  Continue Lasix as above.  4. CAD: He has a history of prior stents to his LAD and diagonal branch thousand 4 with subsequent PCI. Stable with no anginal symptoms. No indication for ischemic evaluation.  Continue aspirin, Plavix, metoprolol, losartan, Zetia, and Praulent.   5. PAD: He did have recent leg pain, weakness, improved with diuresis. Repeat lower  extremity arterial duplex in 05/2022 showed mild progression of bilateral PAD.  Follow-up study was recommended in 1 year.  He has follow-up with Dr. Gwenlyn Found in November 2023 and can discuss findings further at that time.  Continue ASA, Plavix, Zetia, and Praulent.   6. Carotid artery disease: Most recent carotid Dopplers in 01/2022 showed 1 to 39% B ICA stenosis, asymptomatic.  Repeat study recommended in 12  months.   7. Hypertension: BP well controlled. Continue current antihypertensive regimen.   8. Hyperlipidemia: LDL was 75 in 08/2021. Continue aspirin, Plavix, Zetia, and Praluent.  9. Disposition: Follow-up as scheduled with Dr. Gwenlyn Found in 08/2022.   Lenna Sciara, NP 06/01/2022, 4:33 PM

## 2022-06-02 LAB — BASIC METABOLIC PANEL
BUN/Creatinine Ratio: 14 (ref 10–24)
BUN: 22 mg/dL (ref 8–27)
CO2: 24 mmol/L (ref 20–29)
Calcium: 9.5 mg/dL (ref 8.6–10.2)
Chloride: 104 mmol/L (ref 96–106)
Creatinine, Ser: 1.56 mg/dL — ABNORMAL HIGH (ref 0.76–1.27)
Glucose: 88 mg/dL (ref 70–99)
Potassium: 4.9 mmol/L (ref 3.5–5.2)
Sodium: 144 mmol/L (ref 134–144)
eGFR: 44 mL/min/{1.73_m2} — ABNORMAL LOW (ref >59)

## 2022-06-06 ENCOUNTER — Telehealth: Payer: Self-pay | Admitting: Nurse Practitioner

## 2022-06-06 ENCOUNTER — Telehealth: Payer: Self-pay

## 2022-06-06 DIAGNOSIS — H10502 Unspecified blepharoconjunctivitis, left eye: Secondary | ICD-10-CM | POA: Diagnosis not present

## 2022-06-06 NOTE — Telephone Encounter (Signed)
Wife called stating she has a eye appt in a little bit, if he can be called on his cell [phone]

## 2022-06-06 NOTE — Telephone Encounter (Signed)
Left a detailed message on machine with lab results. Pt advised to call back with any questions or concerns.

## 2022-06-06 NOTE — Telephone Encounter (Signed)
Spoke with pts spouse per Diona Browner, NP. Per pts spouse, pt is taking Lasix per pts PCP, pt just had lab work completed and they would like to see Dr. Gwenlyn Found only. Per pts spouse, Dr. Gwenlyn Found has more answers for them.

## 2022-06-06 NOTE — Telephone Encounter (Signed)
Spoke to patient's wife lab results given.She requested a sooner appointment with Dr.Berry. Appointment scheduled 10/3 at 1:45 pm.Stated husband is having weakness in left leg.He continues to have swelling in both lower legs.

## 2022-06-06 NOTE — Telephone Encounter (Signed)
Patient returned call for lab results.  

## 2022-06-06 NOTE — Telephone Encounter (Addendum)
Spoke to pt's wife. She report pt is still experiencing pain and weakness in left leg.  She report they were in [place] the other day and his leg completely gave out, almost causing him to fall. She report pain ease with rest but never fully goes away. She also report bilateral leg and feet swelling but denies SOB.  Will forward to NP for recommendations.    Wife also made aware of lab results

## 2022-06-06 NOTE — Telephone Encounter (Signed)
Pt is returning call. Requesting call back. Per different phone note, requesting call back on home first and then on cell phone # [phone] in case they are at the eye doctor.

## 2022-06-07 ENCOUNTER — Telehealth: Payer: Self-pay | Admitting: Pharmacist

## 2022-06-07 NOTE — Telephone Encounter (Signed)
Discussed with Dr. Gwenlyn Found. Per Dr. Gwenlyn Found ok for pt to stay on lasix as prescribed. Pt has an appointment on 07/04/22 at 1:45pm, per Dr. Gwenlyn Found this is an appropriate amount of time for a follow up visit.

## 2022-06-07 NOTE — Telephone Encounter (Signed)
Patient called to ask about [place].  Advised he still has 600 dollars left and it does not expire until 07/08/22.  Advised he should request a refill of his medication the last week of September. Patient voiced understanding.

## 2022-06-21 DIAGNOSIS — H10502 Unspecified blepharoconjunctivitis, left eye: Secondary | ICD-10-CM | POA: Diagnosis not present

## 2022-06-24 ENCOUNTER — Other Ambulatory Visit: Payer: Self-pay | Admitting: Cardiovascular Disease

## 2022-06-30 DIAGNOSIS — M79671 Pain in right foot: Secondary | ICD-10-CM | POA: Diagnosis not present

## 2022-07-04 ENCOUNTER — Ambulatory Visit: Payer: Medicare Other | Attending: Cardiovascular Disease | Admitting: Cardiovascular Disease

## 2022-07-04 ENCOUNTER — Encounter: Payer: Self-pay | Admitting: Cardiovascular Disease

## 2022-07-04 VITALS — BP 106/70 | HR 56 | Ht 68.0 in | Wt 187.0 lb

## 2022-07-04 DIAGNOSIS — I739 Peripheral vascular disease, unspecified: Secondary | ICD-10-CM | POA: Insufficient documentation

## 2022-07-04 DIAGNOSIS — I350 Nonrheumatic aortic (valve) stenosis: Secondary | ICD-10-CM | POA: Diagnosis not present

## 2022-07-04 DIAGNOSIS — R600 Localized edema: Secondary | ICD-10-CM | POA: Insufficient documentation

## 2022-07-04 DIAGNOSIS — E782 Mixed hyperlipidemia: Secondary | ICD-10-CM | POA: Insufficient documentation

## 2022-07-04 DIAGNOSIS — I10 Essential (primary) hypertension: Secondary | ICD-10-CM | POA: Insufficient documentation

## 2022-07-04 DIAGNOSIS — I2510 Atherosclerotic heart disease of native coronary artery without angina pectoris: Secondary | ICD-10-CM | POA: Diagnosis not present

## 2022-07-04 DIAGNOSIS — I6523 Occlusion and stenosis of bilateral carotid arteries: Secondary | ICD-10-CM | POA: Diagnosis not present

## 2022-07-04 NOTE — Assessment & Plan Note (Signed)
History of moderate aortic stenosis with 2D echo performed 12/30/2021 revealing normal LV systolic function with grade 2 diastolic dysfunction and a aortic valve area of 0.89 cm with a peak gradient of 44 mmHg.  He does complain of some dyspnea but this has not changed in frequency or severity.  I am going to recheck an echo on an annual basis.

## 2022-07-04 NOTE — Progress Notes (Signed)
07/04/2022 JAKING THAYER   04/24/1936  [id number]  Primary Physician Shon Baton, MD Primary Cardiologist: Lorretta Harp MD Lupe Carney, [place]  HPI:  Eric [name] is a 86 y.o.  mildly to moderately overweight married Caucasian male father of 37, grandfather to 8 grandchildren who is accompanied by his wife Eric [name] today as usual. I last saw him in the office 08/05/21.Marland Kitchen He has a history of CAD and PVOD. I stented his LAD and diagonal branch back on July 14, 2003 and subsequent stenting by Dr. Melvern Banker July 31, 2003. He had mild bilateral renal artery stenosis at cath with blockages in the 40% range as well as moderate right internal carotid artery stenosis by duplex ultrasound. He was neurologic asymptomatic. His other problems include hypertension and hyperlipidemia. He has obstructive sleep apnea followed by Dr. Claiborne Billings. His most recent lab work revealed a total cholesterol of 125, LDL of 58 and HDL of 36. Carotid Dopplers performed in September 2016 revealed moderate right ICA stenosis, unchanged from a prior study and renal Dopplers performed 01/05/16 showed progression of bilateral renal artery stenosis. CT angiography performed 02/04/16 that showed high-grade ostial left renal artery stenosis with probable right renal artery stenosis as well. The patient underwent left renal artery PTA and stenting using a 6 mm x 15 mm long balloon expandable stent with excellent angiographic result. His follow-up Dopplers performed on 10/16/16 showed marked improvement in his velocities and renal aortic ratios. Since I saw me regarding his remaining currently stable. He does have moderate aortic stenosis by 2-D echo performed 10/15/2017 the valve area 1 cm and a peak gradient of 34 mmHg.     Recent noninvasive tests show stable right ICA stenosis, patent left renal artery stent with moderate right renal artery stenosis and non-compressible vessels not allowing the determination of ABIs.  He does complain  of pain in his hips and legs which does not sound like claudication.  It may be statin related and we talked about starting a statin holiday for 3 months.   Since stopping his high-dose statin  his lower extremity pain and hip pain has significantly improved suggesting this was a statin intolerance.  As result, we did start Praluent which has improved his lipid profile.  Unfortunately, his insurance company no longer covers this and he was unable to complete the medication assistance paperwork.  2D echo performed 10/05/2017 revealed normal LV function with a valve area of 1.1 cm and a peak gradient of 31 mmHg although the patient is asymptomatic from this.   Since I saw him in the office 6 months ago he is remained stable.  He does complain of some numbness and weakness in his left leg although his Dopplers do not support a diagnosis of PAD as a etiology.  He does complain of some dyspnea but this has not changed in severity.  2D echo performed 12/30/2021 revealed normal LV systolic function with grade 2 diastolic dysfunction and an aortic valve area of 0.89 cm with a peak gradient of 45 mmHg.   Current Meds  Medication Sig   Alirocumab (PRALUENT) 150 MG/ML SOAJ INJECT 150 MG INTO THE SKIN EVERY 14 (FOURTEEN) DAYS.   aspirin 81 MG tablet Take 81 mg by mouth daily.   clopidogrel (PLAVIX) 75 MG tablet TAKE 1 TABLET BY MOUTH EVERY DAY   diazepam (VALIUM) 5 MG tablet TAKE 1 TABLET EVERY 8 HOURS AS NEEDED   ezetimibe (ZETIA) 10 MG tablet TAKE 1 TABLET BY  MOUTH DAILY   fluticasone (FLONASE) 50 MCG/ACT nasal spray Place 2 sprays into both nostrils daily.   furosemide (LASIX) 20 MG tablet Take 80 mg by mouth as needed. Take 4 tablets on Monday, Weds, Friday & Sat   losartan (COZAAR) 100 MG tablet Take 100 mg by mouth daily.   meclizine (ANTIVERT) 25 MG tablet TAKE ONE TABLET BY MOUTH EVERY 8 HOURS AS NEEDED FOR DIZZINESS   metoprolol tartrate (LOPRESSOR) 50 MG tablet TAKE 1.5 TABLETS BY MOUTH TWICE DAILY    nitroGLYCERIN (NITROSTAT) 0.4 MG SL tablet Place 1 tablet (0.4 mg total) under the tongue every 5 (five) minutes as needed for chest pain.   pantoprazole (PROTONIX) 40 MG tablet Take 1 tablet (40 mg total) by mouth daily.     Allergies  Allergen Reactions   Niaspan [Niacin Er]    Sertraline Hcl     REACTION: disorientation   Tetanus Toxoids     Social History   Socioeconomic History   Marital status: Married    Spouse name: ruth   Number of children: 4   Years of education: Not on file   Highest education level: Not on file  Occupational History   Occupation: Retired  Tobacco Use   Smoking status: Former    Packs/day: 1.00    Years: 15.00    Total pack years: 15.00    Types: Cigarettes    Quit date: 10/02/1964    Years since quitting: 57.7   Smokeless tobacco: Never  Substance and Sexual Activity   Alcohol use: Yes    Comment: 03/20/2016 "haven't had a drink since the 1990s"   Drug use: No   Sexual activity: Not Currently  Other Topics Concern   Not on file  Social History Narrative   Not on file   Social Determinants of Health   Financial Resource Strain: Not on file  Food Insecurity: Not on file  Transportation Needs: Not on file  Physical Activity: Not on file  Stress: Not on file  Social Connections: Not on file  Intimate Partner Violence: Not on file     Review of Systems: General: negative for chills, fever, night sweats or weight changes.  Cardiovascular: negative for chest pain, dyspnea on exertion, edema, orthopnea, palpitations, paroxysmal nocturnal dyspnea or shortness of breath Dermatological: negative for rash Respiratory: negative for cough or wheezing Urologic: negative for hematuria Abdominal: negative for nausea, vomiting, diarrhea, bright red blood per rectum, melena, or hematemesis Neurologic: negative for visual changes, syncope, or dizziness All other systems reviewed and are otherwise negative except as noted above.    Blood pressure  106/70, pulse (!) 56, height '5\' 8"'$  (1.727 m), weight 187 lb (84.8 kg), SpO2 95 %.  General appearance: alert and no distress Neck: no adenopathy, no JVD, supple, symmetrical, trachea midline, thyroid not enlarged, symmetric, no tenderness/mass/nodules, and bilateral carotid bruits versus transmitted murmur Lungs: clear to auscultation bilaterally Heart: 2/6 high-pitched outflow tract murmur consistent with aortic stenosis. Extremities: extremities normal, atraumatic, no cyanosis or edema Pulses: Diminished pedal pulses Skin: Skin color, texture, turgor normal. No rashes or lesions Neurologic: Grossly normal  EKG not performed today  ASSESSMENT AND PLAN:   Hyperlipidemia History of hyperlipidemia on Praluent and Zetia with lipid profile performed 05/08/2022 revealing total cholesterol 116, LDL 35 and HDL of 52.  Essential hypertension History of essential hypertension a blood pressure measured today at 106/70.  He is on losartan and metoprolol.  Coronary atherosclerosis History of CAD status post stenting of the LAD  and diagonal branch July 14, 2003 by Dr. Melvern Banker.  He has had no further coronary intervention since that time.  Peripheral vascular disease ([place]) History of peripheral vascular disease status post left renal artery stenting by myself 01/6999 2017.  His most recent Doppler studies performed 12/30/2021 were stable with a right renal aortic ratio of 5.42.  Renal dimensions have remained remained stable as well.  Blood pressures been under good control.  He also had lower extremity arterial Doppler studies performed 05/26/2022 revealing an occluded right popliteal artery with mild to moderate left popliteal artery stenosis.  He does complain of left leg weakness and numbness which sounds neurogenic as opposed to peripheral vascular.  Bilateral lower extremity edema Bilateral lower extremity edema on furosemide which has proven to be effective.  Aortic stenosis, moderate History  of moderate aortic stenosis with 2D echo performed 12/30/2021 revealing normal LV systolic function with grade 2 diastolic dysfunction and a aortic valve area of 0.89 cm with a peak gradient of 44 mmHg.  He does complain of some dyspnea but this has not changed in frequency or severity.  I am going to recheck an echo on an annual basis.     Lorretta Harp MD FACP,FACC,FAHA, [facility] 07/04/2022 1:56 PM

## 2022-07-04 NOTE — Assessment & Plan Note (Addendum)
History of peripheral vascular disease status post left renal artery stenting by myself 01/6999 2017.  His most recent Doppler studies performed 12/30/2021 were stable with a right renal aortic ratio of 5.42.  Renal dimensions have remained remained stable as well.  Blood pressures been under good control.  He also had lower extremity arterial Doppler studies performed 05/26/2022 revealing an occluded right popliteal artery with mild to moderate left popliteal artery stenosis.  He does complain of left leg weakness and numbness which sounds neurogenic as opposed to peripheral vascular.

## 2022-07-04 NOTE — Assessment & Plan Note (Signed)
Bilateral lower extremity edema on furosemide which has proven to be effective.

## 2022-07-04 NOTE — Patient Instructions (Signed)
   Testing/Procedures:  Your physician has requested that you have an echocardiogram. Echocardiography is a painless test that uses sound waves to create images of your heart. It provides your doctor with information about the size and shape of your heart and how well your heart's chambers and valves are working. This procedure takes approximately one hour. There are no restrictions for this procedure. [place] MARCH 2024   Follow-Up: At [facility], you and your health needs are our priority.  As part of our continuing mission to provide you with exceptional heart care, we have created designated Provider Care Teams.  These Care Teams include your primary Cardiologist (physician) and Advanced Practice Providers (APPs -  Physician Assistants and Nurse Practitioners) who all work together to provide you with the care you need, when you need it.  We recommend signing up for the patient portal called "MyChart".  Sign up information is provided on this After Visit Summary.  MyChart is used to connect with patients for Virtual Visits (Telemedicine).  Patients are able to view lab/test results, encounter notes, upcoming appointments, etc.  Non-urgent messages can be sent to your provider as well.   To learn more about what you can do with MyChart, go to [email or web address].    Your next appointment:   6 month(s)  The format for your next appointment:   In Person  Provider:   Fabian Sharp, PA-C    Then, Quay Burow, MD will plan to see you again in 12 month(s).

## 2022-07-04 NOTE — Assessment & Plan Note (Signed)
History of essential hypertension a blood pressure measured today at 106/70.  He is on losartan and metoprolol.

## 2022-07-04 NOTE — Assessment & Plan Note (Signed)
History of hyperlipidemia on Praluent and Zetia with lipid profile performed 05/08/2022 revealing total cholesterol 116, LDL 35 and HDL of 52.

## 2022-07-04 NOTE — Assessment & Plan Note (Signed)
History of CAD status post stenting of the LAD and diagonal branch July 14, 2003 by Dr. Melvern Banker.  He has had no further coronary intervention since that time.

## 2022-07-08 DIAGNOSIS — Z23 Encounter for immunization: Secondary | ICD-10-CM | POA: Diagnosis not present

## 2022-08-01 DIAGNOSIS — H04123 Dry eye syndrome of bilateral lacrimal glands: Secondary | ICD-10-CM | POA: Diagnosis not present

## 2022-08-07 DIAGNOSIS — I129 Hypertensive chronic kidney disease with stage 1 through stage 4 chronic kidney disease, or unspecified chronic kidney disease: Secondary | ICD-10-CM | POA: Diagnosis not present

## 2022-08-07 DIAGNOSIS — H66001 Acute suppurative otitis media without spontaneous rupture of ear drum, right ear: Secondary | ICD-10-CM | POA: Diagnosis not present

## 2022-08-07 DIAGNOSIS — Z1152 Encounter for screening for COVID-19: Secondary | ICD-10-CM | POA: Diagnosis not present

## 2022-08-07 DIAGNOSIS — H9201 Otalgia, right ear: Secondary | ICD-10-CM | POA: Diagnosis not present

## 2022-08-07 DIAGNOSIS — J029 Acute pharyngitis, unspecified: Secondary | ICD-10-CM | POA: Diagnosis not present

## 2022-08-07 DIAGNOSIS — R5383 Other fatigue: Secondary | ICD-10-CM | POA: Diagnosis not present

## 2022-08-07 DIAGNOSIS — N1831 Chronic kidney disease, stage 3a: Secondary | ICD-10-CM | POA: Diagnosis not present

## 2022-08-09 ENCOUNTER — Ambulatory Visit: Payer: Medicare Other | Admitting: Cardiovascular Disease

## 2022-08-16 DIAGNOSIS — M5451 Vertebrogenic low back pain: Secondary | ICD-10-CM | POA: Diagnosis not present

## 2022-08-16 DIAGNOSIS — M79605 Pain in left leg: Secondary | ICD-10-CM | POA: Diagnosis not present

## 2022-08-16 DIAGNOSIS — M25562 Pain in left knee: Secondary | ICD-10-CM | POA: Diagnosis not present

## 2022-10-31 DIAGNOSIS — Z85828 Personal history of other malignant neoplasm of skin: Secondary | ICD-10-CM | POA: Diagnosis not present

## 2022-10-31 DIAGNOSIS — L309 Dermatitis, unspecified: Secondary | ICD-10-CM | POA: Diagnosis not present

## 2022-10-31 DIAGNOSIS — L0889 Other specified local infections of the skin and subcutaneous tissue: Secondary | ICD-10-CM | POA: Diagnosis not present

## 2022-10-31 DIAGNOSIS — L03115 Cellulitis of right lower limb: Secondary | ICD-10-CM | POA: Diagnosis not present

## 2022-10-31 DIAGNOSIS — L603 Nail dystrophy: Secondary | ICD-10-CM | POA: Diagnosis not present

## 2022-11-03 DIAGNOSIS — M48062 Spinal stenosis, lumbar region with neurogenic claudication: Secondary | ICD-10-CM | POA: Diagnosis not present

## 2022-11-03 DIAGNOSIS — M47896 Other spondylosis, lumbar region: Secondary | ICD-10-CM | POA: Diagnosis not present

## 2022-11-07 DIAGNOSIS — Z85828 Personal history of other malignant neoplasm of skin: Secondary | ICD-10-CM | POA: Diagnosis not present

## 2022-11-07 DIAGNOSIS — L603 Nail dystrophy: Secondary | ICD-10-CM | POA: Diagnosis not present

## 2022-11-07 DIAGNOSIS — L03115 Cellulitis of right lower limb: Secondary | ICD-10-CM | POA: Diagnosis not present

## 2022-11-20 DIAGNOSIS — M5451 Vertebrogenic low back pain: Secondary | ICD-10-CM | POA: Diagnosis not present

## 2022-11-20 DIAGNOSIS — M545 Low back pain, unspecified: Secondary | ICD-10-CM | POA: Diagnosis not present

## 2022-11-21 DIAGNOSIS — L603 Nail dystrophy: Secondary | ICD-10-CM | POA: Diagnosis not present

## 2022-11-21 DIAGNOSIS — Z85828 Personal history of other malignant neoplasm of skin: Secondary | ICD-10-CM | POA: Diagnosis not present

## 2022-11-21 DIAGNOSIS — L600 Ingrowing nail: Secondary | ICD-10-CM | POA: Diagnosis not present

## 2022-11-27 DIAGNOSIS — M48062 Spinal stenosis, lumbar region with neurogenic claudication: Secondary | ICD-10-CM | POA: Diagnosis not present

## 2022-11-27 DIAGNOSIS — M47896 Other spondylosis, lumbar region: Secondary | ICD-10-CM | POA: Diagnosis not present

## 2022-12-01 ENCOUNTER — Telehealth: Payer: Self-pay

## 2022-12-01 NOTE — Telephone Encounter (Signed)
   Pre-operative Risk Assessment    Patient Name: Eric [name]  DOB: 1936-02-07 MRN: [id number]      Request for Surgical Clearance    Procedure:   Lumbar Selective Nerve Root block   Date of Surgery:  Clearance 12/12/22                                 Surgeon:   Surgeon's Group or Practice Name:  [facility] Phone number:  [phone] ext [phone] Fax number:  [phone]   Type of Clearance Requested:   - Medical  - Pharmacy:  Hold Clopidogrel (Plavix) 7 days prior    Type of Anesthesia:  Not Indicated   Additional requests/questions:    [name]   12/01/2022, 5:30 PM

## 2022-12-04 ENCOUNTER — Ambulatory Visit (HOSPITAL_COMMUNITY): Payer: Medicare Other | Attending: Cardiovascular Disease

## 2022-12-04 DIAGNOSIS — I350 Nonrheumatic aortic (valve) stenosis: Secondary | ICD-10-CM | POA: Insufficient documentation

## 2022-12-04 LAB — ECHOCARDIOGRAM COMPLETE
AR max vel: 1.05 cm2
AV Area VTI: 1.03 cm2
AV Area mean vel: 1.15 cm2
AV Mean grad: 29.7 mmHg
AV Peak grad: 50.8 mmHg
Ao pk vel: 3.56 m/s
Area-P 1/2: 2.32 cm2
MV VTI: 1.48 cm2
P 1/2 time: 332 msec
S' Lateral: 2.5 cm

## 2022-12-04 NOTE — Telephone Encounter (Signed)
Eric [name] 87 year old male is requesting preoperative cardiac evaluation for lumbar selective nerve root block.  He underwent left renal artery PTA and stenting and 2017.  He was last seen in the clinic on 07/04/2022.  He remained stable from a cardiac standpoint.  He did note some numbness and weakness in his left lower extremity which did not appear to be related to PAD.  He did note dyspnea which was chronic and unchanged.  His echocardiogram 12/30/2021 showed normal LV function, G2 DD and aortic valve area of 0.89 cm with a peak gradient of 45 mmHg.  May his Plavix be held prior to his procedure?  Thank you for your help.  Please direct your response to CV DIV preop pool.  [name]. [name] NP-C     12/04/2022, 12:02 PM [place] [address] Office [phone] Fax [phone]

## 2022-12-05 NOTE — Telephone Encounter (Signed)
For lumbar nerve root block, I would hold Plavix for at least 7 days.  Then, would not restart for 2 to 3 days post procedure.  Glenetta Hew, MD

## 2022-12-06 ENCOUNTER — Telehealth: Payer: Self-pay | Admitting: *Deleted

## 2022-12-06 ENCOUNTER — Ambulatory Visit: Payer: Medicare Other | Attending: General Practice

## 2022-12-06 DIAGNOSIS — Z01810 Encounter for preprocedural cardiovascular examination: Secondary | ICD-10-CM | POA: Diagnosis not present

## 2022-12-06 NOTE — Telephone Encounter (Signed)
I s/w the pt and added him on to today 3:20 for pre op tele appt , due to procedure date and med hold.  Pt tells me that he did not take his Plavix yesterday 3/5. I did advise pt to continue to hold Plavix until after his procedure. Med rec and consent are done.    Patient Consent for Virtual Visit        Eric [name] has provided verbal consent on 12/06/2022 for a virtual visit (video or telephone).   CONSENT FOR VIRTUAL VISIT FOR:  Eric [name]  By participating in this virtual visit I agree to the following:  I hereby voluntarily request, consent and authorize [facility] and its employed or contracted physicians, physician assistants, nurse practitioners or other licensed health care professionals (the Practitioner), to provide me with telemedicine health care services (the "Services") as deemed necessary by the treating Practitioner. I acknowledge and consent to receive the Services by the Practitioner via telemedicine. I understand that the telemedicine visit will involve communicating with the Practitioner through live audiovisual communication technology and the disclosure of certain medical information by electronic transmission. I acknowledge that I have been given the opportunity to request an in-person assessment or other available alternative prior to the telemedicine visit and am voluntarily participating in the telemedicine visit.  I understand that I have the right to withhold or withdraw my consent to the use of telemedicine in the course of my care at any time, without affecting my right to future care or treatment, and that the Practitioner or I may terminate the telemedicine visit at any time. I understand that I have the right to inspect all information obtained and/or recorded in the course of the telemedicine visit and may receive copies of available information for a reasonable fee.  I understand that some of the potential risks of receiving the Services via  telemedicine include:  Delay or interruption in medical evaluation due to technological equipment failure or disruption; Information transmitted may not be sufficient (e.g. poor resolution of images) to allow for appropriate medical decision making by the Practitioner; and/or  In rare instances, security protocols could fail, causing a breach of personal health information.  Furthermore, I acknowledge that it is my responsibility to provide information about my medical history, conditions and care that is complete and accurate to the best of my ability. I acknowledge that Practitioner's advice, recommendations, and/or decision may be based on factors not within their control, such as incomplete or inaccurate data provided by me or distortions of diagnostic images or specimens that may result from electronic transmissions. I understand that the practice of medicine is not an exact science and that Practitioner makes no warranties or guarantees regarding treatment outcomes. I acknowledge that a copy of this consent can be made available to me via my patient portal ([place]), or I can request a printed copy by calling the office of [facility].    I understand that my insurance will be billed for this visit.   I have read or had this consent read to me. I understand the contents of this consent, which adequately explains the benefits and risks of the Services being provided via telemedicine.  I have been provided ample opportunity to ask questions regarding this consent and the Services and have had my questions answered to my satisfaction. I give my informed consent for the services to be provided through the use of telemedicine in my medical care

## 2022-12-06 NOTE — Progress Notes (Signed)
Virtual Visit via Telephone Note   Because of [name]'s co-morbid illnesses, he is at least at moderate risk for complications without adequate follow up.  This format is felt to be most appropriate for this patient at this time.  The patient did not have access to video technology/had technical difficulties with video requiring transitioning to audio format only (telephone).  All issues noted in this document were discussed and addressed.  No physical exam could be performed with this format.  Please refer to the patient's chart for his consent to telehealth for [name]  Evaluation Performed:  Preoperative cardiovascular risk assessment _____________   Date:  12/06/2022   Patient ID:  Eric [name], DOB [birth year], MRN [id number] Patient Location:  Home Provider location:   Office  Primary Care Provider:  Shon Baton, MD Primary Cardiologist:  Quay Burow, MD  Chief Complaint / Patient Profile   87 y.o. y/o male with a h/o aortic stenosis, HLD, HTN, peripheral vascular disease who is pending lumbar selective nerve root block and presents today for telephonic preoperative cardiovascular risk assessment.  History of Present Illness    Eric [name] is a 87 y.o. male who presents via audio/video conferencing for a telehealth visit today.  Pt was last seen in cardiology clinic on 07/04/2022 by Dr.Berry.  At that time Eric [name] was doing well .  The patient is now pending procedure as outlined above. Since his last visit, he remains stable from a cardiac standpoint.  Today he denies chest pain, shortness of breath, lower extremity edema, fatigue, palpitations, melena, hematuria, hemoptysis, diaphoresis, weakness, presyncope, syncope, orthopnea, and PND.   Past Medical History    Past Medical History:  Diagnosis Date   Anxiety state, unspecified    Arthritis    "hands" (03/20/2016)   Benign neoplasm of colon    Bilateral lower extremity edema    CAD  (coronary artery disease)    a. 2004: PCI of LAD and D1   Calculus of kidney    Cerebrovascular disease, unspecified    Heart murmur    Hyperlipidemia    Irritable bowel syndrome    Meniere's disease    Myalgia and myositis, unspecified    Myocardial infarction ([place]) 2001   OSA (obstructive sleep apnea)    "had mask; wouldn't wear it" (03/20/2016)   Peripheral vascular disease, unspecified ([place])    Renal artery stenosis ([place])    a. 03/2016: s/p PTA and stenting of 90% stenosis of L renal artery with 0% residual stenosis   Right-sided carotid artery disease ([facility])    Unspecified essential hypertension    Unspecified hearing loss    Past Surgical History:  Procedure Laterality Date   CARDIAC CATHETERIZATION     CORONARY ANGIOPLASTY     CORONARY ANGIOPLASTY WITH STENT PLACEMENT     "1 + 2"   CYSTOSCOPY WITH URETEROSCOPY, STONE BASKETRY AND STENT PLACEMENT     NM MYOVIEW LTD  [id number]   post stress ejection fraction is 73% normal myocardial perfusion study, low risk scan   PERIPHERAL VASCULAR CATHETERIZATION N/A 03/20/2016   Procedure: Renal Angiography;  Surgeon: Lorretta Harp, MD;  Location: [facility];  Service: Cardiovascular;  Laterality: N/A;   RENAL ARTERY STENT  03/20/2016   TONSILLECTOMY  ~ 1945   TYMPANOSTOMY TUBE PLACEMENT Right    "for Meniere's"    Allergies  Allergies  Allergen Reactions   Niaspan [Niacin Er]    Sertraline Hcl  REACTION: disorientation   Tetanus Toxoids     Home Medications    Prior to Admission medications   Medication Sig Start Date End Date Taking? Authorizing Provider  Alirocumab (PRALUENT) 150 MG/ML SOAJ INJECT 150 MG INTO THE SKIN EVERY 14 (FOURTEEN) DAYS. 04/03/22   Lorretta Harp, MD  aspirin 81 MG tablet Take 81 mg by mouth daily.    [provider]  clopidogrel (PLAVIX) 75 MG tablet TAKE 1 TABLET BY MOUTH EVERY DAY 06/26/22   Lorretta Harp, MD  diazepam (VALIUM) 5 MG tablet TAKE 1 TABLET EVERY 8 HOURS AS  NEEDED 01/28/14   Noralee Space, MD  ezetimibe (ZETIA) 10 MG tablet TAKE 1 TABLET BY MOUTH DAILY 08/08/17   Lorretta Harp, MD  fluticasone (FLONASE) 50 MCG/ACT nasal spray Place 2 sprays into both nostrils daily. 03/03/17   [provider]  furosemide (LASIX) 20 MG tablet Take 80 mg by mouth as needed. Take 4 tablets on Monday, Weds, Friday & Sat    [provider]  losartan (COZAAR) 100 MG tablet Take 100 mg by mouth daily. 04/05/16   [provider]  meclizine (ANTIVERT) 25 MG tablet TAKE ONE TABLET BY MOUTH EVERY 8 HOURS AS NEEDED FOR DIZZINESS 01/07/14   Noralee Space, MD  metoprolol tartrate (LOPRESSOR) 50 MG tablet TAKE 1.5 TABLETS BY MOUTH TWICE DAILY 09/20/17   Lorretta Harp, MD  nitroGLYCERIN (NITROSTAT) 0.4 MG SL tablet Place 1 tablet (0.4 mg total) under the tongue every 5 (five) minutes as needed for chest pain. 05/22/14   Lyda Jester M, PA-C  pantoprazole (PROTONIX) 40 MG tablet Take 1 tablet (40 mg total) by mouth daily. 06/11/17   Lorretta Harp, MD    Physical Exam    Vital Signs:  [name] does not have vital signs available for review today.  Given telephonic nature of communication, physical exam is limited. AAOx3. NAD. Normal affect.  Speech and respirations are unlabored.  Accessory Clinical Findings    None  Assessment & Plan    1.  Preoperative Cardiovascular Risk Assessment: Lumbar selective nerve root block, [facility]    Primary Cardiologist: Quay Burow, MD  Chart reviewed as part of pre-operative protocol coverage. Given past medical history and time since last visit, based on ACC/AHA guidelines, Eric [name] would be at acceptable risk for the planned procedure without further cardiovascular testing.   Per Dr. Ellyn Hack- "For lumbar nerve root block, I would hold Plavix for at least 7 days. Then, would not restart for 2 to 3 days post procedure. "  The patient was advised that if he develops new  symptoms prior to surgery to contact our office to arrange for a follow-up visit, and he verbalized understanding.  I will route this recommendation to the requesting party via Epic fax function and remove from pre-op pool.       Time:   Today, I have spent 5 minutes with the patient with telehealth technology discussing medical history, symptoms, and management plan.  Prior to his phone evaluation I spent greater than 10 minutes reviewing his past medical history and cardiac medications.   Deberah Pelton, NP  12/06/2022, 8:23 AM

## 2022-12-06 NOTE — Telephone Encounter (Signed)
I s/w the pt and added him on to today 3:20 for pre op tele appt , due to procedure date and med hold.  Pt tells me that he did not take his Plavix yesterday 3/5. I did advise pt to continue to hold Plavix until after his procedure. Med rec and consent are done.        Patient Consent for Virtual Visit        Eric [name] has provided verbal consent on 12/06/2022 for a virtual visit (video or telephone).   CONSENT FOR VIRTUAL VISIT FOR:  Eric [name]  By participating in this virtual visit I agree to the following:  I hereby voluntarily request, consent and authorize [facility] and its employed or contracted physicians, physician assistants, nurse practitioners or other licensed health care professionals (the Practitioner), to provide me with telemedicine health care services (the "Services") as deemed necessary by the treating Practitioner. I acknowledge and consent to receive the Services by the Practitioner via telemedicine. I understand that the telemedicine visit will involve communicating with the Practitioner through live audiovisual communication technology and the disclosure of certain medical information by electronic transmission. I acknowledge that I have been given the opportunity to request an in-person assessment or other available alternative prior to the telemedicine visit and am voluntarily participating in the telemedicine visit.  I understand that I have the right to withhold or withdraw my consent to the use of telemedicine in the course of my care at any time, without affecting my right to future care or treatment, and that the Practitioner or I may terminate the telemedicine visit at any time. I understand that I have the right to inspect all information obtained and/or recorded in the course of the telemedicine visit and may receive copies of available information for a reasonable fee.  I understand that some of the potential risks of receiving the Services via  telemedicine include:  Delay or interruption in medical evaluation due to technological equipment failure or disruption; Information transmitted may not be sufficient (e.g. poor resolution of images) to allow for appropriate medical decision making by the Practitioner; and/or  In rare instances, security protocols could fail, causing a breach of personal health information.  Furthermore, I acknowledge that it is my responsibility to provide information about my medical history, conditions and care that is complete and accurate to the best of my ability. I acknowledge that Practitioner's advice, recommendations, and/or decision may be based on factors not within their control, such as incomplete or inaccurate data provided by me or distortions of diagnostic images or specimens that may result from electronic transmissions. I understand that the practice of medicine is not an exact science and that Practitioner makes no warranties or guarantees regarding treatment outcomes. I acknowledge that a copy of this consent can be made available to me via my patient portal ([facility]), or I can request a printed copy by calling the office of [facility].    I understand that my insurance will be billed for this visit.   I have read or had this consent read to me. I understand the contents of this consent, which adequately explains the benefits and risks of the Services being provided via telemedicine.  I have been provided ample opportunity to ask questions regarding this consent and the Services and have had my questions answered to my satisfaction. I give my informed consent for the services to be provided through the use of telemedicine in my  medical care

## 2022-12-06 NOTE — Telephone Encounter (Signed)
   Name: Eric [name]  DOB: 1935-12-05  MRN: [id number]  Primary Cardiologist: Quay Burow, MD   Preoperative team, please contact this patient and set up a phone call appointment for further preoperative risk assessment. Please obtain consent and complete medication review. Thank you for your help.  I confirm that guidance regarding antiplatelet and oral anticoagulation therapy has been completed and, if necessary, noted below.  Per Dr. Ellyn Hack- For lumbar nerve root block, I would hold Plavix for at least 7 days. Then, would not restart for 2 to 3 days post procedure.    Deberah Pelton, NP 12/06/2022, 7:05 AM [place]

## 2022-12-07 DIAGNOSIS — L570 Actinic keratosis: Secondary | ICD-10-CM | POA: Diagnosis not present

## 2022-12-07 DIAGNOSIS — L821 Other seborrheic keratosis: Secondary | ICD-10-CM | POA: Diagnosis not present

## 2022-12-07 DIAGNOSIS — L814 Other melanin hyperpigmentation: Secondary | ICD-10-CM | POA: Diagnosis not present

## 2022-12-07 DIAGNOSIS — Z85828 Personal history of other malignant neoplasm of skin: Secondary | ICD-10-CM | POA: Diagnosis not present

## 2022-12-12 DIAGNOSIS — M5416 Radiculopathy, lumbar region: Secondary | ICD-10-CM | POA: Diagnosis not present

## 2022-12-29 DIAGNOSIS — M47816 Spondylosis without myelopathy or radiculopathy, lumbar region: Secondary | ICD-10-CM | POA: Diagnosis not present

## 2022-12-29 DIAGNOSIS — M48062 Spinal stenosis, lumbar region with neurogenic claudication: Secondary | ICD-10-CM | POA: Diagnosis not present

## 2023-01-16 DIAGNOSIS — M47896 Other spondylosis, lumbar region: Secondary | ICD-10-CM | POA: Diagnosis not present

## 2023-01-25 ENCOUNTER — Ambulatory Visit: Payer: Medicare Other | Admitting: Physician Assistant

## 2023-01-30 DIAGNOSIS — M47896 Other spondylosis, lumbar region: Secondary | ICD-10-CM | POA: Diagnosis not present

## 2023-02-02 ENCOUNTER — Ambulatory Visit (HOSPITAL_BASED_OUTPATIENT_CLINIC_OR_DEPARTMENT_OTHER)
Admission: RE | Admit: 2023-02-02 | Discharge: 2023-02-02 | Disposition: A | Discharge status: Home / Self Care | Payer: Medicare Other | Source: Ambulatory Visit | Attending: Physician Assistant | Admitting: Physician Assistant

## 2023-02-02 ENCOUNTER — Ambulatory Visit (HOSPITAL_COMMUNITY)
Admission: RE | Admit: 2023-02-02 | Discharge: 2023-02-02 | Disposition: A | Discharge status: Home / Self Care | Payer: Medicare Other | Source: Ambulatory Visit | Attending: Physician Assistant | Admitting: Physician Assistant

## 2023-02-02 DIAGNOSIS — I701 Atherosclerosis of renal artery: Secondary | ICD-10-CM

## 2023-02-02 DIAGNOSIS — I739 Peripheral vascular disease, unspecified: Secondary | ICD-10-CM | POA: Insufficient documentation

## 2023-02-02 DIAGNOSIS — I6523 Occlusion and stenosis of bilateral carotid arteries: Secondary | ICD-10-CM

## 2023-02-12 DIAGNOSIS — H00024 Hordeolum internum left upper eyelid: Secondary | ICD-10-CM | POA: Diagnosis not present

## 2023-03-05 ENCOUNTER — Other Ambulatory Visit: Payer: Self-pay | Admitting: Cardiovascular Disease

## 2023-03-05 DIAGNOSIS — I2510 Atherosclerotic heart disease of native coronary artery without angina pectoris: Secondary | ICD-10-CM

## 2023-03-05 DIAGNOSIS — E782 Mixed hyperlipidemia: Secondary | ICD-10-CM

## 2023-03-15 DIAGNOSIS — R5383 Other fatigue: Secondary | ICD-10-CM | POA: Diagnosis not present

## 2023-03-15 DIAGNOSIS — J309 Allergic rhinitis, unspecified: Secondary | ICD-10-CM | POA: Diagnosis not present

## 2023-03-15 DIAGNOSIS — R058 Other specified cough: Secondary | ICD-10-CM | POA: Diagnosis not present

## 2023-06-01 DIAGNOSIS — N1831 Chronic kidney disease, stage 3a: Secondary | ICD-10-CM | POA: Diagnosis not present

## 2023-06-01 DIAGNOSIS — E1122 Type 2 diabetes mellitus with diabetic chronic kidney disease: Secondary | ICD-10-CM | POA: Diagnosis not present

## 2023-06-01 DIAGNOSIS — Z125 Encounter for screening for malignant neoplasm of prostate: Secondary | ICD-10-CM | POA: Diagnosis not present

## 2023-06-01 DIAGNOSIS — I129 Hypertensive chronic kidney disease with stage 1 through stage 4 chronic kidney disease, or unspecified chronic kidney disease: Secondary | ICD-10-CM | POA: Diagnosis not present

## 2023-06-01 DIAGNOSIS — E785 Hyperlipidemia, unspecified: Secondary | ICD-10-CM | POA: Diagnosis not present

## 2023-06-05 ENCOUNTER — Telehealth: Payer: Self-pay | Admitting: Cardiovascular Disease

## 2023-06-05 NOTE — Telephone Encounter (Signed)
error 

## 2023-06-08 DIAGNOSIS — R82998 Other abnormal findings in urine: Secondary | ICD-10-CM | POA: Diagnosis not present

## 2023-06-08 DIAGNOSIS — I6529 Occlusion and stenosis of unspecified carotid artery: Secondary | ICD-10-CM | POA: Diagnosis not present

## 2023-06-08 DIAGNOSIS — I129 Hypertensive chronic kidney disease with stage 1 through stage 4 chronic kidney disease, or unspecified chronic kidney disease: Secondary | ICD-10-CM | POA: Diagnosis not present

## 2023-06-08 DIAGNOSIS — Z23 Encounter for immunization: Secondary | ICD-10-CM | POA: Diagnosis not present

## 2023-06-08 DIAGNOSIS — E1122 Type 2 diabetes mellitus with diabetic chronic kidney disease: Secondary | ICD-10-CM | POA: Diagnosis not present

## 2023-06-08 DIAGNOSIS — I5189 Other ill-defined heart diseases: Secondary | ICD-10-CM | POA: Diagnosis not present

## 2023-06-08 DIAGNOSIS — I050 Rheumatic mitral stenosis: Secondary | ICD-10-CM | POA: Diagnosis not present

## 2023-06-08 DIAGNOSIS — F419 Anxiety disorder, unspecified: Secondary | ICD-10-CM | POA: Diagnosis not present

## 2023-06-08 DIAGNOSIS — Z0000 Encounter for general adult medical examination without abnormal findings: Secondary | ICD-10-CM | POA: Diagnosis not present

## 2023-06-08 DIAGNOSIS — I2510 Atherosclerotic heart disease of native coronary artery without angina pectoris: Secondary | ICD-10-CM | POA: Diagnosis not present

## 2023-06-08 DIAGNOSIS — N1831 Chronic kidney disease, stage 3a: Secondary | ICD-10-CM | POA: Diagnosis not present

## 2023-06-08 DIAGNOSIS — Z1331 Encounter for screening for depression: Secondary | ICD-10-CM | POA: Diagnosis not present

## 2023-06-08 DIAGNOSIS — R972 Elevated prostate specific antigen [PSA]: Secondary | ICD-10-CM | POA: Diagnosis not present

## 2023-06-08 DIAGNOSIS — I350 Nonrheumatic aortic (valve) stenosis: Secondary | ICD-10-CM | POA: Diagnosis not present

## 2023-06-08 DIAGNOSIS — I701 Atherosclerosis of renal artery: Secondary | ICD-10-CM | POA: Diagnosis not present

## 2023-06-09 ENCOUNTER — Other Ambulatory Visit: Payer: Self-pay | Admitting: Cardiovascular Disease

## 2023-06-11 DIAGNOSIS — H40013 Open angle with borderline findings, low risk, bilateral: Secondary | ICD-10-CM | POA: Diagnosis not present

## 2023-07-09 ENCOUNTER — Encounter: Payer: Self-pay | Admitting: Cardiovascular Disease

## 2023-07-09 ENCOUNTER — Ambulatory Visit: Payer: Medicare Other | Attending: Cardiovascular Disease | Admitting: Cardiovascular Disease

## 2023-07-09 VITALS — BP 136/82 | HR 55 | Ht 68.0 in | Wt 186.0 lb

## 2023-07-09 DIAGNOSIS — I701 Atherosclerosis of renal artery: Secondary | ICD-10-CM | POA: Diagnosis not present

## 2023-07-09 DIAGNOSIS — I739 Peripheral vascular disease, unspecified: Secondary | ICD-10-CM | POA: Diagnosis not present

## 2023-07-09 DIAGNOSIS — I6523 Occlusion and stenosis of bilateral carotid arteries: Secondary | ICD-10-CM | POA: Diagnosis not present

## 2023-07-09 DIAGNOSIS — I350 Nonrheumatic aortic (valve) stenosis: Secondary | ICD-10-CM | POA: Insufficient documentation

## 2023-07-09 DIAGNOSIS — E782 Mixed hyperlipidemia: Secondary | ICD-10-CM | POA: Diagnosis not present

## 2023-07-09 DIAGNOSIS — I2510 Atherosclerotic heart disease of native coronary artery without angina pectoris: Secondary | ICD-10-CM | POA: Diagnosis not present

## 2023-07-09 DIAGNOSIS — I10 Essential (primary) hypertension: Secondary | ICD-10-CM | POA: Diagnosis not present

## 2023-07-09 NOTE — Assessment & Plan Note (Signed)
History of essential hypertension with blood pressure measured today at 136/82.  He is on losartan and metoprolol.

## 2023-07-09 NOTE — Assessment & Plan Note (Signed)
History of peripheral arterial disease status post left renal artery stenting by myself using a 6 mm x 15 mm long balloon expandable stent after CTA performed 02/04/2016 showed high-grade ostial left renal artery stenosis and proximal right renal artery stenosis as well.  Renal Doppler studies performed 02/02/2023 show a patent left renal artery stent with stable right renal artery velocities.  This will be repeated on an annual basis.

## 2023-07-09 NOTE — Patient Instructions (Signed)
Medication Instructions:  Your physician recommends that you continue on your current medications as directed. Please refer to the Current Medication list given to you today.  *If you need a refill on your cardiac medications before your next appointment, please call your pharmacy*   Testing/Procedures: Your physician has requested that you have an echocardiogram. Echocardiography is a painless test that uses sound waves to create images of your heart. It provides your doctor with information about the size and shape of your heart and how well your heart's chambers and valves are working. This procedure takes approximately one hour. There are no restrictions for this procedure. Please do NOT wear cologne, perfume, aftershave, or lotions (deodorant is allowed). Please arrive 15 minutes prior to your appointment time. This will take place at [address] [place]. [address] **To be done in March 2025**   Your physician has requested that you have a carotid duplex. This test is an ultrasound of the carotid arteries in your neck. It looks at blood flow through these arteries that supply the brain with blood. Allow one hour for this exam. There are no restrictions or special instructions. This will take place at [address] [facility], [address]. **To be done in May 2025**   Your physician has requested that you have a renal artery duplex. During this test, an ultrasound is used to evaluate blood flow to the kidneys. Take your medications as you usually do. This will take place at [address] [facility], [address].  No food after 11PM the night before.  Water is OK. (Don't drink liquids if you have been instructed not to for ANOTHER test). Avoid foods that produce bowel gas, for 24 hours prior to exam (see below). No breakfast, no chewing gum, no smoking or carbonated beverages. Patient may take morning medications with water. Come in for test at least 15 minutes early to register. **To be done in May  2025**    Follow-Up: At [facility], you and your health needs are our priority.  As part of our continuing mission to provide you with exceptional heart care, we have created designated Provider Care Teams.  These Care Teams include your primary Cardiologist (physician) and Advanced Practice Providers (APPs -  Physician Assistants and Nurse Practitioners) who all work together to provide you with the care you need, when you need it.  We recommend signing up for the patient portal called "MyChart".  Sign up information is provided on this After Visit Summary.  MyChart is used to connect with patients for Virtual Visits (Telemedicine).  Patients are able to view lab/test results, encounter notes, upcoming appointments, etc.  Non-urgent messages can be sent to your provider as well.   To learn more about what you can do with MyChart, go to [email or web address].    Your next appointment:   6 month(s)  Provider:   Edd Fabian, FNP or Micah Flesher, PA-C      Then, Nanetta Batty, MD will plan to see you again in 12 month(s).

## 2023-07-09 NOTE — Progress Notes (Signed)
07/09/2023 Eric [name]   10/19/1935  [id number]  Primary Physician Creola Corn, MD Primary Cardiologist: Runell Gess MD Nicholes Calamity, [place]  HPI:  Eric [name] is a 87 y.o.  mildly to moderately overweight married Caucasian male father of 4, grandfather to 8 grandchildren who is accompanied by his wife Eric [name] today as usual. I last saw him in the office 07/04/2022.Marland Kitchen He has a history of CAD and PVOD. I stented his LAD and diagonal branch back on July 14, 2003 and subsequent stenting by Dr. Elsie Lincoln July 31, 2003. He had mild bilateral renal artery stenosis at cath with blockages in the 40% range as well as moderate right internal carotid artery stenosis by duplex ultrasound. He was neurologic asymptomatic. His other problems include hypertension and hyperlipidemia. He has obstructive sleep apnea followed by Dr. Tresa Endo. His most recent lab work revealed a total cholesterol of 125, LDL of 58 and HDL of 36. Carotid Dopplers performed in September 2016 revealed moderate right ICA stenosis, unchanged from a prior study and renal Dopplers performed 01/05/16 showed progression of bilateral renal artery stenosis. CT angiography performed 02/04/16 that showed high-grade ostial left renal artery stenosis with probable right renal artery stenosis as well. The patient underwent left renal artery PTA and stenting using a 6 mm x 15 mm long balloon expandable stent with excellent angiographic result. His follow-up Dopplers performed on 10/16/16 showed marked improvement in his velocities and renal aortic ratios. Since I saw me regarding his remaining currently stable. He does have moderate aortic stenosis by 2-D echo performed 10/15/2017 the valve area 1 cm and a peak gradient of 34 mmHg.     Recent noninvasive tests show stable right ICA stenosis, patent left renal artery stent with moderate right renal artery stenosis and non-compressible vessels not allowing the determination of ABIs.  He does complain  of pain in his hips and legs which does not sound like claudication.  It may be statin related and we talked about starting a statin holiday for 3 months.   Since stopping his high-dose statin  his lower extremity pain and hip pain has significantly improved suggesting this was a statin intolerance.  As result, we did start Praluent which has improved his lipid profile.  Unfortunately, his insurance company no longer covers this and he was unable to complete the medication assistance paperwork.  2D echo performed 10/05/2017 revealed normal LV function with a valve area of 1.1 cm and a peak gradient of 31 mmHg although the patient is asymptomatic from this.   Since I saw him in the office a year ago he has remained stable.  He does complain of some back and leg pain which apparently has been diagnosed with spinal stenosis but refused surgery.  He does get some mild effort angina which has remained stable.  His most recent 2D echo performed 3//24 revealed moderate aortic stenosis area 0.8 cm and a peak gradient of 50 mmHg.   Current Meds  Medication Sig   Alirocumab (PRALUENT) 150 MG/ML SOAJ INJECT 150 MG INTO THE SKIN EVERY 14 (FOURTEEN) DAYS.   aspirin 81 MG tablet Take 81 mg by mouth daily.   clopidogrel (PLAVIX) 75 MG tablet TAKE 1 TABLET BY MOUTH EVERY DAY   diazepam (VALIUM) 5 MG tablet TAKE 1 TABLET EVERY 8 HOURS AS NEEDED   ezetimibe (ZETIA) 10 MG tablet TAKE 1 TABLET BY MOUTH DAILY   fluticasone (FLONASE) 50 MCG/ACT nasal spray Place 2 sprays into both  nostrils daily.   furosemide (LASIX) 20 MG tablet Take 80 mg by mouth as needed. Take 4 tablets on Monday, Weds, Friday & Sat   losartan (COZAAR) 100 MG tablet Take 100 mg by mouth daily.   meclizine (ANTIVERT) 25 MG tablet TAKE ONE TABLET BY MOUTH EVERY 8 HOURS AS NEEDED FOR DIZZINESS   metoprolol tartrate (LOPRESSOR) 50 MG tablet TAKE 1.5 TABLETS BY MOUTH TWICE DAILY   nitroGLYCERIN (NITROSTAT) 0.4 MG SL tablet Place 1 tablet (0.4 mg total)  under the tongue every 5 (five) minutes as needed for chest pain.   pantoprazole (PROTONIX) 40 MG tablet Take 1 tablet (40 mg total) by mouth daily. (Patient taking differently: Take 40 mg by mouth 2 (two) times daily.)     Allergies  Allergen Reactions   Niaspan [Niacin Er]    Sertraline Hcl     REACTION: disorientation   Tetanus Toxoids     Social History   Socioeconomic History   Marital status: Married    Spouse name: ruth   Number of children: 4   Years of education: Not on file   Highest education level: Not on file  Occupational History   Occupation: Retired  Tobacco Use   Smoking status: Former    Current packs/day: 0.00    Average packs/day: 1 pack/day for 15.0 years (15.0 ttl pk-yrs)    Types: Cigarettes    Start date: 10/02/1949    Quit date: 10/02/1964    Years since quitting: 58.8   Smokeless tobacco: Never  Substance and Sexual Activity   Alcohol use: Yes    Comment: 03/20/2016 "haven't had a drink since the 1990s"   Drug use: No   Sexual activity: Not Currently  Other Topics Concern   Not on file  Social History Narrative   Not on file   Social Determinants of Health   Financial Resource Strain: Not on file  Food Insecurity: Not on file  Transportation Needs: Not on file  Physical Activity: Not on file  Stress: Not on file  Social Connections: Not on file  Intimate Partner Violence: Not on file     Review of Systems: General: negative for chills, fever, night sweats or weight changes.  Cardiovascular: negative for chest pain, dyspnea on exertion, edema, orthopnea, palpitations, paroxysmal nocturnal dyspnea or shortness of breath Dermatological: negative for rash Respiratory: negative for cough or wheezing Urologic: negative for hematuria Abdominal: negative for nausea, vomiting, diarrhea, bright red blood per rectum, melena, or hematemesis Neurologic: negative for visual changes, syncope, or dizziness All other systems reviewed and are otherwise  negative except as noted above.    Blood pressure 136/82, pulse (!) 55, height 5\' 8"  (1.727 m), weight 186 lb (84.4 kg), SpO2 97%.  General appearance: alert and no distress Neck: no adenopathy, no JVD, supple, symmetrical, trachea midline, thyroid not enlarged, symmetric, no tenderness/mass/nodules, and bilateral carotid bruits Lungs: clear to auscultation bilaterally Heart: Harsh 2-3/6 outflow tract murmur consistent with aortic stenosis Extremities: extremities normal, atraumatic, no cyanosis or edema Pulses: 2+ and symmetric Skin: Skin color, texture, turgor normal. No rashes or lesions Neurologic: Grossly normal  EKG EKG Interpretation Date/Time:  Monday July 09 2023 14:12:02 EDT Ventricular Rate:  55 PR Interval:  188 QRS Duration:  90 QT Interval:  440 QTC Calculation: 420 R Axis:   9  Text Interpretation: Sinus bradycardia Possible Inferior infarct (cited on or before 30-Oct-2008) When compared with ECG of 30-Oct-2008 05:20, T wave amplitude has increased in Anterior leads Confirmed by  Nanetta Batty [phone]) on 07/09/2023 2:23:54 PM    ASSESSMENT AND PLAN:   Hyperlipidemia History of hyperlipidemia on Zetia and Praluent with lipid profile performed 06/01/2023 revealing a total cholesterol 116, LDL 41 and HDL 47.  Essential hypertension History of essential hypertension with blood pressure measured today at 136/82.  He is on losartan and metoprolol.  Coronary atherosclerosis History of coronary artery disease status post LAD and diagonal branch stenting by myself 07/14/2003 and subsequent stenting by Dr. Elsie Lincoln 07/31/2003.  He does get some mild stable exertional chest pain which has not changed in frequency or severity.  Carotid artery disease (HCC) History of mild right ICA stenosis.  This will be rechecked in 12 months.  Peripheral vascular disease (HCC) History of peripheral arterial disease status post left renal artery stenting by myself using a 6 mm x 15 mm  long balloon expandable stent after CTA performed 02/04/2016 showed high-grade ostial left renal artery stenosis and proximal right renal artery stenosis as well.  Renal Doppler studies performed 02/02/2023 show a patent left renal artery stent with stable right renal artery velocities.  This will be repeated on an annual basis.  Aortic stenosis, moderate History of moderate aortic stenosis with recent echo performed 3//24 revealing normal LV systolic function with an aortic valve area of 0.8 cm and a peak gradient of 50 mmHg.  This will be repeated on an annual basis.  He does complain of some mild chest tightness with exertion.     Runell Gess MD FACP,FACC,FAHA, [facility] 07/09/2023 2:44 PM

## 2023-07-09 NOTE — Assessment & Plan Note (Signed)
History of moderate aortic stenosis with recent echo performed 3//24 revealing normal LV systolic function with an aortic valve area of 0.8 cm and a peak gradient of 50 mmHg.  This will be repeated on an annual basis.  He does complain of some mild chest tightness with exertion.

## 2023-07-09 NOTE — Assessment & Plan Note (Signed)
History of coronary artery disease status post LAD and diagonal branch stenting by myself 07/14/2003 and subsequent stenting by Dr. Elsie Lincoln 07/31/2003.  He does get some mild stable exertional chest pain which has not changed in frequency or severity.

## 2023-07-09 NOTE — Assessment & Plan Note (Addendum)
History of hyperlipidemia on Zetia and Praluent with lipid profile performed 06/01/2023 revealing a total cholesterol 116, LDL 41 and HDL 47.

## 2023-07-09 NOTE — Assessment & Plan Note (Signed)
History of mild right ICA stenosis.  This will be rechecked in 12 months.

## 2023-07-11 NOTE — Addendum Note (Signed)
Addended by: Bernita Buffy on: 07/11/2023 08:53 AM   Modules accepted: Orders

## 2023-08-07 DIAGNOSIS — R058 Other specified cough: Secondary | ICD-10-CM | POA: Diagnosis not present

## 2023-08-07 DIAGNOSIS — G4733 Obstructive sleep apnea (adult) (pediatric): Secondary | ICD-10-CM | POA: Diagnosis not present

## 2023-08-07 DIAGNOSIS — E1122 Type 2 diabetes mellitus with diabetic chronic kidney disease: Secondary | ICD-10-CM | POA: Diagnosis not present

## 2023-08-07 DIAGNOSIS — J209 Acute bronchitis, unspecified: Secondary | ICD-10-CM | POA: Diagnosis not present

## 2023-08-14 ENCOUNTER — Other Ambulatory Visit: Payer: Self-pay | Admitting: Cardiovascular Disease

## 2023-08-14 DIAGNOSIS — I2510 Atherosclerotic heart disease of native coronary artery without angina pectoris: Secondary | ICD-10-CM

## 2023-08-14 DIAGNOSIS — E782 Mixed hyperlipidemia: Secondary | ICD-10-CM

## 2023-10-08 ENCOUNTER — Other Ambulatory Visit: Payer: Self-pay | Admitting: Cardiovascular Disease

## 2023-12-04 ENCOUNTER — Telehealth: Payer: Self-pay | Admitting: Cardiovascular Disease

## 2023-12-04 NOTE — Telephone Encounter (Signed)
 Patient identification verified by 2 forms. Eric Rail, RN    Called and spoke to patient  Patient states:   -would like to know when follow up appointments are   -unsure if follow up are based on up coming imaging  Informed patient:  -per 10/7 OV, plan is 47month follow up with APP and 47month follow up with Dr. Allyson Sabal Patient scheduled for 6 month follow up 4/3 at 11:30pm with NP [place]  Patient agrees, no questions at this time

## 2023-12-04 NOTE — Telephone Encounter (Signed)
 Patient is asking that the nurse give him a call. States he has something he wanted to discuss with her. Please advise

## 2023-12-07 DIAGNOSIS — I272 Pulmonary hypertension, unspecified: Secondary | ICD-10-CM | POA: Diagnosis not present

## 2023-12-07 DIAGNOSIS — E1122 Type 2 diabetes mellitus with diabetic chronic kidney disease: Secondary | ICD-10-CM | POA: Diagnosis not present

## 2023-12-07 DIAGNOSIS — I350 Nonrheumatic aortic (valve) stenosis: Secondary | ICD-10-CM | POA: Diagnosis not present

## 2023-12-07 DIAGNOSIS — E785 Hyperlipidemia, unspecified: Secondary | ICD-10-CM | POA: Diagnosis not present

## 2023-12-07 DIAGNOSIS — N1831 Chronic kidney disease, stage 3a: Secondary | ICD-10-CM | POA: Diagnosis not present

## 2023-12-07 DIAGNOSIS — R972 Elevated prostate specific antigen [PSA]: Secondary | ICD-10-CM | POA: Diagnosis not present

## 2023-12-07 DIAGNOSIS — I129 Hypertensive chronic kidney disease with stage 1 through stage 4 chronic kidney disease, or unspecified chronic kidney disease: Secondary | ICD-10-CM | POA: Diagnosis not present

## 2023-12-07 DIAGNOSIS — I5189 Other ill-defined heart diseases: Secondary | ICD-10-CM | POA: Diagnosis not present

## 2023-12-07 DIAGNOSIS — M48061 Spinal stenosis, lumbar region without neurogenic claudication: Secondary | ICD-10-CM | POA: Diagnosis not present

## 2023-12-07 DIAGNOSIS — I701 Atherosclerosis of renal artery: Secondary | ICD-10-CM | POA: Diagnosis not present

## 2023-12-07 DIAGNOSIS — I6529 Occlusion and stenosis of unspecified carotid artery: Secondary | ICD-10-CM | POA: Diagnosis not present

## 2023-12-07 DIAGNOSIS — F419 Anxiety disorder, unspecified: Secondary | ICD-10-CM | POA: Diagnosis not present

## 2023-12-07 DIAGNOSIS — I2510 Atherosclerotic heart disease of native coronary artery without angina pectoris: Secondary | ICD-10-CM | POA: Diagnosis not present

## 2023-12-13 DIAGNOSIS — Z85828 Personal history of other malignant neoplasm of skin: Secondary | ICD-10-CM | POA: Diagnosis not present

## 2023-12-13 DIAGNOSIS — D0439 Carcinoma in situ of skin of other parts of face: Secondary | ICD-10-CM | POA: Diagnosis not present

## 2023-12-13 DIAGNOSIS — L821 Other seborrheic keratosis: Secondary | ICD-10-CM | POA: Diagnosis not present

## 2023-12-13 DIAGNOSIS — L570 Actinic keratosis: Secondary | ICD-10-CM | POA: Diagnosis not present

## 2023-12-13 DIAGNOSIS — C44329 Squamous cell carcinoma of skin of other parts of face: Secondary | ICD-10-CM | POA: Diagnosis not present

## 2023-12-13 DIAGNOSIS — D485 Neoplasm of uncertain behavior of skin: Secondary | ICD-10-CM | POA: Diagnosis not present

## 2023-12-31 ENCOUNTER — Ambulatory Visit (HOSPITAL_COMMUNITY): Payer: Medicare Other

## 2024-01-03 ENCOUNTER — Ambulatory Visit: Admitting: General Practice

## 2024-01-17 ENCOUNTER — Other Ambulatory Visit: Payer: Self-pay | Admitting: Cardiovascular Disease

## 2024-01-17 DIAGNOSIS — E782 Mixed hyperlipidemia: Secondary | ICD-10-CM

## 2024-01-17 DIAGNOSIS — I2510 Atherosclerotic heart disease of native coronary artery without angina pectoris: Secondary | ICD-10-CM

## 2024-01-20 NOTE — Progress Notes (Deleted)
 Cardiology Office Note:    Date:  01/20/2024   ID:  Eric [name], DOB 29-Nov-1935, MRN [id number]  PCP:  Margarete Sharps, MD   [facility] Providers Cardiologist:  Lauro Portal, MD { Click to update primary MD,subspecialty MD or APP then REFRESH:1}    Referring MD: Margarete Sharps, MD   No chief complaint on file. ***  History of Present Illness:    Eric [name] is a 88 y.o. male with a hx of renal artery stenosis, HTN, PVC, CAD, carotid artery disease, moderate AS, HLD, and OSA. He has a history of stents to his LAD and diagonal branch on 07/14/2003 with subsequent PCI 07/31/03. He underwent left renal artery PTA and stenting in 2018. Moderate AS noted on echo 2019. He haas stable right ICA stenosis. He is statin intolerant on PCSK9i. Echo in 2019 showed normal LVEF and moderate AS.   I saw him 07/05/2021 for leg swelling and pain that he reports started when he restarted Lipitor .  I advised a statin holiday.  He and his wife were both leery of Lipitor  and Lasix .  I appreciated unilateral leg swelling and was able to rule out a DVT in the office.  Most recent echocardiogram 12/2022 showed a progression in his aortic stenosis to severe, with moderate AI, and mild MR.  He presents today for routine follow up.     Severe AS - due for repeat echo - aortic stenosis progressed from moderate to severe on echo 2024   Lower extremity swelling Continue as needed Lasix  Felt related to his diastolic dysfunction   CAD - s/p remote PCI and stenting in the LAD October 2004 - maintained on ASA, plavix , BB, zetia  - ?on PCSK9i   Hyperlipidemia with LDL goal < 70 08/08/2021: Cholesterol, Total 140; HDL 44; LDL Chol Calc (NIH) 75; Triglycerides 116 - do not feel strongly about repeating a lipid panel - continue zetia  - ?patient assistance for PCSK9i   Hypertension - 100 mg losartan  and 75 mg lopressor  BID   Left renal artery stenosis Status post left renal artery stenting  02/06/2016.  Blood pressure is well controlled.  Recent Dopplers with patent stent.   PAD Right popliteal artery stenosis Denies claudication Last evaluated with Dopplers on 01/24/2021        Past Medical History:  Diagnosis Date   Anxiety state, unspecified    Arthritis    "hands" (03/20/2016)   Benign neoplasm of colon    Bilateral lower extremity edema    CAD (coronary artery disease)    a. 2004: PCI of LAD and D1   Calculus of kidney    Cerebrovascular disease, unspecified    Heart murmur    Hyperlipidemia    Irritable bowel syndrome    Meniere's disease    Myalgia and myositis, unspecified    Myocardial infarction (HCC) 2001   OSA (obstructive sleep apnea)    "had mask; wouldn't wear it" (03/20/2016)   Peripheral vascular disease, unspecified (HCC)    Renal artery stenosis (HCC)    a. 03/2016: s/p PTA and stenting of 90% stenosis of L renal artery with 0% residual stenosis   Right-sided carotid artery disease (HCC)    Unspecified essential hypertension    Unspecified hearing loss     Past Surgical History:  Procedure Laterality Date   CARDIAC CATHETERIZATION     CORONARY ANGIOPLASTY     CORONARY ANGIOPLASTY WITH STENT PLACEMENT     "1 + 2"   CYSTOSCOPY WITH URETEROSCOPY,  STONE BASKETRY AND STENT PLACEMENT     NM MYOVIEW  LTD  [id number]   post stress ejection fraction is 73% normal myocardial perfusion study, low risk scan   PERIPHERAL VASCULAR CATHETERIZATION N/A 03/20/2016   Procedure: Renal Angiography;  Surgeon: Avanell Leigh, MD;  Location: [facility] INVASIVE CV LAB;  Service: Cardiovascular;  Laterality: N/A;   RENAL ARTERY STENT  03/20/2016   TONSILLECTOMY  ~ 1945   TYMPANOSTOMY TUBE PLACEMENT Right    "for Meniere's"    Current Medications: No outpatient medications have been marked as taking for the 02/01/24 encounter (Appointment) with Lamond Pilot, PA.     Allergies:   Niaspan [niacin er (antihyperlipidemic)], Sertraline hcl, and Tetanus toxoids    Social History   Socioeconomic History   Marital status: Married    Spouse name: ruth   Number of children: 4   Years of education: Not on file   Highest education level: Not on file  Occupational History   Occupation: Retired  Tobacco Use   Smoking status: Former    Current packs/day: 0.00    Average packs/day: 1 pack/day for 15.0 years (15.0 ttl pk-yrs)    Types: Cigarettes    Start date: 10/02/1949    Quit date: 10/02/1964    Years since quitting: 59.3   Smokeless tobacco: Never  Substance and Sexual Activity   Alcohol  use: Yes    Comment: 03/20/2016 "haven't had a drink since the 1990s"   Drug use: No   Sexual activity: Not Currently  Other Topics Concern   Not on file  Social History Narrative   Not on file   Social Drivers of Health   Financial Resource Strain: Not on file  Food Insecurity: Not on file  Transportation Needs: Not on file  Physical Activity: Not on file  Stress: Not on file  Social Connections: Not on file     Family History: The patient's ***family history includes Breast cancer in his sister; Heart disease in his brother and father; Lung cancer in his brother.  ROS:   Please see the history of present illness.    *** All other systems reviewed and are negative.  EKGs/Labs/Other Studies Reviewed:    The following studies were reviewed today: ***      Recent Labs: No results found for requested labs within last 365 days.  Recent Lipid Panel    Component Value Date/Time   CHOL 140 08/08/2021 0819   TRIG 116 08/08/2021 0819   HDL 44 08/08/2021 0819   CHOLHDL 3.2 08/08/2021 0819   CHOLHDL 4.3 10/09/2014 0813   VLDL 53 (H) 10/09/2014 0813   LDLCALC 75 08/08/2021 0819   LDLDIRECT 63.1 07/09/2013 0822     Risk Assessment/Calculations:   {Does this patient have ATRIAL FIBRILLATION?:[phone]}  No BP recorded.  {Refresh Note OR Click here to enter BP  :1}***         Physical Exam:    VS:  There were no vitals taken for this  visit.    Wt Readings from Last 3 Encounters:  07/09/23 186 lb (84.4 kg)  07/04/22 187 lb (84.8 kg)  06/01/22 189 lb (85.7 kg)     GEN: *** Well nourished, well developed in no acute distress HEENT: Normal NECK: No JVD; No carotid bruits LYMPHATICS: No lymphadenopathy CARDIAC: ***RRR, no murmurs, rubs, gallops RESPIRATORY:  Clear to auscultation without rales, wheezing or rhonchi  ABDOMEN: Soft, non-tender, non-distended MUSCULOSKELETAL:  No edema; No deformity  SKIN: Warm and dry NEUROLOGIC:  Alert and oriented x 3 PSYCHIATRIC:  Normal affect   ASSESSMENT:    No diagnosis found. PLAN:    In order of problems listed above:  ***      {Are you ordering a CV Procedure (e.g. stress test, cath, DCCV, TEE, etc)?   Press F2        :[id number]}    Medication Adjustments/Labs and Tests Ordered: Current medicines are reviewed at length with the patient today.  Concerns regarding medicines are outlined above.  No orders of the defined types were placed in this encounter.  No orders of the defined types were placed in this encounter.   There are no Patient Instructions on file for this visit.   Signed, Warren Haber Maeci Kalbfleisch, [place]  01/20/2024 7:17 AM    [facility]

## 2024-01-21 DIAGNOSIS — C44329 Squamous cell carcinoma of skin of other parts of face: Secondary | ICD-10-CM | POA: Diagnosis not present

## 2024-01-21 DIAGNOSIS — Z85828 Personal history of other malignant neoplasm of skin: Secondary | ICD-10-CM | POA: Diagnosis not present

## 2024-01-30 ENCOUNTER — Ambulatory Visit (HOSPITAL_COMMUNITY): Attending: Cardiovascular Disease

## 2024-01-30 DIAGNOSIS — I10 Essential (primary) hypertension: Secondary | ICD-10-CM | POA: Diagnosis not present

## 2024-01-30 DIAGNOSIS — E782 Mixed hyperlipidemia: Secondary | ICD-10-CM

## 2024-01-30 DIAGNOSIS — I2510 Atherosclerotic heart disease of native coronary artery without angina pectoris: Secondary | ICD-10-CM | POA: Diagnosis not present

## 2024-01-30 LAB — ECHOCARDIOGRAM COMPLETE
AR max vel: 0.89 cm2
AV Area VTI: 0.84 cm2
AV Area mean vel: 0.77 cm2
AV Mean grad: 35 mmHg
AV Peak grad: 49.8 mmHg
Ao pk vel: 3.53 m/s
Area-P 1/2: 2.24 cm2
Est EF: 55
MV VTI: 1.24 cm2
P 1/2 time: 413 ms
S' Lateral: 2.9 cm

## 2024-02-01 ENCOUNTER — Ambulatory Visit: Admitting: Physician Assistant

## 2024-02-29 ENCOUNTER — Ambulatory Visit (HOSPITAL_COMMUNITY)
Admission: RE | Admit: 2024-02-29 | Discharge: 2024-02-29 | Disposition: A | Discharge status: Home / Self Care | Payer: Medicare Other | Source: Ambulatory Visit | Attending: Vascular Surgery | Admitting: Vascular Surgery

## 2024-02-29 ENCOUNTER — Ambulatory Visit (HOSPITAL_COMMUNITY)
Admission: RE | Admit: 2024-02-29 | Discharge: 2024-02-29 | Disposition: A | Discharge status: Home / Self Care | Payer: Medicare Other | Source: Ambulatory Visit | Attending: Cardiovascular Disease | Admitting: Cardiovascular Disease

## 2024-02-29 DIAGNOSIS — E782 Mixed hyperlipidemia: Secondary | ICD-10-CM | POA: Diagnosis not present

## 2024-02-29 DIAGNOSIS — I701 Atherosclerosis of renal artery: Secondary | ICD-10-CM | POA: Diagnosis not present

## 2024-02-29 DIAGNOSIS — I6523 Occlusion and stenosis of bilateral carotid arteries: Secondary | ICD-10-CM

## 2024-03-03 ENCOUNTER — Ambulatory Visit: Payer: Self-pay | Admitting: Cardiology

## 2024-03-03 DIAGNOSIS — I701 Atherosclerosis of renal artery: Secondary | ICD-10-CM

## 2024-03-03 NOTE — Progress Notes (Signed)
 No evidence of carotid artery stenosis.  Bilateral external carotid artery stenosis of >50% may explain bruit.  No further follow-up is needed.

## 2024-03-03 NOTE — Progress Notes (Signed)
 No evidence of renal artery stenosis. No indication for evaluation of celiac stenosis (asymptomatic)

## 2024-03-10 NOTE — Progress Notes (Unsigned)
 Cardiology Clinic Note   Patient Name: Eric [name] Date of Encounter: 03/10/2024  Primary Care Provider:  Margarete Sharps, MD Primary Cardiologist:  Lauro Portal, MD  Patient Profile    ***  Past Medical History    Past Medical History:  Diagnosis Date   Anxiety state, unspecified    Arthritis    "hands" (03/20/2016)   Benign neoplasm of colon    Bilateral lower extremity edema    CAD (coronary artery disease)    a. 2004: PCI of LAD and D1   Calculus of kidney    Cerebrovascular disease, unspecified    Heart murmur    Hyperlipidemia    Irritable bowel syndrome    Meniere's disease    Myalgia and myositis, unspecified    Myocardial infarction (HCC) 2001   OSA (obstructive sleep apnea)    "had mask; wouldn't wear it" (03/20/2016)   Peripheral vascular disease, unspecified (HCC)    Renal artery stenosis (HCC)    a. 03/2016: s/p PTA and stenting of 90% stenosis of L renal artery with 0% residual stenosis   Right-sided carotid artery disease (HCC)    Unspecified essential hypertension    Unspecified hearing loss    Past Surgical History:  Procedure Laterality Date   CARDIAC CATHETERIZATION     CORONARY ANGIOPLASTY     CORONARY ANGIOPLASTY WITH STENT PLACEMENT     "1 + 2"   CYSTOSCOPY WITH URETEROSCOPY, STONE BASKETRY AND STENT PLACEMENT     NM MYOVIEW LTD  [id number]   post stress ejection fraction is 73% normal myocardial perfusion study, low risk scan   PERIPHERAL VASCULAR CATHETERIZATION N/A 03/20/2016   Procedure: Renal Angiography;  Surgeon: Avanell Leigh, MD;  Location: [facility] INVASIVE CV LAB;  Service: Cardiovascular;  Laterality: N/A;   RENAL ARTERY STENT  03/20/2016   TONSILLECTOMY  ~ 1945   TYMPANOSTOMY TUBE PLACEMENT Right    "for Meniere's"    Allergies  Allergies  Allergen Reactions   Niaspan [Niacin Er (Antihyperlipidemic)]    Sertraline Hcl     REACTION: disorientation   Tetanus Toxoids     History of Present Illness    ***  Home  Medications    Prior to Admission medications   Medication Sig Start Date End Date Taking? Authorizing Provider  Alirocumab  (PRALUENT ) 150 MG/ML SOAJ INJECT 150 MG INTO THE SKIN EVERY 14 (FOURTEEN) DAYS. 01/17/24   Avanell Leigh, MD  aspirin  81 MG tablet Take 81 mg by mouth daily.    [provider]  clopidogrel  (PLAVIX ) 75 MG tablet TAKE 1 TABLET BY MOUTH EVERY DAY 10/09/23   Avanell Leigh, MD  diazepam  (VALIUM ) 5 MG tablet TAKE 1 TABLET EVERY 8 HOURS AS NEEDED 01/28/14   Lorraine Roses, MD  ezetimibe  (ZETIA ) 10 MG tablet TAKE 1 TABLET BY MOUTH DAILY 08/08/17   Berry, Jonathan J, MD  fluticasone (FLONASE) 50 MCG/ACT nasal spray Place 2 sprays into both nostrils daily. 03/03/17   [provider]  furosemide  (LASIX ) 20 MG tablet Take 80 mg by mouth as needed. Take 4 tablets on Monday, Weds, Friday & Sat    [provider]  losartan  (COZAAR ) 100 MG tablet Take 100 mg by mouth daily. 04/05/16   [provider]  meclizine  (ANTIVERT ) 25 MG tablet TAKE ONE TABLET BY MOUTH EVERY 8 HOURS AS NEEDED FOR DIZZINESS 01/07/14   Lorraine Roses, MD  metoprolol  tartrate (LOPRESSOR ) 50 MG tablet TAKE 1.5 TABLETS BY MOUTH TWICE DAILY  09/20/17   Avanell Leigh, MD  nitroGLYCERIN  (NITROSTAT ) 0.4 MG SL tablet Place 1 tablet (0.4 mg total) under the tongue every 5 (five) minutes as needed for chest pain. 05/22/14   Ruddy Corral M, PA-C  pantoprazole  (PROTONIX ) 40 MG tablet Take 1 tablet (40 mg total) by mouth daily. Patient taking differently: Take 40 mg by mouth 2 (two) times daily. 06/11/17   Avanell Leigh, MD    Family History    Family History  Problem Relation Age of Onset   Heart disease Father    Heart disease Brother    Lung cancer Brother    Breast cancer Sister    He indicated that his mother is deceased. He indicated that his father is deceased. He indicated that the status of his sister is unknown. He indicated that his maternal grandmother is deceased. He  indicated that his maternal grandfather is deceased. He indicated that his paternal grandmother is deceased. He indicated that his paternal grandfather is deceased.  Social History    Social History   Socioeconomic History   Marital status: Married    Spouse name: ruth   Number of children: 4   Years of education: Not on file   Highest education level: Not on file  Occupational History   Occupation: Retired  Tobacco Use   Smoking status: Former    Current packs/day: 0.00    Average packs/day: 1 pack/day for 15.0 years (15.0 ttl pk-yrs)    Types: Cigarettes    Start date: 10/02/1949    Quit date: 10/02/1964    Years since quitting: 59.4   Smokeless tobacco: Never  Substance and Sexual Activity   Alcohol  use: Yes    Comment: 03/20/2016 "haven't had a drink since the 1990s"   Drug use: No   Sexual activity: Not Currently  Other Topics Concern   Not on file  Social History Narrative   Not on file   Social Drivers of Health   Financial Resource Strain: Not on file  Food Insecurity: Not on file  Transportation Needs: Not on file  Physical Activity: Not on file  Stress: Not on file  Social Connections: Not on file  Intimate Partner Violence: Not on file     Review of Systems    General:  No chills, fever, night sweats or weight changes.  Cardiovascular:  No chest pain, dyspnea on exertion, edema, orthopnea, palpitations, paroxysmal nocturnal dyspnea. Dermatological: No rash, lesions/masses Respiratory: No cough, dyspnea Urologic: No hematuria, dysuria Abdominal:   No nausea, vomiting, diarrhea, bright red blood per rectum, melena, or hematemesis Neurologic:  No visual changes, wkns, changes in mental status. All other systems reviewed and are otherwise negative except as noted above.  Physical Exam    VS:  There were no vitals taken for this visit. , BMI There is no height or weight on file to calculate BMI. GEN: Well nourished, well developed, in no acute  distress. HEENT: normal. Neck: Supple, no JVD, carotid bruits, or masses. Cardiac: RRR, no murmurs, rubs, or gallops. No clubbing, cyanosis, edema.  Radials/DP/PT 2+ and equal bilaterally.  Respiratory:  Respirations regular and unlabored, clear to auscultation bilaterally. GI: Soft, nontender, nondistended, BS + x 4. MS: no deformity or atrophy. Skin: warm and dry, no rash. Neuro:  Strength and sensation are intact. Psych: Normal affect.  Accessory Clinical Findings    Recent Labs: No results found for requested labs within last 365 days.   Recent Lipid Panel    Component Value  Date/Time   CHOL 140 08/08/2021 0819   TRIG 116 08/08/2021 0819   HDL 44 08/08/2021 0819   CHOLHDL 3.2 08/08/2021 0819   CHOLHDL 4.3 10/09/2014 0813   VLDL 53 (H) 10/09/2014 0813   LDLCALC 75 08/08/2021 0819   LDLDIRECT 63.1 07/09/2013 0822    No BP recorded.  {Refresh Note OR Click here to enter BP  :1}***    ECG personally reviewed by me today- ***          Assessment & Plan   1.  ***   Chet Cota. Deontez Klinke NP-C     03/10/2024, 1:01 PM [facility] [address] Office [phone] Fax [phone]    I spent***minutes examining this patient, reviewing medications, and using patient centered shared decision making involving their cardiac care.   I spent  20 minutes reviewing past medical history,  medications, and prior cardiac tests.

## 2024-03-13 ENCOUNTER — Encounter: Payer: Self-pay | Admitting: General Practice

## 2024-03-13 ENCOUNTER — Ambulatory Visit: Attending: General Practice | Admitting: General Practice

## 2024-03-13 VITALS — BP 118/60 | HR 60 | Ht 68.0 in | Wt 192.6 lb

## 2024-03-13 DIAGNOSIS — I701 Atherosclerosis of renal artery: Secondary | ICD-10-CM | POA: Insufficient documentation

## 2024-03-13 DIAGNOSIS — I6523 Occlusion and stenosis of bilateral carotid arteries: Secondary | ICD-10-CM | POA: Insufficient documentation

## 2024-03-13 DIAGNOSIS — I2510 Atherosclerotic heart disease of native coronary artery without angina pectoris: Secondary | ICD-10-CM | POA: Diagnosis not present

## 2024-03-13 DIAGNOSIS — I350 Nonrheumatic aortic (valve) stenosis: Secondary | ICD-10-CM | POA: Insufficient documentation

## 2024-03-13 DIAGNOSIS — E782 Mixed hyperlipidemia: Secondary | ICD-10-CM | POA: Insufficient documentation

## 2024-03-13 NOTE — Patient Instructions (Addendum)
Medication Instructions:  No medication changes were made during today's visit.  *If you need a refill on your cardiac medications before your next appointment, please call your pharmacy*   Lab Work: No labs were ordered during today's visit.  If you have labs (blood work) drawn today and your tests are completely normal, you will receive your results only by: MyChart Message (if you have MyChart) OR A paper copy in the mail If you have any lab test that is abnormal or we need to change your treatment, we will call you to review the results.   Testing/Procedures: No procedures were ordered during today's visit.    Follow-Up: At [facility], you and your health needs are our priority.  As part of our continuing mission to provide you with exceptional heart care, we have created designated Provider Care Teams.  These Care Teams include your primary Cardiologist (physician) and Advanced Practice Providers (APPs -  Physician Assistants and Nurse Practitioners) who all work together to provide you with the care you need, when you need it.  We recommend signing up for the patient portal called MyChart.  Sign up information is provided on this After Visit Summary.  MyChart is used to connect with patients for Virtual Visits (Telemedicine).  Patients are able to view lab/test results, encounter notes, upcoming appointments, etc.  Non-urgent messages can be sent to your provider as well.   To learn more about what you can do with MyChart, go to [email or web address].    Your next appointment:   6 month(s)  Provider:   Lawana Pray, NP          Other Instructions Thank you for choosing [facility]!     [employer]  You[employer] worker for [employer] Mailing Address:  [address];   [address]  [place], [place] [address]  Tel [phone] Fx [phone]     High Quality Legwear for Today's Active Lifestyles Maximum  Compression at the ankle. Compression lessens gradually up the leg.   We manufacture a wide range of compression hosiery for men and women in  different styles, constructions and levels of support.  How Compression Hosiery Works Regulatory affairs officer, [employer]. compression hosiery works by applying graduated pressure to the  muscles and veins in the legs.  When the calf muscle contracts such as during walking  the compression hosiery will "give" and then return to its original position. By doing so  the hosiery is assists your body's circulatory wellness.  The result is increased leg health and vitality.   Maximum Compression at the ankle Compression lessens gradually up the leg  We Offer: Sheer & Opaque Stockings       COLORS:  Nude, black, white and misc. prints Below Knee Thigh High Pantyhose  High Quality Legwear for Today's Active Lifestyles We manufacture a wide range of compression hosiery for men and women in different styles, construction sand levels of support.  Socks:                     Sheer & Opaque   Compression Levels Include:                                  Stockings Men's               Below Knee  8-15 mmHg   Women's         Thigh High                 15-20 mmHg  Unisex             Pantyhose                 20-30 mmHg                                                                      30-40 mmHg  4 Simple Ways to Order   Email  [email or web address] Mail/Email orders are subject to processing and handling charges. Allow 7-10 days for receipt.  Phone [phone]  Please allow 24 hours for return call.   In Person  We recommend calling prior to your visit to confirm store hours as they may change due to holiday, weather, and maintenance.   By Mail When placing an order, please have the following information available. Our representatives are available to assist.     Measurements    THIGH      in.   CALF        in.   ANKLE     in.    Compression   8-15 mmHg 15-20 mmHg**   20-30 mmHg 30-40 mmHg   WOMEN'S MEN'S  Shoe Size Sock Size Shoe Size Sock Size  4 - 5 Small 7.5 and Under Small  5.5 - 7.5 Medium 8 - 10 Medium  8 - 10 Large 10.5 - 12 Large  10.5 and Over X-Large 12.5 and Over X-Large   Knee High Size Chart  Length from CALF MEASUREMENT  floor to bend   in knee. 11 12 13 14 15 16 17 18 19 20  21" 22"  14 S S S S M M L L L XL XL XL  15 S S S M M L L L XL XL XXL XXL  16 S S M M M L L L XL XXL XXL XXL  17 S M M M M L L XL XL XXL XXL XXL  18 M M M M L L L XL XL XXL XXL XXL  19 M M M M L L XL XL XL XXL XXL XXL   Thigh High Circumference Sizing Chart                 S M L XL XXL  ANKLE 6.5 - 8 8 - 9.5 9.5 - 11 11 - 12.5 12.5 - 14  CALF 10.5 - 14.5 11.5 - 15.5 12.5 - 17 13.5 - 17.5 14.5 - 19.5  THIGH 15.5 - 22 17.5 - 24 19.5 - 26 22 - 28 26 - 32  HIP UP TO 40 UP TO 44 UP TO 48' UP TO 52 UP TO 56   Pantyhose Size Chart  Height Petite Medium Tall X-Tall Queen Queen +   Weight Weight Weight Weight Weight Weight  4'11 95-130 135      5'0 95-125 130-145   170-185   5'1 90-120 125-155 160-165  170-195   5'2 90-115 120-145 150-165  170-195   5'3 90-110 115-140 145-165  170-200 200-225  5'4 100-105 110-135 140-160 165 170-200  200-225  5'5 100 105-130 135-160 165 170-200 200-225  5'6  110-125 130-155 160-165 170-200 200-225  5'7  110-120 125-150 155-165 170-200 195-225  5'8   120-145 150-165 170-200 190-225  5'9   125-140 145-170 175-190 185-220  5'10   125-135 140-185  185-215  5'11   130-135 140-185  190-210    

## 2024-03-17 DIAGNOSIS — Z85828 Personal history of other malignant neoplasm of skin: Secondary | ICD-10-CM | POA: Diagnosis not present

## 2024-03-17 DIAGNOSIS — L570 Actinic keratosis: Secondary | ICD-10-CM | POA: Diagnosis not present

## 2024-03-17 DIAGNOSIS — L905 Scar conditions and fibrosis of skin: Secondary | ICD-10-CM | POA: Diagnosis not present

## 2024-03-17 DIAGNOSIS — L821 Other seborrheic keratosis: Secondary | ICD-10-CM | POA: Diagnosis not present

## 2024-05-18 ENCOUNTER — Other Ambulatory Visit: Payer: Self-pay | Admitting: Cardiovascular Disease

## 2024-05-18 DIAGNOSIS — I2510 Atherosclerotic heart disease of native coronary artery without angina pectoris: Secondary | ICD-10-CM

## 2024-05-18 DIAGNOSIS — E782 Mixed hyperlipidemia: Secondary | ICD-10-CM

## 2024-06-03 DIAGNOSIS — R011 Cardiac murmur, unspecified: Secondary | ICD-10-CM | POA: Diagnosis not present

## 2024-06-03 DIAGNOSIS — N1831 Chronic kidney disease, stage 3a: Secondary | ICD-10-CM | POA: Diagnosis not present

## 2024-06-03 DIAGNOSIS — I050 Rheumatic mitral stenosis: Secondary | ICD-10-CM | POA: Diagnosis not present

## 2024-06-03 DIAGNOSIS — R051 Acute cough: Secondary | ICD-10-CM | POA: Diagnosis not present

## 2024-06-03 DIAGNOSIS — Z1152 Encounter for screening for COVID-19: Secondary | ICD-10-CM | POA: Diagnosis not present

## 2024-06-03 DIAGNOSIS — J069 Acute upper respiratory infection, unspecified: Secondary | ICD-10-CM | POA: Diagnosis not present

## 2024-06-03 DIAGNOSIS — I5189 Other ill-defined heart diseases: Secondary | ICD-10-CM | POA: Diagnosis not present

## 2024-06-03 DIAGNOSIS — J040 Acute laryngitis: Secondary | ICD-10-CM | POA: Diagnosis not present

## 2024-06-03 DIAGNOSIS — I129 Hypertensive chronic kidney disease with stage 1 through stage 4 chronic kidney disease, or unspecified chronic kidney disease: Secondary | ICD-10-CM | POA: Diagnosis not present

## 2024-06-03 DIAGNOSIS — I272 Pulmonary hypertension, unspecified: Secondary | ICD-10-CM | POA: Diagnosis not present

## 2024-06-03 DIAGNOSIS — R0981 Nasal congestion: Secondary | ICD-10-CM | POA: Diagnosis not present

## 2024-06-03 DIAGNOSIS — I350 Nonrheumatic aortic (valve) stenosis: Secondary | ICD-10-CM | POA: Diagnosis not present

## 2024-06-10 DIAGNOSIS — E785 Hyperlipidemia, unspecified: Secondary | ICD-10-CM | POA: Diagnosis not present

## 2024-06-10 DIAGNOSIS — N1831 Chronic kidney disease, stage 3a: Secondary | ICD-10-CM | POA: Diagnosis not present

## 2024-06-10 DIAGNOSIS — I1310 Hypertensive heart and chronic kidney disease without heart failure, with stage 1 through stage 4 chronic kidney disease, or unspecified chronic kidney disease: Secondary | ICD-10-CM | POA: Diagnosis not present

## 2024-06-10 DIAGNOSIS — E1122 Type 2 diabetes mellitus with diabetic chronic kidney disease: Secondary | ICD-10-CM | POA: Diagnosis not present

## 2024-06-10 DIAGNOSIS — Z125 Encounter for screening for malignant neoplasm of prostate: Secondary | ICD-10-CM | POA: Diagnosis not present

## 2024-06-17 DIAGNOSIS — I5189 Other ill-defined heart diseases: Secondary | ICD-10-CM | POA: Diagnosis not present

## 2024-06-17 DIAGNOSIS — I129 Hypertensive chronic kidney disease with stage 1 through stage 4 chronic kidney disease, or unspecified chronic kidney disease: Secondary | ICD-10-CM | POA: Diagnosis not present

## 2024-06-17 DIAGNOSIS — Z23 Encounter for immunization: Secondary | ICD-10-CM | POA: Diagnosis not present

## 2024-06-17 DIAGNOSIS — I350 Nonrheumatic aortic (valve) stenosis: Secondary | ICD-10-CM | POA: Diagnosis not present

## 2024-06-17 DIAGNOSIS — I739 Peripheral vascular disease, unspecified: Secondary | ICD-10-CM | POA: Diagnosis not present

## 2024-06-17 DIAGNOSIS — I701 Atherosclerosis of renal artery: Secondary | ICD-10-CM | POA: Diagnosis not present

## 2024-06-17 DIAGNOSIS — E785 Hyperlipidemia, unspecified: Secondary | ICD-10-CM | POA: Diagnosis not present

## 2024-06-17 DIAGNOSIS — E1122 Type 2 diabetes mellitus with diabetic chronic kidney disease: Secondary | ICD-10-CM | POA: Diagnosis not present

## 2024-06-17 DIAGNOSIS — Z1331 Encounter for screening for depression: Secondary | ICD-10-CM | POA: Diagnosis not present

## 2024-06-17 DIAGNOSIS — I2510 Atherosclerotic heart disease of native coronary artery without angina pectoris: Secondary | ICD-10-CM | POA: Diagnosis not present

## 2024-06-17 DIAGNOSIS — R82998 Other abnormal findings in urine: Secondary | ICD-10-CM | POA: Diagnosis not present

## 2024-06-17 DIAGNOSIS — I050 Rheumatic mitral stenosis: Secondary | ICD-10-CM | POA: Diagnosis not present

## 2024-06-17 DIAGNOSIS — N1831 Chronic kidney disease, stage 3a: Secondary | ICD-10-CM | POA: Diagnosis not present

## 2024-06-17 DIAGNOSIS — Z0000 Encounter for general adult medical examination without abnormal findings: Secondary | ICD-10-CM | POA: Diagnosis not present

## 2024-06-17 DIAGNOSIS — I6529 Occlusion and stenosis of unspecified carotid artery: Secondary | ICD-10-CM | POA: Diagnosis not present

## 2024-07-08 ENCOUNTER — Other Ambulatory Visit (HOSPITAL_COMMUNITY): Payer: Self-pay

## 2024-07-08 ENCOUNTER — Telehealth: Payer: Self-pay | Admitting: Pharmacy Technician

## 2024-07-08 ENCOUNTER — Telehealth: Payer: Self-pay | Admitting: Pharmacist

## 2024-07-08 NOTE — Telephone Encounter (Signed)
 Patient called to be re-enrolled in [facility] grant for Praluent 

## 2024-07-08 NOTE — Telephone Encounter (Signed)
 Patient Advocate Encounter   The patient was approved for a [employer] grant that will help cover the cost of Praluent  Total amount awarded, 2500.00.  Effective: 07/09/24 - 07/08/25   APW:[id number] ERW:[id number] Hmnle:[id number] PI:[id number]  [employer] ID: [id number]   Pharmacy provided with approval and processing information. Patient informed via phone

## 2024-07-16 DIAGNOSIS — L0889 Other specified local infections of the skin and subcutaneous tissue: Secondary | ICD-10-CM | POA: Diagnosis not present

## 2024-07-16 DIAGNOSIS — Z85828 Personal history of other malignant neoplasm of skin: Secondary | ICD-10-CM | POA: Diagnosis not present

## 2024-07-16 DIAGNOSIS — L0203 Carbuncle of face: Secondary | ICD-10-CM | POA: Diagnosis not present

## 2024-07-23 DIAGNOSIS — A4902 Methicillin resistant Staphylococcus aureus infection, unspecified site: Secondary | ICD-10-CM | POA: Diagnosis not present

## 2024-07-23 DIAGNOSIS — Z85828 Personal history of other malignant neoplasm of skin: Secondary | ICD-10-CM | POA: Diagnosis not present

## 2024-09-02 ENCOUNTER — Other Ambulatory Visit: Payer: Self-pay | Admitting: Cardiovascular Disease

## 2024-09-09 ENCOUNTER — Ambulatory Visit: Attending: Cardiovascular Disease | Admitting: Cardiovascular Disease

## 2024-09-09 ENCOUNTER — Encounter: Payer: Self-pay | Admitting: Cardiovascular Disease

## 2024-09-09 VITALS — BP 110/70 | HR 58 | Ht 67.0 in | Wt 186.0 lb

## 2024-09-09 DIAGNOSIS — E782 Mixed hyperlipidemia: Secondary | ICD-10-CM

## 2024-09-09 DIAGNOSIS — I739 Peripheral vascular disease, unspecified: Secondary | ICD-10-CM | POA: Diagnosis present

## 2024-09-09 DIAGNOSIS — I10 Essential (primary) hypertension: Secondary | ICD-10-CM

## 2024-09-09 DIAGNOSIS — I350 Nonrheumatic aortic (valve) stenosis: Secondary | ICD-10-CM | POA: Diagnosis present

## 2024-09-09 DIAGNOSIS — I2510 Atherosclerotic heart disease of native coronary artery without angina pectoris: Secondary | ICD-10-CM

## 2024-09-09 NOTE — Assessment & Plan Note (Signed)
 History of moderate aortic stenosis with recent 2D echo performed/30/25 revealing normal LV systolic function with a aortic valve area of 0.84 cm with a mean gradient of 35 mmHg.  He is completely asymptomatic.  Will continue to follow this on an annual basis.

## 2024-09-09 NOTE — Progress Notes (Signed)
 09/09/2024 Eric [name]   08-13-1936  [id number]  Primary Physician Onita Rush, MD Primary Cardiologist: Dorn JINNY Lesches MD GENI CODY MADEIRA, FSCAI  HPI:  Eric [name] is a 88 y.o.   mildly to moderately overweight married Caucasian male father of 4, grandfather to 8 grandchildren who is accompanied by his wife Eric [name] today as usual. I last saw him in the office 07/09/2023.SABRA He has a history of CAD and PVOD. I stented his LAD and diagonal branch back on July 14, 2003 and subsequent stenting by Dr. Lavon July 31, 2003. He had mild bilateral renal artery stenosis at cath with blockages in the 40% range as well as moderate right internal carotid artery stenosis by duplex ultrasound. He was neurologic asymptomatic. His other problems include hypertension and hyperlipidemia. He has obstructive sleep apnea followed by Dr. Burnard. His most recent lab work revealed a total cholesterol of 125, LDL of 58 and HDL of 36. Carotid Dopplers performed in September 2016 revealed moderate right ICA stenosis, unchanged from a prior study and renal Dopplers performed 01/05/16 showed progression of bilateral renal artery stenosis. CT angiography performed 02/04/16 that showed high-grade ostial left renal artery stenosis with probable right renal artery stenosis as well. The patient underwent left renal artery PTA and stenting using a 6 mm x 15 mm long balloon expandable stent with excellent angiographic result. His follow-up Dopplers performed on 10/16/16 showed marked improvement in his velocities and renal aortic ratios. Since I saw me regarding his remaining currently stable. He does have moderate aortic stenosis by 2-D echo performed 10/15/2017 the valve area 1 cm and a peak gradient of 34 mmHg.     Recent noninvasive tests show stable right ICA stenosis, patent left renal artery stent with moderate right renal artery stenosis and non-compressible vessels not allowing the determination of ABIs.  He does  complain of pain in his hips and legs which does not sound like claudication.  It may be statin related and we talked about starting a statin holiday for 3 months.   Since stopping his high-dose statin  his lower extremity pain and hip pain has significantly improved suggesting this was a statin intolerance.  As result, we did start Praluent  which has improved his lipid profile.  Unfortunately, his insurance company no longer covers this and he was unable to complete the medication assistance paperwork.  2D echo performed 10/05/2017 revealed normal LV function with a valve area of 1.1 cm and a peak gradient of 31 mmHg although the patient is asymptomatic from this.   Since I saw him in the office a year ago he has remained stable.  He does complain of some back and leg pain which apparently has been diagnosed with spinal stenosis but refused surgery.  He does get some mild effort angina which has remained stable.  His most recent 2D echo performed 01/30/24 revealed moderate aortic stenosis area 0.8 cm and a peak gradient of 50 mmHg.   Current Meds  Medication Sig   Alirocumab  (PRALUENT ) 150 MG/ML SOAJ INJECT 150MG  DOSE INTO THE SKIN EVERY 14 DAYS   aspirin  81 MG tablet Take 81 mg by mouth daily.   azithromycin (ZITHROMAX) 250 MG tablet Take as directed x 5 days Orally as directed; Duration: 5 days   clopidogrel  (PLAVIX ) 75 MG tablet TAKE 1 TABLET BY MOUTH EVERY DAY   diazepam  (VALIUM ) 5 MG tablet TAKE 1 TABLET EVERY 8 HOURS AS NEEDED   ezetimibe  (ZETIA ) 10 MG tablet  TAKE 1 TABLET BY MOUTH DAILY   fluticasone (FLONASE) 50 MCG/ACT nasal spray Place 2 sprays into both nostrils daily.   furosemide  (LASIX ) 20 MG tablet Take 80 mg by mouth as needed. Take 4 tablets on Monday, Weds, Friday & Sat   losartan  (COZAAR ) 100 MG tablet Take 100 mg by mouth daily.   meclizine  (ANTIVERT ) 25 MG tablet TAKE ONE TABLET BY MOUTH EVERY 8 HOURS AS NEEDED FOR DIZZINESS   metoprolol  tartrate (LOPRESSOR ) 50 MG tablet TAKE  1.5 TABLETS BY MOUTH TWICE DAILY   nitroGLYCERIN  (NITROSTAT ) 0.4 MG SL tablet Place 1 tablet (0.4 mg total) under the tongue every 5 (five) minutes as needed for chest pain.   pantoprazole  (PROTONIX ) 40 MG tablet Take 1 tablet (40 mg total) by mouth daily.     Allergies  Allergen Reactions   Niaspan [Niacin Er (Antihyperlipidemic)]    Sertraline Hcl     REACTION: disorientation   Tetanus Toxoid-Containing Vaccines     Social History   Socioeconomic History   Marital status: Married    Spouse name: Eric [name]   Number of children: 4   Years of education: Not on file   Highest education level: Not on file  Occupational History   Occupation: Retired  Tobacco Use   Smoking status: Former    Current packs/day: 0.00    Average packs/day: 1 pack/day for 15.0 years (15.0 ttl pk-yrs)    Types: Cigarettes    Start date: 10/02/1949    Quit date: 10/02/1964    Years since quitting: 59.9   Smokeless tobacco: Never  Substance and Sexual Activity   Alcohol  use: Yes    Comment: 03/20/2016 haven't had a drink since the 1990s   Drug use: No   Sexual activity: Not Currently  Other Topics Concern   Not on file  Social History Narrative   Not on file   Social Drivers of Health   Financial Resource Strain: Not on file  Food Insecurity: Not on file  Transportation Needs: Not on file  Physical Activity: Not on file  Stress: Not on file  Social Connections: Not on file  Intimate Partner Violence: Not on file     Review of Systems: General: negative for chills, fever, night sweats or weight changes.  Cardiovascular: negative for chest pain, dyspnea on exertion, edema, orthopnea, palpitations, paroxysmal nocturnal dyspnea or shortness of breath Dermatological: negative for rash Respiratory: negative for cough or wheezing Urologic: negative for hematuria Abdominal: negative for nausea, vomiting, diarrhea, bright red blood per rectum, melena, or hematemesis Neurologic: negative for visual  changes, syncope, or dizziness All other systems reviewed and are otherwise negative except as noted above.    Blood pressure 110/70, pulse (!) 58, height 5' 7 (1.702 m), weight 186 lb (84.4 kg), SpO2 99%.  General appearance: alert and no distress Neck: no adenopathy, no carotid bruit, no JVD, supple, symmetrical, trachea midline, and thyroid  not enlarged, symmetric, no tenderness/mass/nodules Lungs: clear to auscultation bilaterally Heart: 2/6 outflow tract murmur consistent with aortic stenosis. Extremities: extremities normal, atraumatic, no cyanosis or edema Pulses: 2+ and symmetric Skin: Skin color, texture, turgor normal. No rashes or lesions Neurologic: Grossly normal  EKG EKG Interpretation Date/Time:  Tuesday September 09 2024 15:40:10 EST Ventricular Rate:  58 PR Interval:  214 QRS Duration:  88 QT Interval:  428 QTC Calculation: 420 R Axis:   -4  Text Interpretation: Sinus bradycardia with 1st degree A-V block Nonspecific ST abnormality When compared with ECG of 09-Jul-2023 14:12, No significant  change was found Confirmed by Court Carrier [phone]) on 09/09/2024 3:50:06 PM    ASSESSMENT AND PLAN:   Hyperlipidemia History of hyperlipidemia on Praluent  with lipid profile performed 06/01/2023 revealing total cholesterol 116, LDL 41 and HDL 47.  Essential hypertension History of essential hypertension blood pressure measured today at 110/70.  He is on losartan .  Coronary atherosclerosis History of CAD status post LAD diagonal branch stenting by myself 07/14/2003 with subsequent stenting by Dr. Lavon 07/31/2003.  He has not had any coronary issues since that time.  Peripheral vascular disease History of PAD status post left renal artery stenting by myself using a 6 mm x 15 mm long balloon expandable stent after CTA performed 02/04/2016 showed high-grade ostial left renal artery stenosis.  His follow-up renal Dopplers showed continued patency.  Aortic stenosis,  moderate History of moderate aortic stenosis with recent 2D echo performed/30/25 revealing normal LV systolic function with a aortic valve area of 0.84 cm with a mean gradient of 35 mmHg.  He is completely asymptomatic.  Will continue to follow this on an annual basis.     Carrier DOROTHA Court MD FACP,FACC,FAHA, [facility] 09/09/2024 4:03 PM

## 2024-09-09 NOTE — Assessment & Plan Note (Signed)
 History of essential hypertension blood pressure measured today at 110/70.  He is on losartan .

## 2024-09-09 NOTE — Assessment & Plan Note (Signed)
 History of hyperlipidemia on Praluent  with lipid profile performed 06/01/2023 revealing total cholesterol 116, LDL 41 and HDL 47.

## 2024-09-09 NOTE — Patient Instructions (Signed)
 Medication Instructions:  Your physician recommends that you continue on your current medications as directed. Please refer to the Current Medication list given to you today.  *If you need a refill on your cardiac medications before your next appointment, please call your pharmacy*  Testing/Procedures: Your physician has requested that you have an echocardiogram. Echocardiography is a painless test that uses sound waves to create images of your heart. It provides your doctor with information about the size and shape of your heart and how well your heart's chambers and valves are working. This procedure takes approximately one hour. There are no restrictions for this procedure. Please do NOT wear cologne, perfume, aftershave, or lotions (deodorant is allowed). Please arrive 15 minutes prior to your appointment time.  Please note: We ask at that you not bring children with you during ultrasound (echo/ vascular) testing. Due to room size and safety concerns, children are not allowed in the ultrasound rooms during exams. Our front office staff cannot provide observation of children in our lobby area while testing is being conducted. An adult accompanying a patient to their appointment will only be allowed in the ultrasound room at the discretion of the ultrasound technician under special circumstances. We apologize for any inconvenience. **To do in April**   Follow-Up: At [facility], you and your health needs are our priority.  As part of our continuing mission to provide you with exceptional heart care, our providers are all part of one team.  This team includes your primary Cardiologist (physician) and Advanced Practice Providers or APPs (Physician Assistants and Nurse Practitioners) who all work together to provide you with the care you need, when you need it.  Your next appointment:   12 month(s)  Provider:   Dorn Lesches, MD    We recommend signing up for the patient portal called  MyChart.  Sign up information is provided on this After Visit Summary.  MyChart is used to connect with patients for Virtual Visits (Telemedicine).  Patients are able to view lab/test results, encounter notes, upcoming appointments, etc.  Non-urgent messages can be sent to your provider as well.   To learn more about what you can do with MyChart, go to [email or web address].

## 2024-09-09 NOTE — Assessment & Plan Note (Signed)
 History of PAD status post left renal artery stenting by myself using a 6 mm x 15 mm long balloon expandable stent after CTA performed 02/04/2016 showed high-grade ostial left renal artery stenosis.  His follow-up renal Dopplers showed continued patency.

## 2024-09-09 NOTE — Assessment & Plan Note (Signed)
 History of CAD status post LAD diagonal branch stenting by myself 07/14/2003 with subsequent stenting by Dr. Lavon 07/31/2003.  He has not had any coronary issues since that time.

## 2025-01-07 ENCOUNTER — Ambulatory Visit (HOSPITAL_COMMUNITY)
